# Patient Record
Sex: Female | Born: 1960 | Race: Black or African American | Hispanic: No | Marital: Married | Smoking: Never smoker
Health system: Southern US, Community
[De-identification: ages and names within clinical notes are randomized; demographics above are authoritative.]

## PROBLEM LIST (undated history)

## (undated) DIAGNOSIS — Z9221 Personal history of antineoplastic chemotherapy: Secondary | ICD-10-CM

## (undated) DIAGNOSIS — Z91010 Allergy to peanuts: Secondary | ICD-10-CM

## (undated) DIAGNOSIS — T7840XA Allergy, unspecified, initial encounter: Secondary | ICD-10-CM

## (undated) DIAGNOSIS — R7303 Prediabetes: Secondary | ICD-10-CM

## (undated) DIAGNOSIS — R600 Localized edema: Secondary | ICD-10-CM

## (undated) DIAGNOSIS — M25471 Effusion, right ankle: Secondary | ICD-10-CM

## (undated) DIAGNOSIS — C801 Malignant (primary) neoplasm, unspecified: Secondary | ICD-10-CM

## (undated) DIAGNOSIS — E739 Lactose intolerance, unspecified: Secondary | ICD-10-CM

## (undated) DIAGNOSIS — E559 Vitamin D deficiency, unspecified: Secondary | ICD-10-CM

## (undated) DIAGNOSIS — K5900 Constipation, unspecified: Secondary | ICD-10-CM

## (undated) HISTORY — DX: Allergy to peanuts: Z91.010

## (undated) HISTORY — DX: Effusion, right ankle: M25.471

## (undated) HISTORY — PX: BREAST SURGERY: SHX581

## (undated) HISTORY — DX: Allergy, unspecified, initial encounter: T78.40XA

## (undated) HISTORY — DX: Localized edema: R60.0

## (undated) HISTORY — DX: Prediabetes: R73.03

## (undated) HISTORY — DX: Lactose intolerance, unspecified: E73.9

## (undated) HISTORY — DX: Malignant (primary) neoplasm, unspecified: C80.1

## (undated) HISTORY — PX: MASTECTOMY: SHX3

## (undated) HISTORY — DX: Vitamin D deficiency, unspecified: E55.9

## (undated) HISTORY — DX: Constipation, unspecified: K59.00

---

## 1995-03-17 HISTORY — PX: MASTECTOMY: SHX3

## 1999-02-04 ENCOUNTER — Encounter: Payer: Self-pay | Admitting: Oncology

## 1999-02-04 ENCOUNTER — Encounter: Admission: RE | Admit: 1999-02-04 | Discharge: 1999-02-04 | Payer: Self-pay | Admitting: Oncology

## 1999-02-18 ENCOUNTER — Other Ambulatory Visit: Admission: RE | Admit: 1999-02-18 | Discharge: 1999-02-18 | Payer: Self-pay | Admitting: Obstetrics and Gynecology

## 2000-07-15 ENCOUNTER — Encounter: Payer: Self-pay | Admitting: Oncology

## 2000-07-15 ENCOUNTER — Encounter: Admission: RE | Admit: 2000-07-15 | Discharge: 2000-07-15 | Payer: Self-pay | Admitting: Oncology

## 2000-08-04 ENCOUNTER — Other Ambulatory Visit: Admission: RE | Admit: 2000-08-04 | Discharge: 2000-08-04 | Payer: Self-pay | Admitting: Obstetrics and Gynecology

## 2000-09-08 ENCOUNTER — Other Ambulatory Visit: Admission: RE | Admit: 2000-09-08 | Discharge: 2000-09-08 | Payer: Self-pay | Admitting: Obstetrics and Gynecology

## 2000-09-08 ENCOUNTER — Encounter (INDEPENDENT_AMBULATORY_CARE_PROVIDER_SITE_OTHER): Payer: Self-pay

## 2001-09-27 ENCOUNTER — Encounter: Admission: RE | Admit: 2001-09-27 | Discharge: 2001-09-27 | Payer: Self-pay | Admitting: Oncology

## 2001-09-27 ENCOUNTER — Encounter: Payer: Self-pay | Admitting: Oncology

## 2002-01-24 ENCOUNTER — Emergency Department (HOSPITAL_COMMUNITY): Admission: EM | Admit: 2002-01-24 | Discharge: 2002-01-24 | Payer: Self-pay | Admitting: Emergency Medicine

## 2002-10-02 ENCOUNTER — Encounter: Payer: Self-pay | Admitting: Oncology

## 2002-10-02 ENCOUNTER — Encounter: Admission: RE | Admit: 2002-10-02 | Discharge: 2002-10-02 | Payer: Self-pay | Admitting: Oncology

## 2003-10-29 ENCOUNTER — Encounter: Admission: RE | Admit: 2003-10-29 | Discharge: 2003-10-29 | Payer: Self-pay | Admitting: Internal Medicine

## 2003-11-12 ENCOUNTER — Encounter: Admission: RE | Admit: 2003-11-12 | Discharge: 2003-11-12 | Payer: Self-pay | Admitting: Internal Medicine

## 2004-03-21 ENCOUNTER — Encounter: Admission: RE | Admit: 2004-03-21 | Discharge: 2004-03-21 | Payer: Self-pay | Admitting: Internal Medicine

## 2005-03-25 ENCOUNTER — Encounter: Admission: RE | Admit: 2005-03-25 | Discharge: 2005-03-25 | Payer: Self-pay | Admitting: Internal Medicine

## 2005-04-01 ENCOUNTER — Encounter: Admission: RE | Admit: 2005-04-01 | Discharge: 2005-04-01 | Payer: Self-pay | Admitting: Internal Medicine

## 2006-12-22 ENCOUNTER — Encounter: Admission: RE | Admit: 2006-12-22 | Discharge: 2006-12-22 | Payer: Self-pay | Admitting: Internal Medicine

## 2007-01-17 ENCOUNTER — Encounter: Admission: RE | Admit: 2007-01-17 | Discharge: 2007-01-17 | Payer: Self-pay | Admitting: Internal Medicine

## 2007-04-27 ENCOUNTER — Encounter: Admission: RE | Admit: 2007-04-27 | Discharge: 2007-04-27 | Payer: Self-pay | Admitting: Internal Medicine

## 2008-10-02 ENCOUNTER — Encounter: Admission: RE | Admit: 2008-10-02 | Discharge: 2008-10-02 | Payer: Self-pay | Admitting: Internal Medicine

## 2009-10-14 ENCOUNTER — Encounter: Admission: RE | Admit: 2009-10-14 | Discharge: 2009-10-14 | Payer: Self-pay | Admitting: Internal Medicine

## 2010-04-06 ENCOUNTER — Encounter: Payer: Self-pay | Admitting: Internal Medicine

## 2010-04-06 ENCOUNTER — Encounter: Payer: Self-pay | Admitting: Obstetrics & Gynecology

## 2010-09-15 ENCOUNTER — Other Ambulatory Visit: Payer: Self-pay | Admitting: Obstetrics & Gynecology

## 2010-09-15 DIAGNOSIS — Z780 Asymptomatic menopausal state: Secondary | ICD-10-CM

## 2010-09-17 ENCOUNTER — Emergency Department (HOSPITAL_COMMUNITY): Payer: BC Managed Care – PPO

## 2010-09-17 ENCOUNTER — Emergency Department (HOSPITAL_COMMUNITY)
Admission: EM | Admit: 2010-09-17 | Discharge: 2010-09-17 | Disposition: A | Discharge status: Home / Self Care | Payer: BC Managed Care – PPO | Attending: Emergency Medicine | Admitting: Emergency Medicine

## 2010-09-17 DIAGNOSIS — R110 Nausea: Secondary | ICD-10-CM | POA: Insufficient documentation

## 2010-09-17 DIAGNOSIS — Z853 Personal history of malignant neoplasm of breast: Secondary | ICD-10-CM | POA: Insufficient documentation

## 2010-09-17 DIAGNOSIS — R079 Chest pain, unspecified: Secondary | ICD-10-CM | POA: Insufficient documentation

## 2010-09-17 LAB — POCT I-STAT, CHEM 8
Calcium, Ion: 1.19 mmol/L (ref 1.12–1.32)
Chloride: 108 mEq/L (ref 96–112)
Glucose, Bld: 86 mg/dL (ref 70–99)
Potassium: 5.2 mEq/L — ABNORMAL HIGH (ref 3.5–5.1)

## 2010-09-17 LAB — DIFFERENTIAL
Basophils Absolute: 0 10*3/uL (ref 0.0–0.1)
Eosinophils Absolute: 0.1 10*3/uL (ref 0.0–0.7)
Eosinophils Relative: 2 % (ref 0–5)
Lymphocytes Relative: 36 % (ref 12–46)
Lymphs Abs: 2.3 10*3/uL (ref 0.7–4.0)
Monocytes Absolute: 0.7 10*3/uL (ref 0.1–1.0)
Monocytes Relative: 10 % (ref 3–12)
Neutro Abs: 3.4 10*3/uL (ref 1.7–7.7)

## 2010-09-17 LAB — CBC
HCT: 39.8 % (ref 36.0–46.0)
Platelets: 237 10*3/uL (ref 150–400)
RBC: 4.39 MIL/uL (ref 3.87–5.11)
RDW: 13.2 % (ref 11.5–15.5)

## 2010-09-17 LAB — CK TOTAL AND CKMB (NOT AT ARMC)
CK, MB: 1.3 ng/mL (ref 0.3–4.0)
Total CK: 90 U/L (ref 7–177)

## 2010-09-19 ENCOUNTER — Ambulatory Visit (HOSPITAL_COMMUNITY)
Admission: RE | Admit: 2010-09-19 | Discharge: 2010-09-19 | Disposition: A | Discharge status: Home / Self Care | Payer: BC Managed Care – PPO | Source: Ambulatory Visit | Attending: Obstetrics & Gynecology | Admitting: Obstetrics & Gynecology

## 2010-09-19 DIAGNOSIS — Z13820 Encounter for screening for osteoporosis: Secondary | ICD-10-CM | POA: Insufficient documentation

## 2010-09-19 DIAGNOSIS — Z780 Asymptomatic menopausal state: Secondary | ICD-10-CM

## 2010-09-22 ENCOUNTER — Other Ambulatory Visit: Payer: Self-pay | Admitting: Internal Medicine

## 2010-09-22 DIAGNOSIS — Z1231 Encounter for screening mammogram for malignant neoplasm of breast: Secondary | ICD-10-CM

## 2010-09-22 DIAGNOSIS — Z9012 Acquired absence of left breast and nipple: Secondary | ICD-10-CM

## 2010-10-17 ENCOUNTER — Ambulatory Visit: Payer: BC Managed Care – PPO

## 2010-10-24 ENCOUNTER — Ambulatory Visit
Admission: RE | Admit: 2010-10-24 | Discharge: 2010-10-24 | Disposition: A | Discharge status: Home / Self Care | Payer: BC Managed Care – PPO | Source: Ambulatory Visit | Attending: Internal Medicine | Admitting: Internal Medicine

## 2010-10-24 DIAGNOSIS — Z1231 Encounter for screening mammogram for malignant neoplasm of breast: Secondary | ICD-10-CM

## 2010-10-24 DIAGNOSIS — Z9012 Acquired absence of left breast and nipple: Secondary | ICD-10-CM

## 2011-10-15 ENCOUNTER — Other Ambulatory Visit: Payer: Self-pay | Admitting: Internal Medicine

## 2011-10-15 DIAGNOSIS — Z9012 Acquired absence of left breast and nipple: Secondary | ICD-10-CM

## 2011-10-15 DIAGNOSIS — Z1231 Encounter for screening mammogram for malignant neoplasm of breast: Secondary | ICD-10-CM

## 2011-11-03 ENCOUNTER — Ambulatory Visit: Payer: BC Managed Care – PPO

## 2011-11-13 ENCOUNTER — Ambulatory Visit
Admission: RE | Admit: 2011-11-13 | Discharge: 2011-11-13 | Disposition: A | Discharge status: Home / Self Care | Payer: BC Managed Care – PPO | Source: Ambulatory Visit | Attending: Internal Medicine | Admitting: Internal Medicine

## 2011-11-13 DIAGNOSIS — Z1231 Encounter for screening mammogram for malignant neoplasm of breast: Secondary | ICD-10-CM

## 2011-11-13 DIAGNOSIS — Z9012 Acquired absence of left breast and nipple: Secondary | ICD-10-CM

## 2012-12-28 ENCOUNTER — Ambulatory Visit: Payer: Self-pay | Admitting: Obstetrics & Gynecology

## 2013-05-10 IMAGING — CR DG CHEST 2V
2 series · 2 of 2 positions shown · non-contrast
Comparison: None.

CLINICAL DATA: Chest pain

CHEST - 2 VIEW

[w chest pa]
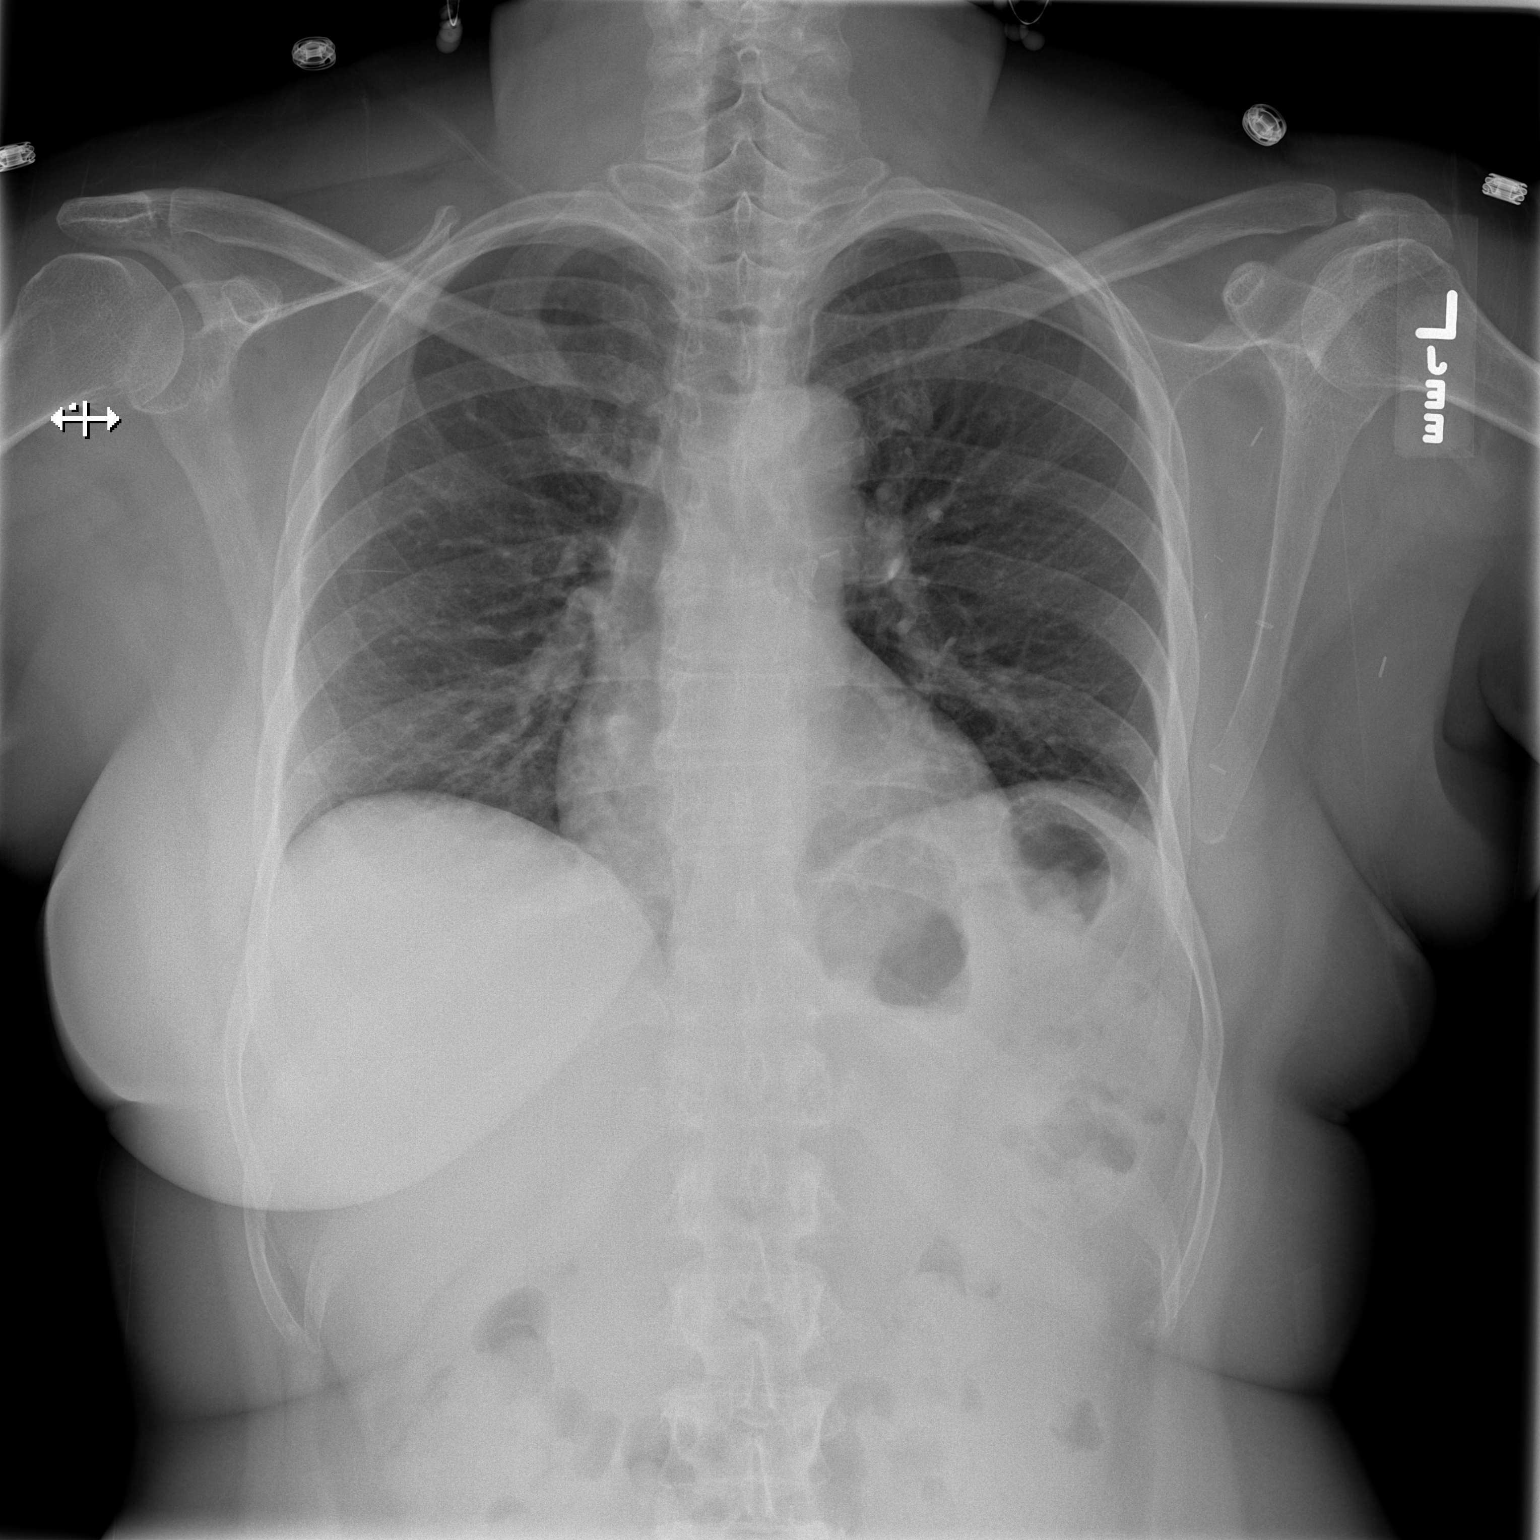

[w chest lat]
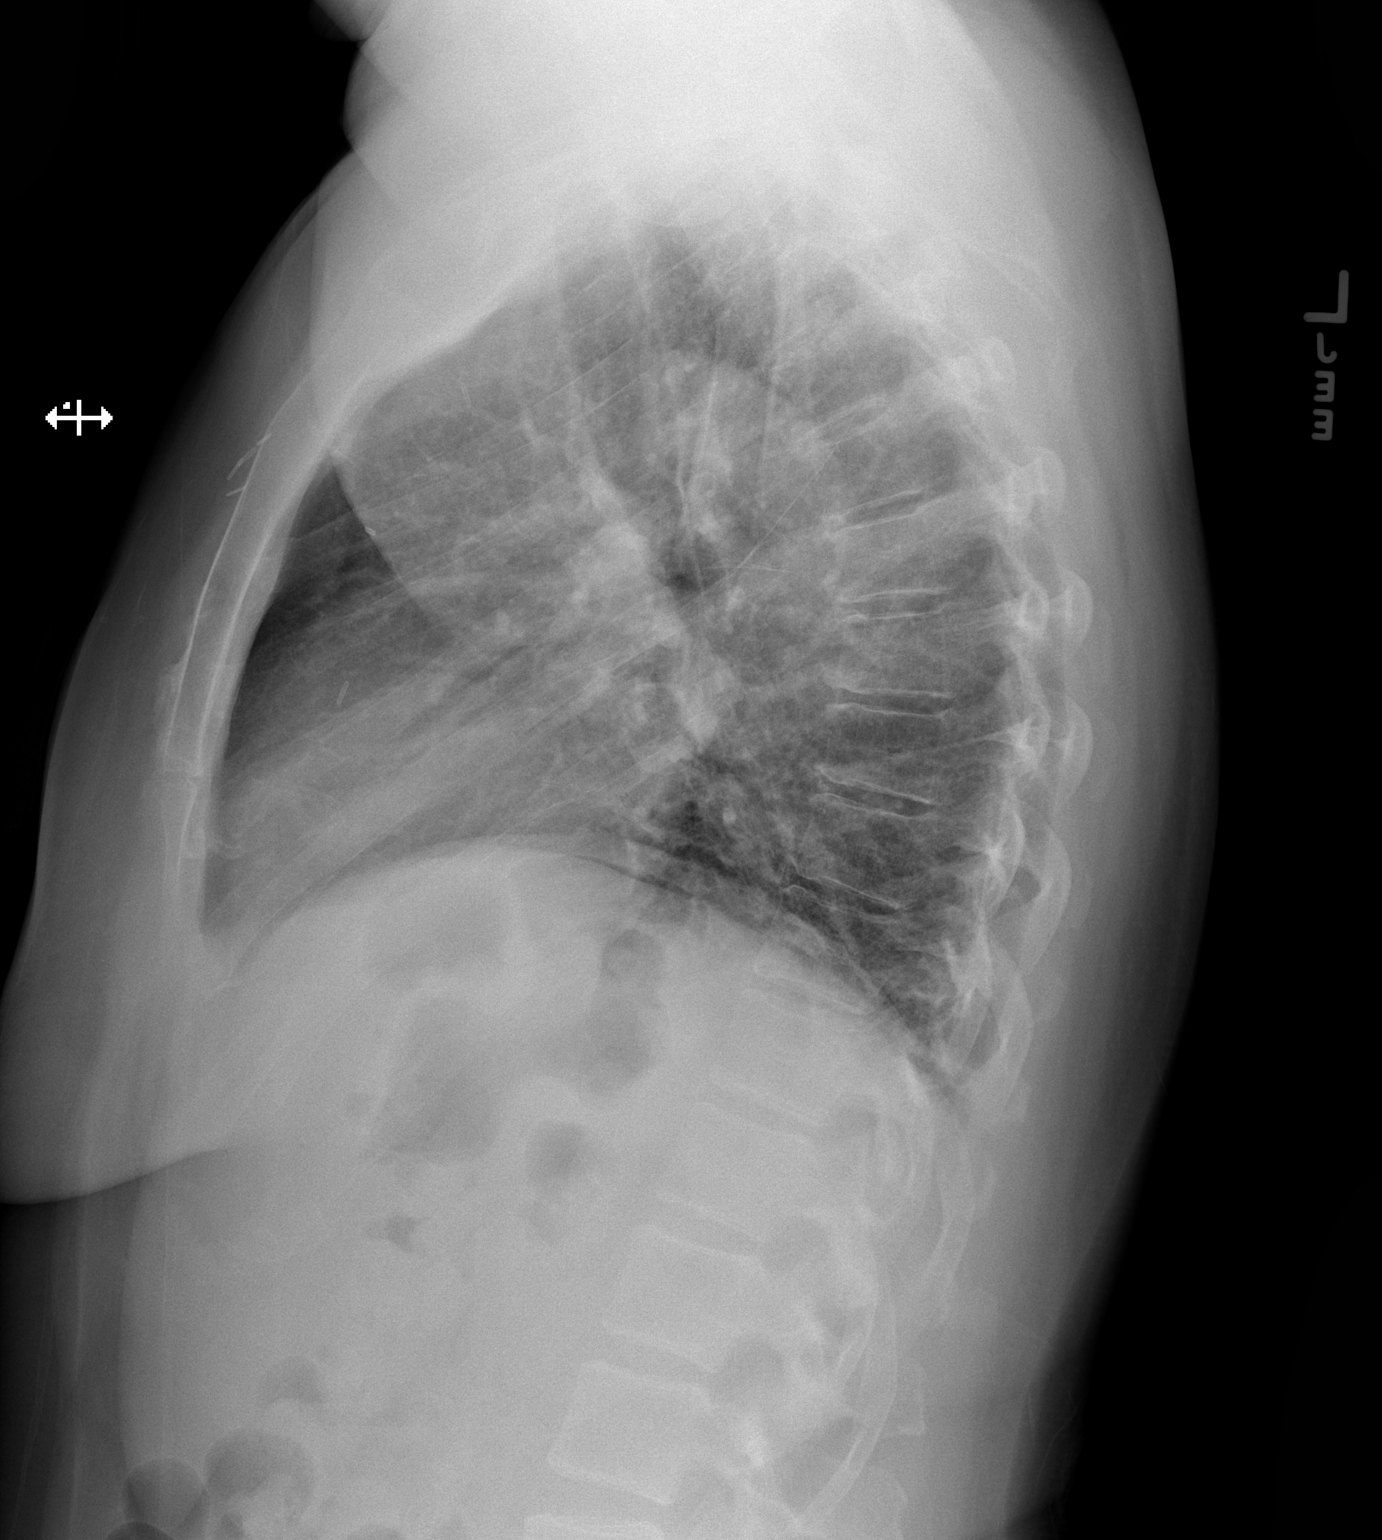

[2 of 2 positions shown; findings below may reference images not displayed]

FINDINGS: The patient is status post left mastectomy. The lungs are
clear without focal infiltrate, edema, pneumothorax or pleural
effusion. Cardiopericardial silhouette is at upper limits of normal
for size. Imaged bony structures of the thorax are intact.
IMPRESSION: There are no acute cardiopulmonary process.

## 2013-07-13 ENCOUNTER — Other Ambulatory Visit: Payer: Self-pay

## 2013-07-13 DIAGNOSIS — Z9012 Acquired absence of left breast and nipple: Secondary | ICD-10-CM

## 2013-07-13 DIAGNOSIS — Z1231 Encounter for screening mammogram for malignant neoplasm of breast: Secondary | ICD-10-CM

## 2013-07-18 ENCOUNTER — Ambulatory Visit
Admission: RE | Admit: 2013-07-18 | Discharge: 2013-07-18 | Disposition: A | Discharge status: Home / Self Care | Payer: BC Managed Care – PPO | Source: Ambulatory Visit

## 2013-07-18 ENCOUNTER — Encounter (INDEPENDENT_AMBULATORY_CARE_PROVIDER_SITE_OTHER): Payer: Self-pay

## 2013-07-18 DIAGNOSIS — Z1231 Encounter for screening mammogram for malignant neoplasm of breast: Secondary | ICD-10-CM

## 2013-07-18 DIAGNOSIS — Z9012 Acquired absence of left breast and nipple: Secondary | ICD-10-CM

## 2013-08-11 ENCOUNTER — Ambulatory Visit (INDEPENDENT_AMBULATORY_CARE_PROVIDER_SITE_OTHER): Payer: BC Managed Care – PPO | Admitting: Physician Assistant

## 2013-08-11 VITALS — BP 126/84 | HR 78 | Temp 98.3°F | Resp 16 | Ht 64.0 in | Wt 183.6 lb

## 2013-08-11 DIAGNOSIS — Z23 Encounter for immunization: Secondary | ICD-10-CM

## 2013-08-11 DIAGNOSIS — Z7189 Other specified counseling: Secondary | ICD-10-CM

## 2013-08-11 DIAGNOSIS — IMO0002 Reserved for concepts with insufficient information to code with codable children: Secondary | ICD-10-CM

## 2013-08-11 MED ORDER — TYPHOID VACCINE PO CPDR: 1.0000 | DELAYED_RELEASE_CAPSULE | ORAL | Status: DC

## 2013-08-11 NOTE — Progress Notes (Signed)
   Subjective:    Patient ID: Gloria Chen, female    DOB: 09-27-1960, 53 y.o.   MRN: [id number]  HPI   Gloria Chen is a very pleasant 53 yr old female here for travel immunizations.  She leaves for [place] on 6/13.  Traveling for work as a Pharmacist, hospital.  She does not have any immunization records with her.  She spoke with her PCP who recommended hep A and Tdap.  She is unsure about Heb B imm status but would like to begin this series.  She also agrees to the typhoid vaccine  She feels well today.  In her usual state of health.  No prior vaccine reactions.  Review of Systems  Constitutional: Negative.   Respiratory: Negative.   Cardiovascular: Negative.   Gastrointestinal: Negative.        Objective:   Physical Exam  Vitals reviewed. Constitutional: She is oriented to person, place, and time. She appears well-developed and well-nourished. No distress.  HENT:  Head: Normocephalic and atraumatic.  Eyes: Conjunctivae are normal. No scleral icterus.  Cardiovascular: Normal rate, regular rhythm and normal heart sounds.   Pulmonary/Chest: Effort normal and breath sounds normal. She has no wheezes. She has no rales.  Abdominal: Soft. There is no tenderness.  Neurological: She is alert and oriented to person, place, and time.  Skin: Skin is warm and dry.  Psychiatric: She has a normal mood and affect. Her behavior is normal.       Assessment & Plan:  Advice or immunization for travel - Plan: Hepatitis A vaccine adult IM, Hepatitis B vaccine adult IM, typhoid (VIVOTIF) DR capsule, Tdap vaccine greater than or equal to 7yo IM  Need for prophylactic vaccination and inoculation against viral hepatitis - Plan: Hepatitis A vaccine adult IM, Hepatitis B vaccine adult IM  Need for immunization against typhoid - Plan: typhoid (VIVOTIF) DR capsule  Need for Tdap vaccination - Plan: Tdap vaccine greater than or equal to 7yo IM   Gloria Chen is a very pleasant 53 yr old female here to discuss  immunizations prior to travel.  We have administered Tdap, Hep A, and first dose of Hep B.  Pt to return to complete Hep B series in 1 month and 6 months.  Encouraged her to get Hep A booster in 6-12 months.  Typhoid vaccine sent to pharmacy - encourage pt to start today so that she can be done 1 wk prior to travel which ensures the best protection.    Per CDC travel recommendations pt does not need malaria ppx or yellow fever vaccination   E. Natividad Brood MHS, PA-C [facility] 5/29/20155:16 PM

## 2013-08-11 NOTE — Patient Instructions (Signed)
Hepatitis A vaccine - first dose given today.  It is recommended that you get a booster in 6-12 months  Hepatitis B vaccine - first dose given today. This a 3 dose series.  Your second dose will be due in 1 month, and then the last dose will be in 6 months.  (You will not need an office visit for these, you can come in for immunization only)  Tdap - this offers protection for tetanus, diphtheria, and also boosts pertussis (whooping cough) immunity.  This lasts for 10 yrs (unless you have an injury like a cut, in which case we may want to boost it sooner)  Typhoid - take the Vivotif (typhoid vaccine) every other day for 4 doses.  I would start this today as you will have the best protection if it is completed 1 full week prior to travel.  This medicine has to stay refrigerated.   Traveling Outside the [place]  See your doctor at least 4 - 6 weeks before your trip. This allows time for immunizations to take effect. If it is less than 4 weeks before you leave, you should still see your caregiver. You might still benefit from shots or medicines and information about how to protect yourself while traveling.  Your caregiver will ask you where you intend to travel, how long you intend to stay, and whether you may visit rural areas. This determines what vaccinations should be considered. Know your travel schedule when you visit your caregiver.  Adolescents and children should seek guidance on their vaccination status from their caregiver. So should women who are breastfeeding or pregnant, and people with altered immunity (HIV/AIDS, diabetes). CDC RECOMMENDS THE FOLLOWING VACCINES (AS NEEDED BY AGE AND BY WORLD REGION):  Routine Vaccines: Be up to date on your routine vaccinations. Get boosters, if needed. These include diphtheria, tetanus, and pertussis (DPT), measles, mumps, and rubella (MMR), influenza (flu), and varicella (chickenpox). Also, meningococcal, if you are 37 to 51 years of age, and zoster  (shingles) if over age 52. Possibly pneumococcal, if you are a smoker or have long-term (chronic) lung or heart disease.  Typhoid: If you are visiting low income or developing countries.  Yellow Fever: If traveling to an area where the disease is prevalent (endemic).  HPV: (Human Papilloma Virus), if you are 58 years old or younger and intend to be sexually active.  Rabies: If you might be exposed to wild or domestic animals. Pre-exposure rabies vaccine is urged for people doing more than short-term travel in countries where rabies is common (including [place]).  Polio: A single one-time booster is advised for travel to [place] and [place].  Hepatitis A: This is routinely given to children beginning at age 82 years. It is often advised for most foreign travel, including [place].  Hepatitis B: This is given routinely to infants, children, and adolescents.  Meningococcal: This is advised for travel to developing countries, where risk is high. For example, parts of sub-Saharan [place] ("meningitis belt"). [place] requires vaccine for all pilgrims attending the Hajj (religious travel to [place]).  Malaria: A vaccine does not yet exist. Oral medicines can prevent the usual types of malaria and drug-resistant strains. The most common medicine prescribed is LARIAM (mefloquine). It is taken once weekly before, during, and after travel. Other drugs are also used for malaria prevention, including chloroquine and doxycycline.  Japanese B Encephalitis (JE): This is a moderately toxic vaccine. Use is generally limited to travelers to [place], who will have long rural  exposure to mosquitoes, in areas with high likelihood of disease spreading (such as, rice paddies). TO STAY HEALTHY, DO:  Wash hands often, with soap and water.  Drink only bottled or boiled water, or carbonated (bubbly) drinks in cans or bottles. Avoid tap water, fountain drinks, and ice cubes. If not possible, make water safer by BOTH  filtering through an "absolute 1-micron or less" filter AND adding iodine tablets. Such filters are found in camping and outdoor supply stores.  When buying carbonated drinks or bottled water, always inspect the bottle seal. Make sure it has not been previously opened. This could mean it was refilled with unclean beverages or water. If you suspect a bottle seal has been tampered with, return or discard it.  Eat only thoroughly cooked food from a reputable restaurant or food service provider, who routinely caters to foreign travelers. Only eat meat, fish, or shellfish that have been thoroughly cooked. Otherwise, they can infect you and cause gastroenteritis.  Avoid foods that have been prepared and left standing at room temperature. These often support bacterial growth that can make you ill.  If you will be visiting an area where there is risk for malaria, take your malaria prevention medicine before, during, and after travel, as directed. (See your doctor for a prescription.)  Protect yourself from mosquito bites:  Pay special attention to mosquito protection between dusk and dawn. This is when malaria-carrying mosquitos are active.  Wear long-sleeved shirts, long pants, and hats.  Use insect repellants that contain DEET (diethylmethyltoluamide).  Read and follow the directions and precautions on the product label.  Apply insect repellent to all exposed skin.  Do not put repellent on wounds or broken skin.  Do not breathe in, swallow, or get DEET in your eyes. DEET is toxic if swallowed. If using a spray product, apply DEET to your face by spraying your hands and rubbing the product carefully over the face. Avoid your eyes and mouth.  Purchase a bed net impregnated (treated) with the insecticide permethrin or deltamethrin. Or, spray the bed net with one of these insecticides. This is not needed if you are staying in air-conditioned or well-screened housing.  DEET may be used on adults,  children, and infants older than 25 months of age. Protect infants by using a carrier draped with mosquito netting, with an elastic edge for a tight fit.  Children under 41 years old should not apply insect repellent themselves. Do not apply to young children's hands or around their eyes and mouth.  If you are visiting areas where malaria occurs, read the malaria prevention recommendations on the CDC malaria website ([employer]). Your caregiver will guide you on the selection and use of an anti-malaria preventive medicine that you may need to take before, during, and after your visit.  To prevent fungal and parasitic infections, keep feet clean and dry. Do not go barefoot.  Always use condoms to reduce the risk of HIV and other sexually transmitted diseases.  Try to travel in vehicles that have seat belts, whenever possible. If renting a vehicle, try to rent a larger one for added protection. Wear helmets whenever bicycling or motorcycling. Avoid alcohol when operating any vehicle, even a bicycle. Avoid overcrowded, over-weighted, or top heavy buses or mini-vans. Be aware that pedestrian patterns vary greatly by country. TO AVOID GETTING SICK:  Do not eat food purchased from street vendors.  Eat at restaurants that often cater to foreign travelers (leading hotels, hotel chains).  Do not drink beverages with  ice.  Do not eat dairy products, unless you know they have been pasteurized.  Do not share needles with anyone.  Do not handle animals (especially monkeys, dogs, and cats). Avoid bites and serious diseases (including rabies and plague).  Do not swim in fresh water. Salt water is often safer. Avoid swimming pools that are not chlorinated.  Do not have unprotected sex. WHAT YOU NEED TO BRING WITH YOU:  Long-sleeved shirt, long pants, and a hat to wear outside. This is to prevent illnesses carried by insects (malaria, dengue, filariasis, leishmaniasis,  onchocerciasis).  Contact information card, for use in urgent situations. This should list the names, addresses, and telephone numbers of family member(s) or contact(s) in your country, your primary caregivers, important specialty home caregivers, area hospitals and clinics where you will travel, and your national consulate or embassy.  Purchase a pre-packaged travel health kit from a reputable source, or create one yourself. This should include a first aid kit and commonly needed medicines. ITEMS TO INCLUDE IN A TRAVEL HEALTH KIT:  Insect repellent containing DEET.  Bed nets impregnated with permethrin. (Can be purchased in camping or [employer] supply stores. Overseas, permethrin or another insecticide, deltamethrin, may be purchased to treat bed nets and clothes.)  Flying-insect spray or mosquito coils, to help clear rooms of mosquitoes. The product should contain a pyrethroid insecticide. These insecticides quickly kill flying insects, including mosquitoes.  Over-the-counter anti-diarrhea medicine, to take if you have diarrhea.  Iodine tablets and water filters to purify water, if bottled water is not available.  Sunblock and sunglasses.  Antibacterial hand wipes or an alcohol-based hand sanitizer. Must contain at least 60% alcohol.  Extra pair of contacts or prescription glasses, or both, for people who wear corrective lenses.  Prescription medicines. Make sure you have enough to last during your trip, as well as a copy of the prescription(s).  Destination-related medicines, if applicable:  Anti-malaria medicines.  Medicine to prevent or treat high-altitude illness.  Pain or fever medicines (acetaminophen, aspirin, ibuprofen).  Stomach upset or diarrhea medicines:  Over-the-counter anti-diarrhea medicine (loperamide, bismuth subsalicylate).  Antibiotic for self-treatment of moderate to severe diarrhea.  Oral rehydration solution packets.  Mild  laxative.  Antacid.  Items to treat throat and respiratory symptoms:  Antihistamine.  Decongestant, alone or combined with antihistamine.  Cough suppressant or expectorant (promotes the expulsion of mucus).  Throat lozenges.  Anti-motion sickness medicine.  Epinephrine auto-injector (such as an EpiPen), if you have a history of severe allergic reaction. Smaller dose packages are available for children.  Any medicines, prescription or over-the-counter, taken on a regular basis at home. For Basic First Aid  Disposable gloves (at least two pairs).  Adhesive bandages, multiple sizes.  Gauze.  Adhesive tape.  Elastic bandage wrap for sprains and strains.  Antiseptic.  Cotton swabs.  Tweezers.  Scissors.  Antifungal and antibacterial ointments or creams.  1% hydrocortisone cream.  Anti-itch gel or cream, for insect bites and stings.  Aloe gel for sunburns.  Moleskin or molefoam for blisters.  Digital thermometer.  Saline eye drops.  First-aid quick reference card.  Commercial suture and syringe kits, to be used by a local caregiver. (These items will also require a letter from the prescribing physician, on official letterhead stationery.) Note that some of the above items are considered sharp. They will need to be packed in your checked luggage, not in a carry on.  AFTER YOU RETURN HOME: If you have visited a malaria risk area, continue taking your anti-malaria  drug for 4 weeks (chloroquine, doxycycline, or mefloquine) or 7 days (atovaquone/proguanil) after leaving the risk area. Malaria is always a serious disease and may be a deadly illness. If you become ill with a fever or flu-like illness, while traveling or after you return home (for up to 1 year), you should seek immediate medical attention. Tell the caregiver your travel history. This information is courtesy of the Center for Disease Control (CDC).  Document Released: 02/12/2004 Document Revised:  05/25/2011 Document Reviewed: 12/31/2008 [facility] Patient Information 2014 [place].

## 2013-08-21 ENCOUNTER — Telehealth: Payer: Self-pay | Admitting: Physician Assistant

## 2013-08-21 DIAGNOSIS — IMO0002 Reserved for concepts with insufficient information to code with codable children: Secondary | ICD-10-CM

## 2013-08-21 DIAGNOSIS — Z23 Encounter for immunization: Secondary | ICD-10-CM

## 2013-08-21 NOTE — Telephone Encounter (Signed)
Patient has a question about her vaccinations. Patients says it is urgent to get a call back since she is leaving to go abroad on Saturday.  [phone] or [phone] ext. [place]

## 2013-08-22 MED ORDER — TYPHOID VACCINE PO CPDR: 1.0000 | DELAYED_RELEASE_CAPSULE | ORAL | Status: DC

## 2013-08-22 NOTE — Telephone Encounter (Signed)
Pt states on the last day of taking the Typhoid medication pt became ill and vomited the medication out. She is wondering if she should have another pill prescribed or if the 3 days will be enough.   Ok to leave message on voicemail.

## 2013-08-22 NOTE — Telephone Encounter (Signed)
The typhoid vaccine is most effective when completed one full week prior to travel, so she will not have full protection by the time she travels.  However, some protection is better than no protection.  As far as the missed dose, I think she will be ok to just repeat the last dose.  I have sent an additional dose of the vaccine to her pharmacy.  However, I think that the vaccine comes pre-packaged as 4 doses, so it's possible that the pharmacy may not be able to give her just one pill and she may have to buy all four doses - but I can't say for sure that this will be the case

## 2013-08-22 NOTE — Telephone Encounter (Signed)
Spoke to pt- she is not going to take the final dose- pharmacy does require the 4 pills to be purchased together.

## 2013-10-18 ENCOUNTER — Ambulatory Visit (INDEPENDENT_AMBULATORY_CARE_PROVIDER_SITE_OTHER): Payer: BC Managed Care – PPO | Admitting: Obstetrics & Gynecology

## 2013-10-18 ENCOUNTER — Encounter: Payer: Self-pay | Admitting: Obstetrics & Gynecology

## 2013-10-18 VITALS — BP 138/94 | HR 80 | Temp 98.1°F | Ht 63.0 in | Wt 185.0 lb

## 2013-10-18 DIAGNOSIS — Z0000 Encounter for general adult medical examination without abnormal findings: Secondary | ICD-10-CM

## 2013-10-18 DIAGNOSIS — Z01419 Encounter for gynecological examination (general) (routine) without abnormal findings: Secondary | ICD-10-CM

## 2013-10-18 DIAGNOSIS — B373 Candidiasis of vulva and vagina: Secondary | ICD-10-CM

## 2013-10-18 DIAGNOSIS — B3731 Acute candidiasis of vulva and vagina: Secondary | ICD-10-CM

## 2013-10-18 LAB — POCT WET PREP (WET MOUNT)
Clue Cells Wet Prep Whiff POC: NEGATIVE
KOH Wet Prep POC: NEGATIVE

## 2013-10-18 MED ORDER — FLUCONAZOLE 150 MG PO TABS: 150.0000 mg | ORAL_TABLET | ORAL | Status: DC

## 2013-10-18 NOTE — Patient Instructions (Signed)

## 2013-10-18 NOTE — Progress Notes (Signed)
Subjective:     Gloria Chen is a 53 y.o. female here for a routine exam.  Current complaints: none.    Personal health questionnaire:  Is patient Ashkenazi Jewish, have a family history of breast and/or ovarian cancer: yes--personal h/o breast cancer Is there a family history of uterine cancer diagnosed at age < 12, gastrointestinal cancer, urinary tract cancer, family member who is a Field seismologist syndrome-associated carrier: no Is the patient overweight and hypertensive, family history of diabetes, personal history of gestational diabetes or PCOS: no Is patient over 53, have PCOS,  family history of premature CHD under age 109, diabetes, smoke, have hypertension or peripheral artery disease:  no At any time, has a partner hit, kicked or otherwise hurt or frightened you?: no Over the past 2 weeks, have you felt down, depressed or hopeless?: no Over the past 2 weeks, have you felt little interest or pleasure in doing things?:no   Gynecologic History No LMP recorded. Patient is postmenopausal.  Last Pap results were: normal   Obstetric History OB History  Gravida Para Term Preterm AB SAB TAB Ectopic Multiple Living  2 1 1  1     1     # Outcome Date GA Lbr Len/2nd Weight Sex Delivery Anes PTL Lv  2 TRM         Y  1 ABT               Past Medical History  Diagnosis Date  . Allergy   . Cancer     Past Surgical History  Procedure Laterality Date  . Breast surgery      Current outpatient prescriptions:fluconazole (DIFLUCAN) 150 MG tablet, Take 1 tablet (150 mg total) by mouth every other day., Disp: 2 tablet, Rfl: 0 No Known Allergies  History  Substance Use Topics  . Smoking status: Never Smoker   . Smokeless tobacco: Not on file  . Alcohol Use: No    Family History  Problem Relation Age of Onset  . Hypertension Mother   . Heart disease Father   . Hyperlipidemia Father   . Hypertension Father   . Stroke Father   . Hypertension Sister   . Diabetes Sister   . Hypertension  Brother       Review of Systems  Constitutional: negative for fatigue and weight loss Respiratory: negative for cough and wheezing Cardiovascular: negative for chest pain, fatigue and palpitations Gastrointestinal: negative for abdominal pain and change in bowel habits Musculoskeletal:negative for myalgias Neurological: negative for gait problems and tremors Behavioral/Psych: negative for abusive relationship, depression Endocrine: negative for temperature intolerance   Genitourinary:negative for genital lesions, hot flashes, sexual problems and vaginal discharge Integument/breast: negative for breast lump, breast tenderness, nipple discharge and skin lesion(s)    Objective:       BP 138/94  Pulse 80  Temp(Src) 98.1 F (36.7 C)  Ht 5\' 3"  (1.6 m)  Wt 83.915 kg (185 lb)  BMI 32.78 kg/m2 General:   alert  Skin:   no rash or abnormalities  Lungs:   clear to auscultation bilaterally  Heart:   regular rate and rhythm, S1, S2 normal, no murmur, click, rub or gallop  Abdomen:  normal findings: no organomegaly, soft, non-tender and no hernia  Pelvis:  External genitalia: normal general appearance Urinary system: urethral meatus normal and bladder without fullness, nontender Vaginal: normal without tenderness, induration or masses; white discharge present Cervix: normal appearance Adnexa: normal bimanual exam Uterus: anteverted and non-tender, normal size   Lab  Review  Labs reviewed no Radiologic studies reviewed no    Assessment:    Healthy female exam.  Likely candida vulvovaginitis   Plan:    Education reviewed: calcium supplements, low fat, low cholesterol diet and weight bearing exercise.   Meds ordered this encounter  Medications  . fluconazole (DIFLUCAN) 150 MG tablet    Sig: Take 1 tablet (150 mg total) by mouth every other day.    Dispense:  2 tablet    Refill:  0   Orders Placed This Encounter  Procedures  . WET PREP BY MOLECULAR PROBE    Follow up  as needed.

## 2013-10-19 LAB — PAP IG AND HPV HIGH-RISK: HPV DNA High Risk: NOT DETECTED

## 2013-10-19 LAB — WET PREP BY MOLECULAR PROBE
Candida species: NEGATIVE
Gardnerella vaginalis: NEGATIVE
Trichomonas vaginosis: NEGATIVE

## 2014-01-15 ENCOUNTER — Encounter: Payer: Self-pay | Admitting: Obstetrics & Gynecology

## 2014-03-12 ENCOUNTER — Encounter: Payer: Self-pay | Admitting: *Deleted

## 2014-03-13 ENCOUNTER — Encounter: Payer: Self-pay | Admitting: Obstetrics & Gynecology

## 2014-10-17 ENCOUNTER — Ambulatory Visit (INDEPENDENT_AMBULATORY_CARE_PROVIDER_SITE_OTHER): Payer: BC Managed Care – PPO | Admitting: Physician Assistant

## 2014-10-17 DIAGNOSIS — L089 Local infection of the skin and subcutaneous tissue, unspecified: Secondary | ICD-10-CM

## 2014-10-17 DIAGNOSIS — L723 Sebaceous cyst: Secondary | ICD-10-CM | POA: Diagnosis not present

## 2014-10-17 DIAGNOSIS — Z853 Personal history of malignant neoplasm of breast: Secondary | ICD-10-CM | POA: Insufficient documentation

## 2014-10-17 MED ORDER — DOXYCYCLINE HYCLATE 100 MG PO TABS: 100.0000 mg | ORAL_TABLET | Freq: Two times a day (BID) | ORAL | Status: DC

## 2014-10-17 NOTE — Patient Instructions (Signed)
Warm compresses to the area.  Change the dressing everyday.  Recheck with me on Friday.

## 2014-10-17 NOTE — Progress Notes (Signed)
   Gloria Chen  MRN: [id number] DOB: 1961/01/11  Subjective:  Pt presents to clinic with an enlarging tender bump on her left inner thigh. She has had a small bump in that area for quite a while and then about a week ago she noticed that it was getting bigger and becoming painful.  She has had a sebaceous cyst removed in the past but it never hurt.  She overall feels fine.  Patient Active Problem List   Diagnosis Date Noted  . History of breast cancer in female 10/17/2014    No current outpatient prescriptions on file prior to visit.   No current facility-administered medications on file prior to visit.   No Known Allergies  Review of Systems  Constitutional: Negative for fever and chills.  Skin: Positive for wound.   Objective:  BP 122/76 mmHg  Pulse 89  Temp(Src) 98.7 F (37.1 C) (Oral)  Resp 16  Ht 5' 4.5" (1.638 m)  Wt 181 lb 3.2 oz (82.192 kg)  BMI 30.63 kg/m2  SpO2 98%  Physical Exam  Constitutional: She is oriented to person, place, and time and well-developed, well-nourished, and in no distress.  HENT:  Head: Normocephalic and atraumatic.  Right Ear: Hearing and external ear normal.  Left Ear: Hearing and external ear normal.  Eyes: Conjunctivae are normal.  Neck: Normal range of motion.  Pulmonary/Chest: Effort normal.  Neurological: She is alert and oriented to person, place, and time. Gait normal.  Skin: Skin is warm and dry.     Psychiatric: Mood, memory, affect and judgment normal.  Vitals reviewed.  Procedure:  Consent obtained.  Local anesthesia with 2% lido with epi.  Betadine prep. #11 blade used to make 2cm incision - purulent malodorous sebaceous material expressed - cyst capsule expressed from the wound cavity.  Wound cavity was packed with with 1/4 in plain packing - drsg placed.  Assessment and Plan :  Infected sebaceous cyst - Plan: doxycycline (VIBRA-TABS) 100 MG tablet   Wound care was d/w pt.  Her questions were answered.  After the  procedure she felt much better.  Windell Hummingbird PA-C  [facility] 10/17/2014 11:28 AM

## 2014-10-19 ENCOUNTER — Ambulatory Visit (INDEPENDENT_AMBULATORY_CARE_PROVIDER_SITE_OTHER): Payer: BC Managed Care – PPO | Admitting: Physician Assistant

## 2014-10-19 VITALS — BP 112/72 | HR 74 | Temp 98.1°F | Resp 18 | Wt 183.0 lb

## 2014-10-19 DIAGNOSIS — L723 Sebaceous cyst: Secondary | ICD-10-CM

## 2014-10-19 DIAGNOSIS — L089 Local infection of the skin and subcutaneous tissue, unspecified: Secondary | ICD-10-CM

## 2014-10-19 NOTE — Progress Notes (Signed)
   Gloria Chen  MRN: [id number] DOB: 04/07/1960  Subjective:  Pt presents to clinic for a wound recheck. She has been taking her abx as Rx and not having any problems with it.  She has been changing the drsg daily.  Quite a bit of drainage from the area.    Patient Active Problem List   Diagnosis Date Noted  . History of breast cancer in female 10/17/2014    Current Outpatient Prescriptions on File Prior to Visit  Medication Sig Dispense Refill  . doxycycline (VIBRA-TABS) 100 MG tablet Take 1 tablet (100 mg total) by mouth 2 (two) times daily. 20 tablet 0   No current facility-administered medications on file prior to visit.    No Known Allergies  Review of Systems Objective:  BP 112/72 mmHg  Pulse 74  Temp(Src) 98.1 F (36.7 C) (Oral)  Resp 18  Wt 183 lb (83.008 kg)  SpO2 96%  Physical Exam  Constitutional: She is oriented to person, place, and time and well-developed, well-nourished, and in no distress.  HENT:  Head: Normocephalic and atraumatic.  Right Ear: Hearing and external ear normal.  Left Ear: Hearing and external ear normal.  Eyes: Conjunctivae are normal.  Neck: Normal range of motion.  Pulmonary/Chest: Effort normal.  Neurological: She is alert and oriented to person, place, and time. Gait normal.  Skin: Skin is warm and dry.  Drsg and packing removed.  Wound cavity is health appearing with good granulation tissue.  The induration is reduced as is the erythema - the induration is now about 3 cm in diameter.  No purulence was expressed. Irrigated with lido with epi and then repacked with plain packing.  Drsg placed.  Psychiatric: Mood, memory, affect and judgment normal.  Vitals reviewed.  Assessment and Plan :  Infected sebaceous cyst   Pt to continue the abx.  She will pull about 1 in of packing out in 2 days and then f/u with me in 5 days.    Windell Hummingbird PA-C  [facility] 10/19/2014 5:06 PM

## 2014-10-19 NOTE — Patient Instructions (Signed)
Daily dressing change Pull a small amount about an inch of the packing out in 2 days and then I will see you on Wed or Thursday

## 2014-10-22 ENCOUNTER — Ambulatory Visit: Payer: BC Managed Care – PPO | Admitting: Obstetrics & Gynecology

## 2014-10-25 ENCOUNTER — Ambulatory Visit (INDEPENDENT_AMBULATORY_CARE_PROVIDER_SITE_OTHER): Payer: BC Managed Care – PPO | Admitting: Physician Assistant

## 2014-10-25 ENCOUNTER — Ambulatory Visit: Payer: BC Managed Care – PPO | Admitting: Certified Nurse Midwife

## 2014-10-25 VITALS — BP 116/74 | HR 84 | Temp 97.7°F | Resp 14 | Ht 64.5 in | Wt 184.0 lb

## 2014-10-25 DIAGNOSIS — L089 Local infection of the skin and subcutaneous tissue, unspecified: Secondary | ICD-10-CM

## 2014-10-25 DIAGNOSIS — L723 Sebaceous cyst: Secondary | ICD-10-CM

## 2014-10-25 NOTE — Progress Notes (Signed)
   Gloria Chen  MRN: [id number] DOB: Sep 25, 1960  Subjective:  Pt presents to clinic for a wound recheck.  She has been changing the drsg daily.  She is tolerating the abx ok and she is having no problems.  Patient Active Problem List   Diagnosis Date Noted  . History of breast cancer in female 10/17/2014    Current Outpatient Prescriptions on File Prior to Visit  Medication Sig Dispense Refill  . doxycycline (VIBRA-TABS) 100 MG tablet Take 1 tablet (100 mg total) by mouth 2 (two) times daily. 20 tablet 0   No current facility-administered medications on file prior to visit.    No Known Allergies  Review of Systems  Constitutional: Negative for fever and chills.  Skin: Positive for wound.   Objective:  BP 116/74 mmHg  Pulse 84  Temp(Src) 97.7 F (36.5 C) (Oral)  Resp 14  Ht 5' 4.5" (1.638 m)  Wt 184 lb (83.462 kg)  BMI 31.11 kg/m2  SpO2 98%  Physical Exam  Constitutional: She is oriented to person, place, and time and well-developed, well-nourished, and in no distress.  HENT:  Head: Normocephalic and atraumatic.  Right Ear: Hearing and external ear normal.  Left Ear: Hearing and external ear normal.  Eyes: Conjunctivae are normal.  Neck: Normal range of motion.  Pulmonary/Chest: Effort normal.  Neurological: She is alert and oriented to person, place, and time. Gait normal.  Skin: Skin is warm and dry.  Packing removed - no purulence expressed - minimal induration along the incision border - there is no erythema and no TTP - good granulation tissue present in the wound cavity  Psychiatric: Mood, memory, affect and judgment normal.  Vitals reviewed.   Assessment and Plan :  Infected sebaceous cyst   Finish abx - keep covered until skin is healed over.  F/u only if needed.  Windell Hummingbird PA-C  [facility] 10/25/2014 7:13 PM

## 2014-10-29 NOTE — Progress Notes (Signed)
  Medical screening examination/treatment/procedure(s) were performed by non-physician practitioner and as supervising physician I was immediately available for consultation/collaboration.     

## 2014-11-01 ENCOUNTER — Ambulatory Visit: Payer: BC Managed Care – PPO | Admitting: Obstetrics & Gynecology

## 2015-09-20 ENCOUNTER — Ambulatory Visit: Payer: BC Managed Care – PPO | Admitting: Certified Nurse Midwife

## 2015-09-26 ENCOUNTER — Other Ambulatory Visit: Payer: Self-pay | Admitting: Internal Medicine

## 2015-09-26 DIAGNOSIS — Z1231 Encounter for screening mammogram for malignant neoplasm of breast: Secondary | ICD-10-CM

## 2015-10-03 ENCOUNTER — Other Ambulatory Visit: Payer: Self-pay | Admitting: Nurse Practitioner

## 2015-10-03 DIAGNOSIS — N6313 Unspecified lump in the right breast, lower outer quadrant: Secondary | ICD-10-CM

## 2015-10-08 ENCOUNTER — Ambulatory Visit
Admission: RE | Admit: 2015-10-08 | Discharge: 2015-10-08 | Disposition: A | Discharge status: Home / Self Care | Payer: BC Managed Care – PPO | Source: Ambulatory Visit | Attending: Internal Medicine | Admitting: Internal Medicine

## 2015-10-08 DIAGNOSIS — N6313 Unspecified lump in the right breast, lower outer quadrant: Secondary | ICD-10-CM

## 2016-02-17 LAB — HM COLONOSCOPY

## 2017-01-20 ENCOUNTER — Ambulatory Visit
Admission: RE | Admit: 2017-01-20 | Discharge: 2017-01-20 | Disposition: A | Discharge status: Home / Self Care | Payer: BC Managed Care – PPO | Source: Ambulatory Visit | Attending: Internal Medicine | Admitting: Internal Medicine

## 2017-01-20 ENCOUNTER — Other Ambulatory Visit: Payer: Self-pay | Admitting: Internal Medicine

## 2017-01-20 DIAGNOSIS — Z1231 Encounter for screening mammogram for malignant neoplasm of breast: Secondary | ICD-10-CM

## 2017-01-20 HISTORY — DX: Personal history of antineoplastic chemotherapy: Z92.21

## 2017-04-05 ENCOUNTER — Other Ambulatory Visit: Payer: Self-pay | Admitting: Obstetrics and Gynecology

## 2017-04-05 DIAGNOSIS — R922 Inconclusive mammogram: Secondary | ICD-10-CM

## 2017-04-05 DIAGNOSIS — R9230 Dense breasts, unspecified: Secondary | ICD-10-CM

## 2017-04-08 ENCOUNTER — Other Ambulatory Visit: Payer: Self-pay

## 2017-04-08 ENCOUNTER — Other Ambulatory Visit: Payer: Self-pay | Admitting: Obstetrics and Gynecology

## 2017-04-08 DIAGNOSIS — R922 Inconclusive mammogram: Secondary | ICD-10-CM

## 2017-04-08 DIAGNOSIS — R9230 Dense breasts, unspecified: Secondary | ICD-10-CM

## 2017-04-09 ENCOUNTER — Other Ambulatory Visit: Payer: BC Managed Care – PPO

## 2017-04-12 ENCOUNTER — Other Ambulatory Visit: Payer: BC Managed Care – PPO

## 2017-04-29 ENCOUNTER — Ambulatory Visit
Admission: RE | Admit: 2017-04-29 | Discharge: 2017-04-29 | Disposition: A | Discharge status: Home / Self Care | Payer: BC Managed Care – PPO | Source: Ambulatory Visit | Attending: Obstetrics and Gynecology | Admitting: Obstetrics and Gynecology

## 2017-04-29 DIAGNOSIS — R922 Inconclusive mammogram: Secondary | ICD-10-CM

## 2018-02-23 ENCOUNTER — Encounter: Payer: Self-pay | Admitting: Nurse Practitioner

## 2018-02-23 ENCOUNTER — Ambulatory Visit (INDEPENDENT_AMBULATORY_CARE_PROVIDER_SITE_OTHER): Payer: BC Managed Care – PPO | Admitting: Nurse Practitioner

## 2018-02-23 VITALS — BP 138/78 | HR 84 | Temp 98.6°F | Ht 64.5 in | Wt 185.0 lb

## 2018-02-23 DIAGNOSIS — J209 Acute bronchitis, unspecified: Secondary | ICD-10-CM | POA: Insufficient documentation

## 2018-02-23 DIAGNOSIS — J40 Bronchitis, not specified as acute or chronic: Secondary | ICD-10-CM

## 2018-02-23 MED ORDER — PREDNISONE 10 MG (21) PO TBPK: ORAL_TABLET | ORAL | 0 refills | Status: DC

## 2018-02-23 MED ORDER — BENZONATATE 100 MG PO CAPS: 100.0000 mg | ORAL_CAPSULE | Freq: Four times a day (QID) | ORAL | 1 refills | Status: DC | PRN

## 2018-02-23 NOTE — Progress Notes (Signed)
  Subjective:     Patient ID: Gloria Chen , female    DOB: [birth year] , 57 y.o.   MRN: [id number]   Chief Complaint  Patient presents with  . Cough    2 weeks     HPI  Cough  This is a new problem. The current episode started 1 to 4 weeks ago. The problem has been gradually worsening. The problem occurs constantly. The cough is non-productive. Pertinent negatives include no chest pain, chills, fever, nasal congestion, sore throat or shortness of breath. The symptoms are aggravated by other. She has tried OTC cough suppressant for the symptoms. There is no history of asthma or bronchitis.     Past Medical History:  Diagnosis Date  . Allergy   . Cancer ([place])   . Personal history of chemotherapy      Family History  Problem Relation Age of Onset  . Hypertension Mother   . Heart disease Father   . Hyperlipidemia Father   . Hypertension Father   . Stroke Father   . Hypertension Sister   . Diabetes Sister   . Hypertension Brother   . Breast cancer Maternal Aunt     No current outpatient medications on file.   No Known Allergies   Review of Systems  Constitutional: Negative for chills and fever.  HENT: Negative for sore throat.   Respiratory: Positive for cough. Negative for shortness of breath.   Cardiovascular: Negative for chest pain.  Neurological: Negative.      Today's Vitals   02/23/18 1509  BP: 138/78  Pulse: 84  Temp: 98.6 F (37 C)  TempSrc: Oral  SpO2: 90%  Weight: 185 lb (83.9 kg)  Height: 5' 4.5" (1.638 m)   Body mass index is 31.26 kg/m.   Objective:  Physical Exam Constitutional:      Appearance: She is well-developed and well-nourished.  Cardiovascular:     Rate and Rhythm: Normal rate and regular rhythm.     Heart sounds: Normal heart sounds, S1 normal and S2 normal.  Pulmonary:     Breath sounds: Wheezing present.     Comments: Slightly labored breathing Chest:     Chest wall: No tenderness.  Neurological:     Mental Status: She is  alert and oriented to person, place, and time.       Assessment And Plan:   1. Bronchitis  2 week history of cough getting worse  Hacking cough noted and slightly labored breathing - predniSONE (STERAPRED UNI-PAK 21 TAB) 10 MG (21) TBPK tablet; Take as directed. (Patient not taking: Reported on 03/02/2018)  Dispense: 21 tablet; Refill: 0 - benzonatate (TESSALON PERLES) 100 MG capsule; Take 1 capsule (100 mg total) by mouth every 6 (six) hours as needed for cough. (Patient not taking: Reported on 03/02/2018)  Dispense: 30 capsule; Refill: [place], FNP

## 2018-03-02 ENCOUNTER — Encounter: Payer: BC Managed Care – PPO | Admitting: Internal Medicine

## 2018-03-02 ENCOUNTER — Ambulatory Visit (INDEPENDENT_AMBULATORY_CARE_PROVIDER_SITE_OTHER): Payer: BC Managed Care – PPO | Admitting: Internal Medicine

## 2018-03-02 ENCOUNTER — Encounter: Payer: Self-pay | Admitting: Internal Medicine

## 2018-03-02 VITALS — BP 122/76 | HR 75 | Temp 98.1°F | Ht 62.5 in | Wt 179.0 lb

## 2018-03-02 DIAGNOSIS — Z0000 Encounter for general adult medical examination without abnormal findings: Secondary | ICD-10-CM

## 2018-03-02 DIAGNOSIS — J40 Bronchitis, not specified as acute or chronic: Secondary | ICD-10-CM

## 2018-03-02 DIAGNOSIS — E6609 Other obesity due to excess calories: Secondary | ICD-10-CM | POA: Diagnosis not present

## 2018-03-02 DIAGNOSIS — Z6832 Body mass index (BMI) 32.0-32.9, adult: Secondary | ICD-10-CM | POA: Diagnosis not present

## 2018-03-02 LAB — CBC
Hematocrit: 42.9 % (ref 34.0–46.6)
Hemoglobin: 14 g/dL (ref 11.1–15.9)
MCH: 29.2 pg (ref 26.6–33.0)
MCHC: 32.6 g/dL (ref 31.5–35.7)
MCV: 90 fL (ref 79–97)
Platelets: 291 10*3/uL (ref 150–450)
RBC: 4.79 x10E6/uL (ref 3.77–5.28)
RDW: 12.3 % (ref 12.3–15.4)
WBC: 10.3 10*3/uL (ref 3.4–10.8)

## 2018-03-02 LAB — POCT URINALYSIS DIPSTICK
Bilirubin, UA: NEGATIVE
Blood, UA: NEGATIVE
Glucose, UA: NEGATIVE
KETONES UA: NEGATIVE
Leukocytes, UA: NEGATIVE
Nitrite, UA: NEGATIVE
Protein, UA: NEGATIVE
Spec Grav, UA: 1.025 (ref 1.010–1.025)
Urobilinogen, UA: 0.2 E.U./dL
pH, UA: 5 (ref 5.0–8.0)

## 2018-03-02 LAB — CMP14+EGFR
A/G RATIO: 1.5 (ref 1.2–2.2)
ALBUMIN: 4.4 g/dL (ref 3.5–5.5)
ALK PHOS: 75 IU/L (ref 39–117)
ALT: 35 IU/L — AB (ref 0–32)
AST: 14 IU/L (ref 0–40)
BILIRUBIN TOTAL: 0.4 mg/dL (ref 0.0–1.2)
BUN / CREAT RATIO: 20 (ref 9–23)
BUN: 14 mg/dL (ref 6–24)
CHLORIDE: 102 mmol/L (ref 96–106)
CO2: 23 mmol/L (ref 20–29)
Calcium: 10.4 mg/dL — ABNORMAL HIGH (ref 8.7–10.2)
Creatinine, Ser: 0.7 mg/dL (ref 0.57–1.00)
GFR calc Af Amer: 113 mL/min/{1.73_m2} (ref >59)
GFR calc non Af Amer: 98 mL/min/{1.73_m2} (ref >59)
Globulin, Total: 3 g/dL (ref 1.5–4.5)
Glucose: 86 mg/dL (ref 65–99)
POTASSIUM: 4.1 mmol/L (ref 3.5–5.2)
Sodium: 142 mmol/L (ref 134–144)
TOTAL PROTEIN: 7.4 g/dL (ref 6.0–8.5)

## 2018-03-02 LAB — LIPID PANEL
Chol/HDL Ratio: 2.5 ratio (ref 0.0–4.4)
Cholesterol, Total: 170 mg/dL (ref 100–199)
HDL: 68 mg/dL (ref >39)
LDL CALC: 77 mg/dL (ref 0–99)
Triglycerides: 123 mg/dL (ref 0–149)
VLDL Cholesterol Cal: 25 mg/dL (ref 5–40)

## 2018-03-02 LAB — HEMOGLOBIN A1C
Est. average glucose Bld gHb Est-mCnc: 117 mg/dL
Hgb A1c MFr Bld: 5.7 % — ABNORMAL HIGH (ref 4.8–5.6)

## 2018-03-02 MED ORDER — ALBUTEROL SULFATE (2.5 MG/3ML) 0.083% IN NEBU: 2.5000 mg | INHALATION_SOLUTION | Freq: Once | RESPIRATORY_TRACT | Status: DC

## 2018-03-02 NOTE — Patient Instructions (Signed)

## 2018-03-13 ENCOUNTER — Encounter: Payer: Self-pay | Admitting: Internal Medicine

## 2018-03-13 NOTE — Progress Notes (Signed)
Subjective:     Patient ID: Gloria Chen , female    DOB: [birth year] , 57 y.o.   MRN: [id number]   Chief Complaint  Patient presents with  . Annual Exam    HPI  She is here today for a full physical exam. She is followed by GYN for her pelvic/pap exams.     Past Medical History:  Diagnosis Date  . Allergy   . Cancer ([place])   . Personal history of chemotherapy      Family History  Problem Relation Age of Onset  . Hypertension Mother   . Heart disease Father   . Hyperlipidemia Father   . Hypertension Father   . Stroke Father   . Hypertension Sister   . Diabetes Sister   . Hypertension Brother   . Breast cancer Maternal Aunt      Current Outpatient Medications:  .  benzonatate (TESSALON PERLES) 100 MG capsule, Take 1 capsule (100 mg total) by mouth every 6 (six) hours as needed for cough. (Patient not taking: Reported on 03/02/2018), Disp: 30 capsule, Rfl: 1 .  predniSONE (STERAPRED UNI-PAK 21 TAB) 10 MG (21) TBPK tablet, Take as directed. (Patient not taking: Reported on 03/02/2018), Disp: 21 tablet, Rfl: 0  Current Facility-Administered Medications:  .  albuterol (PROVENTIL) (2.5 MG/3ML) 0.083% nebulizer solution 2.5 mg, 2.5 mg, Nebulization, Once, Glendale Chard, MD   No Known Allergies     No LMP recorded. Patient is postmenopausal..  Negative for: breast discharge, breast lump(s), breast pain and breast self exam. Associated symptoms include abnormal vaginal bleeding. Pertinent negatives include abnormal bleeding (hematology), anxiety, decreased libido, depression, difficulty falling sleep, dyspareunia, history of infertility, nocturia, sexual dysfunction, sleep disturbances, urinary incontinence, urinary urgency, vaginal discharge and vaginal itching. Diet regular.The patient states her exercise level is   minimal.   . The patient's tobacco use is:  Social History   Tobacco Use  Smoking Status Never Smoker  Smokeless Tobacco Never Used  . She has been exposed to  passive smoke. The patient's alcohol use is:  Social History   Substance and Sexual Activity  Alcohol Use No    Review of Systems  Constitutional: Negative.   HENT: Negative.   Eyes: Negative.   Respiratory: Negative.   Cardiovascular: Negative.   Gastrointestinal: Negative.   Endocrine: Negative.   Genitourinary: Negative.   Musculoskeletal: Negative.   Skin: Negative.   Allergic/Immunologic: Negative.   Neurological: Negative.   Psychiatric/Behavioral: Negative.      Today's Vitals   03/02/18 0951  BP: 122/76  Pulse: 75  Temp: 98.1 F (36.7 C)  TempSrc: Oral  SpO2: 94%  Weight: 179 lb (81.2 kg)  Height: 5' 2.5" (1.588 m)   Body mass index is 32.22 kg/m.   Objective:  Physical Exam Vitals signs and nursing note reviewed.  Constitutional:      Appearance: Normal appearance. She is obese.  HENT:     Head: Normocephalic and atraumatic.     Right Ear: Tympanic membrane, ear canal and external ear normal.     Left Ear: Tympanic membrane, ear canal and external ear normal.     Nose: Nose normal.     Mouth/Throat:     Mouth: Mucous membranes are moist.  Neck:     Musculoskeletal: Normal range of motion and neck supple.  Cardiovascular:     Rate and Rhythm: Normal rate and regular rhythm.     Pulses: Normal pulses.     Heart sounds: Normal heart sounds.  Pulmonary:     Effort: Pulmonary effort is normal.     Breath sounds: Examination of the right-middle field reveals rhonchi. Examination of the left-middle field reveals rhonchi. Rhonchi present.  Chest:     Breasts:        Right: No swelling, bleeding, inverted nipple, mass or nipple discharge.        Left: Absent.  Abdominal:     General: Abdomen is flat. Bowel sounds are normal.  Genitourinary:    Comments: deferred Musculoskeletal: Normal range of motion.  Skin:    General: Skin is warm and dry.     Capillary Refill: Capillary refill takes less than 2 seconds.  Neurological:     General: No focal  deficit present.     Mental Status: She is alert.  Psychiatric:        Mood and Affect: Mood normal.         Assessment And Plan:     1. Routine general medical examination at health care facility  A full exam was performed. Importance of monthly self breast exams was discussed with the patient. PATIENT HAS BEEN ADVISED TO GET 30-45 MINUTES REGULAR EXERCISE NO LESS THAN FOUR TO FIVE DAYS PER WEEK - BOTH WEIGHTBEARING EXERCISES AND AEROBIC ARE RECOMMENDED.  SHE IS ADVISED TO FOLLOW A HEALTHY DIET WITH AT LEAST SIX FRUITS/VEGGIES PER DAY, DECREASE INTAKE OF RED MEAT, AND TO INCREASE FISH INTAKE TO TWO DAYS PER WEEK.  MEATS/FISH SHOULD NOT BE FRIED, BAKED OR BROILED IS PREFERABLE.  I SUGGEST WEARING SPF 50 SUNSCREEN ON EXPOSED PARTS AND ESPECIALLY WHEN IN THE DIRECT SUNLIGHT FOR AN EXTENDED PERIOD OF TIME.  PLEASE AVOID FAST FOOD RESTAURANTS AND INCREASE YOUR WATER INTAKE.  - CMP14+EGFR - CBC - Lipid panel - Hemoglobin A1c - POCT Urinalysis Dipstick (81002)  2. Bronchitis  She was given albuterol nebulizer treatment with improvement of her breath sounds. She will let me know if her sx persist.   - albuterol (PROVENTIL) (2.5 MG/3ML) 0.083% nebulizer solution 2.5 mg  3. Class 1 obesity due to excess calories with serious comorbidity and body mass index (BMI) of 32.0 to 32.9 in adult  She is encouraged to strive for BMI less than 28 to decrease cardiac risk. She is advised to incorporate more exercise into her daily routine. She is encouraged to strive for 30 minutes no less than five days weekly.   Maximino Greenland, MD

## 2018-03-13 NOTE — Progress Notes (Signed)
Your calcium level is sl. Elevated, pls increase your water intake. Your liver enzymes are elevated, do you take otc meds like advil/tylenol regularly? If yes, I would stop? This can also be due to increased sugar intake. Your blood count is normal. Your chol is great. Your hba1c is 5.7, this is in predm range. Normal is 5.6, you are almost there!  I would like to recheck your liver function in 3 months. Kw-pls schedule lab visit. Check lfts dx: abnormal lfts.

## 2018-03-18 ENCOUNTER — Telehealth: Payer: Self-pay

## 2018-03-18 NOTE — Telephone Encounter (Signed)
Left the pt a message to call back for recent lab results.

## 2018-03-18 NOTE — Telephone Encounter (Signed)
-----   Message from Glendale Chard, MD sent at 03/13/2018  3:57 PM EST ----- Your calcium level is sl. Elevated, pls increase your water intake. Your liver enzymes are elevated, do you take otc meds like advil/tylenol regularly? If yes, I would stop? This can also be due to increased sugar intake. Your blood count is normal. Your chol is great. Your hba1c is 5.7, this is in predm range. Normal is 5.6, you are almost there!  I would like to recheck your liver function in 3 months. Kw-pls schedule lab visit. Check lfts dx: abnormal lfts.

## 2018-05-30 ENCOUNTER — Other Ambulatory Visit: Payer: Self-pay

## 2018-05-30 ENCOUNTER — Encounter: Payer: Self-pay | Admitting: Nurse Practitioner

## 2018-05-30 ENCOUNTER — Ambulatory Visit (INDEPENDENT_AMBULATORY_CARE_PROVIDER_SITE_OTHER): Payer: BC Managed Care – PPO | Admitting: Nurse Practitioner

## 2018-05-30 VITALS — BP 124/82 | HR 108 | Temp 98.8°F | Ht 64.2 in | Wt 180.0 lb

## 2018-05-30 DIAGNOSIS — J209 Acute bronchitis, unspecified: Secondary | ICD-10-CM | POA: Diagnosis not present

## 2018-05-30 MED ORDER — HYDROCODONE-HOMATROPINE 5-1.5 MG/5ML PO SYRP: 5.0000 mL | ORAL_SOLUTION | Freq: Four times a day (QID) | ORAL | 0 refills | Status: DC | PRN

## 2018-05-30 MED ORDER — FLUTICASONE PROPIONATE 50 MCG/ACT NA SUSP: 1.0000 | Freq: Every day | NASAL | 2 refills | Status: DC

## 2018-05-30 MED ORDER — PREDNISONE 10 MG (21) PO TBPK: ORAL_TABLET | ORAL | 0 refills | Status: DC

## 2018-05-30 NOTE — Patient Instructions (Signed)
Acute Bronchitis, Adult Acute bronchitis is when air tubes (bronchi) in the lungs suddenly get swollen. The condition can make it hard to breathe. It can also cause these symptoms:  A cough.  Coughing up clear, yellow, or green mucus.  Wheezing.  Chest congestion.  Shortness of breath.  A fever.  Body aches.  Chills.  A sore throat. Follow these instructions at home:  Medicines  Take over-the-counter and prescription medicines only as told by your doctor.  If you were prescribed an antibiotic medicine, take it as told by your doctor. Do not stop taking the antibiotic even if you start to feel better. General instructions  Rest.  Drink enough fluids to keep your pee (urine) pale yellow.  Avoid smoking and secondhand smoke. If you smoke and you need help quitting, ask your doctor. Quitting will help your lungs heal faster.  Use an inhaler, cool mist vaporizer, or humidifier as told by your doctor.  Keep all follow-up visits as told by your doctor. This is important. How is this prevented? To lower your risk of getting this condition again:  Wash your hands often with soap and water. If you cannot use soap and water, use hand sanitizer.  Avoid contact with people who have cold symptoms.  Try not to touch your hands to your mouth, nose, or eyes.  Make sure to get the flu shot every year. Contact a doctor if:  Your symptoms do not get better in 2 weeks. Get help right away if:  You cough up blood.  You have chest pain.  You have very bad shortness of breath.  You become dehydrated.  You faint (pass out) or keep feeling like you are going to pass out.  You keep throwing up (vomiting).  You have a very bad headache.  Your fever or chills gets worse. This information is not intended to replace advice given to you by your health care provider. Make sure you discuss any questions you have with your health care provider. Document Released: 08/19/2007 Document  Revised: 10/14/2016 Document Reviewed: 08/21/2015 Elsevier Interactive Patient Education  2019 [employer]vier Inc.  

## 2018-05-30 NOTE — Progress Notes (Signed)
  Subjective:     Patient ID: Gloria Chen , female    DOB: July 09, 1960 , 58 y.o.   MRN: [id number]   Chief Complaint  Patient presents with  . Sore Throat  . flu like symptoms    HPI  She had visitors from the [place] - coughing initially.    Sore Throat   This is a new problem. The current episode started in the past 7 days. The problem has been gradually worsening. Neither side of throat is experiencing more pain than the other. There has been no fever. Associated symptoms include coughing and headaches. Pertinent negatives include no abdominal pain, congestion or neck pain. Associated symptoms comments: Rhinorrhea - yellow in color.    sorethroat is present  fatigue. She has had no exposure to strep or mono. Treatments tried: HBP coricidan.     Past Medical History:  Diagnosis Date  . Allergy   . Cancer ([place])   . Personal history of chemotherapy      Family History  Problem Relation Age of Onset  . Hypertension Mother   . Heart disease Father   . Hyperlipidemia Father   . Hypertension Father   . Stroke Father   . Hypertension Sister   . Diabetes Sister   . Hypertension Brother   . Breast cancer Maternal Aunt      Current Outpatient Medications:  .  DM-APAP-CPM (CORICIDIN HBP) 10-325-2 MG TABS, Take by mouth., Disp: , Rfl:  .  Menthol (HALLS COUGH DROPS) 5.8 MG LOZG, Use as directed in the mouth or throat., Disp: , Rfl:   Current Facility-Administered Medications:  .  albuterol (PROVENTIL) (2.5 MG/3ML) 0.083% nebulizer solution 2.5 mg, 2.5 mg, Nebulization, Once, Glendale Chard, MD   No Known Allergies   Review of Systems  HENT: Negative for congestion.   Eyes: Negative for photophobia, pain, redness, itching and visual disturbance.  Respiratory: Positive for cough.   Gastrointestinal: Negative for abdominal pain.  Musculoskeletal: Negative for neck pain.  Neurological: Positive for headaches.     Today's Vitals   05/30/18 1440  BP: 124/82  Pulse:  (!) 108  Temp: 98.8 F (37.1 C)  TempSrc: Oral  SpO2: 96%  Weight: 180 lb (81.6 kg)  Height: 5' 4.2" (1.631 m)  PainSc: 1   PainLoc: Chest   Body mass index is 30.7 kg/m.   Objective:  Physical Exam Neurological:     General: No focal deficit present.     Mental Status: She is alert and oriented to person, place, and time.  Psychiatric:        Mood and Affect: Mood normal.        Behavior: Behavior normal.         Assessment And Plan:   1. Acute bronchitis, unspecified organism  If not better return call to office  Advised to avoid taking cough syrup when driving or operating heavy machinery - predniSONE (STERAPRED UNI-PAK 21 TAB) 10 MG (21) TBPK tablet; Take as directed  Dispense: 21 tablet; Refill: 0 - fluticasone (FLONASE) 50 MCG/ACT nasal spray; Place 1 spray into both nostrils daily.  Dispense: 16 g; Refill: 2 - HYDROcodone-homatropine (HYDROMET) 5-1.5 MG/5ML syrup; Take 5 mLs by mouth every 6 (six) hours as needed.  Dispense: 120 mL; Refill: 0       Minette Brine, FNP

## 2018-06-02 ENCOUNTER — Telehealth: Payer: Self-pay

## 2018-06-02 NOTE — Telephone Encounter (Signed)
The pt wants to know if she needs an antibiotic because her mucous is now green and that she got a prescription for prednisone at her visit.  I told the patient that I would let her know and that Laurance Flatten, NP isn't in today.

## 2018-06-03 ENCOUNTER — Other Ambulatory Visit: Payer: Self-pay

## 2018-06-03 MED ORDER — AZITHROMYCIN 250 MG PO TABS: ORAL_TABLET | ORAL | 0 refills | Status: DC

## 2018-06-18 ENCOUNTER — Encounter: Payer: Self-pay | Admitting: Internal Medicine

## 2018-06-20 ENCOUNTER — Other Ambulatory Visit: Payer: Self-pay

## 2018-06-27 ENCOUNTER — Other Ambulatory Visit: Payer: BC Managed Care – PPO

## 2018-06-27 ENCOUNTER — Other Ambulatory Visit: Payer: Self-pay | Admitting: Internal Medicine

## 2018-06-27 ENCOUNTER — Other Ambulatory Visit: Payer: Self-pay

## 2018-06-27 DIAGNOSIS — R7989 Other specified abnormal findings of blood chemistry: Secondary | ICD-10-CM

## 2018-06-27 DIAGNOSIS — R945 Abnormal results of liver function studies: Secondary | ICD-10-CM

## 2018-06-28 LAB — HEPATIC FUNCTION PANEL
ALT: 29 IU/L (ref 0–32)
AST: 18 IU/L (ref 0–40)
Albumin: 4.3 g/dL (ref 3.8–4.9)
Alkaline Phosphatase: 81 IU/L (ref 39–117)
Bilirubin Total: 0.2 mg/dL (ref 0.0–1.2)
Bilirubin, Direct: 0.06 mg/dL (ref 0.00–0.40)
Total Protein: 6.9 g/dL (ref 6.0–8.5)

## 2018-09-05 ENCOUNTER — Ambulatory Visit: Payer: BC Managed Care – PPO | Admitting: Internal Medicine

## 2018-10-17 ENCOUNTER — Other Ambulatory Visit: Payer: Self-pay

## 2018-10-17 ENCOUNTER — Encounter: Payer: Self-pay | Admitting: Internal Medicine

## 2018-10-17 ENCOUNTER — Ambulatory Visit (INDEPENDENT_AMBULATORY_CARE_PROVIDER_SITE_OTHER): Payer: BC Managed Care – PPO | Admitting: Internal Medicine

## 2018-10-17 VITALS — BP 116/74 | HR 72 | Temp 98.2°F | Ht 64.2 in | Wt 182.8 lb

## 2018-10-17 DIAGNOSIS — M25571 Pain in right ankle and joints of right foot: Secondary | ICD-10-CM

## 2018-10-17 DIAGNOSIS — E559 Vitamin D deficiency, unspecified: Secondary | ICD-10-CM

## 2018-10-17 DIAGNOSIS — R635 Abnormal weight gain: Secondary | ICD-10-CM

## 2018-10-17 DIAGNOSIS — D229 Melanocytic nevi, unspecified: Secondary | ICD-10-CM

## 2018-10-17 DIAGNOSIS — R922 Inconclusive mammogram: Secondary | ICD-10-CM

## 2018-10-17 DIAGNOSIS — Z1239 Encounter for other screening for malignant neoplasm of breast: Secondary | ICD-10-CM

## 2018-10-17 DIAGNOSIS — Z6831 Body mass index (BMI) 31.0-31.9, adult: Secondary | ICD-10-CM

## 2018-10-17 NOTE — Patient Instructions (Signed)
Drink lemon water  Add turmeric to your diet Consider Aspercreme applied to right ankle for pain (Arnica gel)   Ankle Pain The ankle joint holds your body weight and allows you to move around. Ankle pain can occur on either side or the back of one ankle or both ankles. Ankle pain may be sharp and burning or dull and aching. There may be tenderness, stiffness, redness, or warmth around the ankle. Many things can cause ankle pain, including an injury to the area and overuse of the ankle. Follow these instructions at home: Activity  Rest your ankle as told by your health care provider. Avoid any activities that cause ankle pain.  Do not use the injured limb to support your body weight until your health care provider says that you can. Use crutches as told by your health care provider.  Do exercises as told by your health care provider.  Ask your health care provider when it is safe to drive if you have a brace on your ankle. If you have a brace:  Wear the brace as told by your health care provider. Remove it only as told by your health care provider.  Loosen the brace if your toes tingle, become numb, or turn cold and blue.  Keep the brace clean.  If the brace is not waterproof: ? Do not let it get wet. ? Cover it with a watertight covering when you take a bath or shower. If you were given an elastic bandage:   Remove it when you take a bath or a shower.  Try not to move your ankle very much, but wiggle your toes from time to time. This helps to prevent swelling.  Adjust the bandage to make it more comfortable if it feels too tight.  Loosen the bandage if you have numbness or tingling in your foot or if your foot turns cold and blue. Managing pain, stiffness, and swelling   If directed, put ice on the painful area. ? If you have a removable brace or elastic bandage, remove it as told by your health care provider. ? Put ice in a plastic bag. ? Place a towel between your skin  and the bag. ? Leave the ice on for 20 minutes, 2-3 times a day.  Move your toes often to avoid stiffness and to lessen swelling.  Raise (elevate) your ankle above the level of your heart while you are sitting or lying down. General instructions  Record information about your pain. Writing down the following may be helpful for you and your health care provider: ? How often you have ankle pain. ? Where the pain is located. ? What the pain feels like.  If treatment involves wearing a prescribed shoe or insole, make sure you wear it correctly and for as long as told by your health care provider.  Take over-the-counter and prescription medicines only as told by your health care provider.  Keep all follow-up visits as told by your health care provider. This is important. Contact a health care provider if:  Your pain gets worse.  Your pain is not relieved with medicines.  You have a fever or chills.  You are having more trouble with walking.  You have new symptoms. Get help right away if:  Your foot, leg, toes, or ankle: ? Tingles or becomes numb. ? Becomes swollen. ? Turns pale or blue. Summary  Ankle pain can occur on either side or the back of one ankle or both ankles.  Ankle  pain may be sharp and burning or dull and aching.  Rest your ankle as told by your health care provider. If told, apply ice to the area.  Take over-the-counter and prescription medicines only as told by your health care provider. This information is not intended to replace advice given to you by your health care provider. Make sure you discuss any questions you have with your health care provider. Document Released: 08/20/2009 Document Revised: 06/21/2018 Document Reviewed: 09/08/2017 Elsevier Patient Education  2020 [employer].

## 2018-10-18 LAB — BMP8+EGFR
BUN/Creatinine Ratio: 14 (ref 9–23)
BUN: 9 mg/dL (ref 6–24)
CO2: 19 mmol/L — ABNORMAL LOW (ref 20–29)
Calcium: 10.1 mg/dL (ref 8.7–10.2)
Chloride: 105 mmol/L (ref 96–106)
Creatinine, Ser: 0.64 mg/dL (ref 0.57–1.00)
GFR calc Af Amer: 115 mL/min/{1.73_m2} (ref >59)
GFR calc non Af Amer: 100 mL/min/{1.73_m2} (ref >59)
Glucose: 107 mg/dL — ABNORMAL HIGH (ref 65–99)
Potassium: 4.2 mmol/L (ref 3.5–5.2)
Sodium: 141 mmol/L (ref 134–144)

## 2018-10-18 LAB — HEMOGLOBIN A1C
Est. average glucose Bld gHb Est-mCnc: 117 mg/dL
Hgb A1c MFr Bld: 5.7 % — ABNORMAL HIGH (ref 4.8–5.6)

## 2018-10-18 LAB — INSULIN, RANDOM: INSULIN: 18 u[IU]/mL (ref 2.6–24.9)

## 2018-10-18 LAB — VITAMIN D 25 HYDROXY (VIT D DEFICIENCY, FRACTURES): Vit D, 25-Hydroxy: 20.6 ng/mL — ABNORMAL LOW (ref 30.0–100.0)

## 2018-10-19 ENCOUNTER — Other Ambulatory Visit: Payer: Self-pay | Admitting: Internal Medicine

## 2018-10-19 MED ORDER — VITAMIN D (ERGOCALCIFEROL) 1.25 MG (50000 UNIT) PO CAPS: ORAL_CAPSULE | ORAL | 2 refills | Status: DC

## 2018-10-31 NOTE — Progress Notes (Signed)
Subjective:     Patient ID: Gloria Chen , female    DOB: 12/09/1960 , 58 y.o.   MRN: [id number]   Chief Complaint  Patient presents with  . Obesity  . Mole on left side of head  . Ankle Pain    right side  . Breast thicking under the skin    right side    HPI  She is here today with multiple concerns. First, she is concerned about her weight gain.  She admits that she has not been exercising regularly. However, she admits that she does need help with her diet. She is also concerned about a "mole" on her temple. She feels that it has enlarged in size. It itches on occasion. She has not noticed any drainage from the lesion.   Ankle Pain  The incident occurred more than 1 week ago. The pain is present in the right ankle. The quality of the pain is described as aching. The pain is at a severity of 5/10. The pain is moderate. The pain has been intermittent since onset. Pertinent negatives include no inability to bear weight. She reports no foreign bodies present. The symptoms are aggravated by weight bearing. She has tried elevation for the symptoms. The treatment provided mild relief.     Past Medical History:  Diagnosis Date  . Allergy   . Cancer ([place])   . Personal history of chemotherapy      Family History  Problem Relation Age of Onset  . Hypertension Mother   . Heart disease Father   . Hyperlipidemia Father   . Hypertension Father   . Stroke Father   . Hypertension Sister   . Diabetes Sister   . Hypertension Brother   . Breast cancer Maternal Aunt      Current Outpatient Medications:  Marland Kitchen  Vitamin D, Ergocalciferol, (DRISDOL) 1.25 MG (50000 UT) CAPS capsule, Take one capsule po twice weekly on Tuesdays/Fridays, Disp: 8 capsule, Rfl: 2   No Known Allergies   Review of Systems  Constitutional: Negative.   Respiratory: Negative.   Cardiovascular: Negative.   Gastrointestinal: Negative.   Genitourinary:       She also wants breast exam. She feels there is something  different on her right breast. Her GYN is Dr. Mancel Bale, she has her mammo performed at her office. States she was advised by Dr. Mancel Bale there is no mammographic cause for concern. She denies breast pain, nipple d/c and she has not felt a discrete lump.  Skin:       She has spot on her face. Wants to have this removed.   Neurological: Negative.   Psychiatric/Behavioral: Negative.      Today's Vitals   10/17/18 0955  BP: 116/74  Pulse: 72  Temp: 98.2 F (36.8 C)  TempSrc: Oral  Weight: 182 lb 12.8 oz (82.9 kg)  Height: 5' 4.2" (1.631 m)   Body mass index is 31.18 kg/m.   Objective:  Physical Exam Vitals signs and nursing note reviewed.  Constitutional:      Appearance: Normal appearance.  HENT:     Head: Normocephalic and atraumatic.  Cardiovascular:     Rate and Rhythm: Normal rate and regular rhythm.     Heart sounds: Normal heart sounds.  Pulmonary:     Effort: Pulmonary effort is normal.     Breath sounds: Normal breath sounds.  Chest:     Breasts: Tanner Score is 5.        Right: Normal.  Left: Normal.  Musculoskeletal:     Right ankle: Achilles tendon exhibits no pain.     Comments: Slight r ankle tenderness to palpation.Full ROM. NO overlying erythema over lateral/medial malleolus  Skin:    General: Skin is warm.     Findings: Lesion present.     Comments: Hyperpigmented, raised lesion on left temple.   Neurological:     General: No focal deficit present.     Mental Status: She is alert.  Psychiatric:        Mood and Affect: Mood normal.        Behavior: Behavior normal.         Assessment And Plan:   1. Weight gain  She agrees to referral to [facility]. She is encouraged to incorporate more exercise into her daily routine. I will check labs as listed below to evaluate for insulin resistance.   - Amb Ref to Medical Weight Management - Hemoglobin A1c - Insulin, random(561) - BMP8+EGFR  2. Nevus  She agrees to [facility] referral.   - Ambulatory  referral to Dermatology  3. Acute right ankle pain  She is advised to apply topical pain cream to affected area prn.  She will let me know if her sx persist.   4. Vitamin D deficiency  I WILL CHECK A VIT D LEVEL AND SUPPLEMENT AS NEEDED.  ALSO ENCOURAGED TO SPEND 15 MINUTES IN THE SUN DAILY.  - Vitamin D (25 hydroxy)  5. BMI 31.0-31.9,adult  Again, she agrees to referral to MWM.   - Amb Ref to Medical Weight Management  6. Dense breast tissue  Breast exam performed. The area of concern on right breast appears to be dense breast tissue. She is encouraged to perform monthly SBE and to let me or her GYN know if she has any further concerns. She reports she is up to date with annual mammogram. I will request her most recent Mammo from Dr. Mancel Bale.          Maximino Greenland, MD    THE PATIENT IS ENCOURAGED TO PRACTICE SOCIAL DISTANCING DUE TO THE COVID-19 PANDEMIC.

## 2019-01-06 ENCOUNTER — Other Ambulatory Visit: Payer: Self-pay | Admitting: Internal Medicine

## 2019-01-06 ENCOUNTER — Other Ambulatory Visit: Payer: Self-pay

## 2019-01-06 MED ORDER — VITAMIN D (ERGOCALCIFEROL) 1.25 MG (50000 UNIT) PO CAPS: ORAL_CAPSULE | ORAL | 0 refills | Status: DC

## 2019-02-01 ENCOUNTER — Ambulatory Visit (INDEPENDENT_AMBULATORY_CARE_PROVIDER_SITE_OTHER): Payer: BC Managed Care – PPO | Admitting: Bariatrics

## 2019-02-01 ENCOUNTER — Encounter: Payer: Self-pay | Admitting: Bariatrics

## 2019-02-01 ENCOUNTER — Encounter (INDEPENDENT_AMBULATORY_CARE_PROVIDER_SITE_OTHER): Payer: Self-pay | Admitting: Bariatrics

## 2019-02-01 ENCOUNTER — Other Ambulatory Visit: Payer: Self-pay

## 2019-02-01 VITALS — BP 128/86 | HR 80 | Temp 98.2°F | Ht 64.0 in | Wt 176.0 lb

## 2019-02-01 DIAGNOSIS — R5383 Other fatigue: Secondary | ICD-10-CM

## 2019-02-01 DIAGNOSIS — Z1331 Encounter for screening for depression: Secondary | ICD-10-CM | POA: Diagnosis not present

## 2019-02-01 DIAGNOSIS — Z0289 Encounter for other administrative examinations: Secondary | ICD-10-CM

## 2019-02-01 DIAGNOSIS — R7303 Prediabetes: Secondary | ICD-10-CM | POA: Diagnosis not present

## 2019-02-01 DIAGNOSIS — Z9189 Other specified personal risk factors, not elsewhere classified: Secondary | ICD-10-CM

## 2019-02-01 DIAGNOSIS — Z853 Personal history of malignant neoplasm of breast: Secondary | ICD-10-CM

## 2019-02-01 DIAGNOSIS — R0602 Shortness of breath: Secondary | ICD-10-CM | POA: Diagnosis not present

## 2019-02-01 DIAGNOSIS — E559 Vitamin D deficiency, unspecified: Secondary | ICD-10-CM | POA: Diagnosis not present

## 2019-02-01 DIAGNOSIS — Z6830 Body mass index (BMI) 30.0-30.9, adult: Secondary | ICD-10-CM

## 2019-02-01 DIAGNOSIS — E669 Obesity, unspecified: Secondary | ICD-10-CM

## 2019-02-01 NOTE — Progress Notes (Signed)
Office: [phone]  /  Fax: [phone]   Dear Dr. Glendale Chard,   Thank you for referring Gloria Chen to our clinic. The following note includes my evaluation and treatment recommendations.  HPI:   Chief Complaint: OBESITY    Gloria Chen has been referred by Dr. Glendale Chard for consultation regarding her obesity and obesity related comorbidities.    Gloria Chen (MR# [id number]) is a 58 y.o. female who presents on 02/01/2019 for obesity evaluation and treatment. Current BMI is Body mass index is 30.21 kg/m.Marland Kitchen Gloria Chen has been struggling with her weight for many years and has been unsuccessful in either losing weight, maintaining weight loss, or reaching her healthy weight goal.     Gloria Chen states she is currently in the action stage of change and ready to dedicate time achieving and maintaining a healthier weight. Gloria Chen is interested in becoming our patient and working on intensive lifestyle modifications including (but not limited to) diet, exercise and weight loss.     Gloria Chen likes to cook sometimes, but notes workload and fatigue as barriers. She craves microwave popcorn and chicken mostly in the evening.    Gloria Chen states her family eats meals together some she thinks her family will eat healthier with her her desired weight loss is 26 lbs she started gaining weight after menopause her heaviest weight ever was 184 lbs. she is a picky eater and doesn't like to eat healthier foods  she craves microwave popcorn and fried chicken  she snacks frequently in the evenings she frequently eats larger portions than normal  she has binge eating behaviors she struggles with emotional eating    Fatigue Gloria Chen feels her energy is lower than it should be. This has worsened with weight gain and has worsened recently. Gloria Chen denies daytime somnolence and  admits to waking up still tired. Patient is at risk for obstructive sleep apnea. Patent has a history of symptoms of morning headache. Patient  generally gets 5-6 hours of sleep per night, and states they generally do not sleep well most nights. Snoring is present. Apneic episodes are not present. Epworth Sleepiness Score is 11.  Dyspnea on exertion Gloria Chen notes increasing shortness of breath with exercising and seems to be worsening over time with weight gain. She notes getting out of breath sooner with activity than she used to. This has gotten worse recently. Gloria Chen denies orthopnea.  Vitamin D deficiency Gloria Chen has a diagnosis of Vitamin D deficiency. Last Vitamin D 20.6 on 10/17/2018. She is currently taking high dose Vit D and denies nausea, vomiting or muscle weakness.  Pre-Diabetes Gloria Chen has a diagnosis of prediabetes based on her elevated Hgb A1c and was informed this puts her at greater risk of developing diabetes. Last A1c 5.7 on 10/17/2018 with an insulin of 18.0. She has a family history of diabetes mellitus type II. She denies nausea or hypoglycemia.  At risk for diabetes Gloria Chen is at higher than average risk for developing diabetes due to her obesity. She currently denies polyuria or polydipsia.  History of Breast Cancer Gloria Chen has a history of breast cancer and underwent a mastectomy in February 1997.  Depression Screen Gloria Chen's Food and Mood (modified PHQ-9) score was 6. Depression screen PHQ 2/9 02/01/2019  Decreased Interest 1  Down, Depressed, Hopeless 0  PHQ - 2 Score 1  Altered sleeping 1  Tired, decreased energy 2  Change in appetite 1  Feeling bad or failure about yourself  0  Trouble concentrating 1  Moving slowly  or fidgety/restless 0  Suicidal thoughts 0  PHQ-9 Score 6  Difficult doing work/chores Not difficult at all    ASSESSMENT AND PLAN:  Other fatigue - Plan: EKG 12-Lead, T3, T4, free, TSH  Shortness of breath on exertion - Plan: Lipid Panel With LDL/HDL Ratio  Vitamin D deficiency - Plan: Vitamin D (25 hydroxy)  Prediabetes - Plan: Comprehensive Metabolic Panel (CMET), HgB A1c, Insulin, random   History of breast cancer in female  Depression screening  At risk for diabetes mellitus  Class 1 obesity with serious comorbidity and body mass index (BMI) of 30.0 to 30.9 in adult, unspecified obesity type  PLAN:  Fatigue Gloria Chen was informed that her fatigue may be related to obesity, depression or many other causes. Labs will be ordered, and in the meanwhile Gloria Chen has agreed to work on diet, exercise and weight loss to help with fatigue. Proper sleep hygiene was discussed including the need for 7-8 hours of quality sleep each night. A sleep study was not ordered based on symptoms and Epworth score. Gloria Chen will gradually increase activity.  Dyspnea on exertion Gloria Chen's shortness of breath appears to be obesity related and exercise induced. She has agreed to work on weight loss and gradually increase exercise to treat her exercise induced shortness of breath. If Gloria Chen follows our instructions and loses weight without improvement of her shortness of breath, we will plan to refer to pulmonology. We will monitor this condition regularly. Gloria Chen agrees to this plan.  Vitamin D Deficiency Gloria Chen was informed that low Vitamin D levels contributes to fatigue and are associated with obesity, breast, and colon cancer. She will have routine testing of Vitamin D and follow-up with our clinic in 2-3 weeks.  Pre-Diabetes Gloria Chen will continue to work on weight loss, exercise, and decreasing simple carbohydrates in her diet to help decrease the risk of diabetes. We dicussed metformin including benefits and risks. She was informed that eating too many simple carbohydrates or too many calories at one sitting increases the likelihood of GI side effects. Gloria Chen will have A1c and insulin levels checked. She will follow-up in 2-3 weeks for review of lab results.  Diabetes risk counseling Gloria Chen was given extended (15 minutes) diabetes prevention counseling today. She is 58 y.o. female and has risk factors for diabetes including  obesity. We discussed intensive lifestyle modifications today with an emphasis on weight loss as well as increasing exercise and decreasing simple carbohydrates in her diet.  History of Breast Cancer Gloria Chen will follow-up with her PCP and have mammograms once yearly.  Depression Screen Gloria Chen had a mildly positive depression screening. Depression is commonly associated with obesity and often results in emotional eating behaviors. We will monitor this closely and work on CBT to help improve the non-hunger eating patterns. Referral to Psychology may be required if no improvement is seen as she continues in our clinic.  Obesity Gloria Chen is currently in the action stage of change and her goal is to continue with weight loss efforts. I recommend Gloria Chen begin the structured treatment plan as follows:  She has agreed to follow the Category 2 plan + 100 calories. Gloria Chen will work on [employer], work on not doing excessive snacking at night, and will work on portion control.  Gloria Chen has been instructed to eventually work up to a goal of 150 minutes of combined cardio and strengthening exercise per week for weight loss and overall health benefits. We discussed the following Behavioral Modification Strategies today: increasing lean protein intake, decreasing simple  carbohydrates, increasing vegetables, increase H20 intake, decrease eating out, no skipping meals, work on meal planning and easy cooking plans, and keeping healthy foods in the home.   She was informed of the importance of frequent follow-up visits to maximize her success with intensive lifestyle modifications for her multiple health conditions. She was informed we would discuss her lab results at her next visit unless there is a critical issue that needs to be addressed sooner. Gloria Chen agreed to keep her next visit at the agreed upon time to discuss these results.  ALLERGIES: Allergies  Allergen Reactions  . Almond Oil   . Peanut-Containing Drug Products    . Soy Allergy   . Wheat Bran     MEDICATIONS: Current Outpatient Medications on File Prior to Visit  Medication Sig Dispense Refill  . Vitamin D, Ergocalciferol, (DRISDOL) 1.25 MG (50000 UT) CAPS capsule Take one capsule po twice weekly on Tuesdays/Fridays 8 capsule 0   No current facility-administered medications on file prior to visit.     PAST MEDICAL HISTORY: Past Medical History:  Diagnosis Date  . Allergy   . Cancer ([place])   . Constipation   . Edema, lower extremity   . Food allergy, peanut   . Lactose intolerance .  Marland Kitchen Personal history of chemotherapy   . Pre-diabetes   . Swelling of right ankle joint   . Vitamin D deficiency     PAST SURGICAL HISTORY: Past Surgical History:  Procedure Laterality Date  . BREAST SURGERY    . MASTECTOMY Left     SOCIAL HISTORY: Social History   Tobacco Use  . Smoking status: Never Smoker  . Smokeless tobacco: Never Used  Substance Use Topics  . Alcohol use: No  . Drug use: No    FAMILY HISTORY: Family History  Problem Relation Age of Onset  . Hypertension Mother   . Thyroid disease Mother   . Heart disease Father   . Hyperlipidemia Father   . Hypertension Father   . Stroke Father   . Diabetes Father   . Hypertension Sister   . Diabetes Sister   . Hypertension Brother   . Breast cancer Maternal Aunt    ROS: Review of Systems  Eyes:       Positive for wearing glasses or contacts.  Cardiovascular: Negative for orthopnea.  Gastrointestinal: Negative for nausea and vomiting.  Musculoskeletal: Positive for joint pain.       Negative for muscle weakness.  Neurological: Positive for headaches.  Endo/Heme/Allergies:       Negative for hypoglycemia.  Psychiatric/Behavioral: The patient has insomnia.        Positive for stress.   PHYSICAL EXAM: Blood pressure 128/86, pulse 80, temperature 98.2 F (36.8 C), height 5\' 4"  (1.626 m), weight 176 lb (79.8 kg), last menstrual period 03/16/2000, SpO2 96 %. Body mass  index is 30.21 kg/m. Physical Exam Vitals signs reviewed.  Constitutional:      Appearance: Normal appearance. She is well-developed. She is obese.  HENT:     Head: Normocephalic and atraumatic.     Nose: Nose normal.  Eyes:     General: No scleral icterus. Neck:     Musculoskeletal: Normal range of motion.  Cardiovascular:     Rate and Rhythm: Normal rate and regular rhythm.  Pulmonary:     Effort: Pulmonary effort is normal. No respiratory distress.  Abdominal:     Palpations: Abdomen is soft.     Tenderness: There is no abdominal tenderness.  Musculoskeletal: Normal  range of motion.     Comments: Range of motion normal in all four extremities.  Skin:    General: Skin is warm and dry.  Neurological:     Mental Status: She is alert and oriented to person, place, and time.     Coordination: Coordination normal.  Psychiatric:        Mood and Affect: Mood and affect normal.        Behavior: Behavior normal.   RECENT LABS AND TESTS: BMET    Component Value Date/Time   NA 141 10/17/2018 1052   K 4.2 10/17/2018 1052   CL 105 10/17/2018 1052   CO2 19 (L) 10/17/2018 1052   GLUCOSE 107 (H) 10/17/2018 1052   GLUCOSE 86 09/17/2010 1535   BUN 9 10/17/2018 1052   CREATININE 0.64 10/17/2018 1052   CALCIUM 10.1 10/17/2018 1052   GFRNONAA 100 10/17/2018 1052   GFRAA 115 10/17/2018 1052   Lab Results  Component Value Date   HGBA1C 5.7 (H) 10/17/2018   Lab Results  Component Value Date   INSULIN 18.0 10/17/2018   CBC    Component Value Date/Time   WBC 10.3 03/02/2018 1050   WBC 6.5 09/17/2010 1519   RBC 4.79 03/02/2018 1050   RBC 4.39 09/17/2010 1519   HGB 14.0 03/02/2018 1050   HCT 42.9 03/02/2018 1050   PLT 291 03/02/2018 1050   MCV 90 03/02/2018 1050   MCH 29.2 03/02/2018 1050   MCH 30.5 09/17/2010 1519   MCHC 32.6 03/02/2018 1050   MCHC 33.7 09/17/2010 1519   RDW 12.3 03/02/2018 1050   LYMPHSABS 2.3 09/17/2010 1519   MONOABS 0.7 09/17/2010 1519   EOSABS 0.1  09/17/2010 1519   BASOSABS 0.0 09/17/2010 1519   Iron/TIBC/Ferritin/ %Sat No results found for: IRON, TIBC, FERRITIN, IRONPCTSAT Lipid Panel     Component Value Date/Time   CHOL 170 03/02/2018 1050   TRIG 123 03/02/2018 1050   HDL 68 03/02/2018 1050   CHOLHDL 2.5 03/02/2018 1050   LDLCALC 77 03/02/2018 1050   Hepatic Function Panel     Component Value Date/Time   PROT 6.9 06/27/2018 1023   ALBUMIN 4.3 06/27/2018 1023   AST 18 06/27/2018 1023   ALT 29 06/27/2018 1023   ALKPHOS 81 06/27/2018 1023   BILITOT 0.2 06/27/2018 1023   BILIDIR 0.06 06/27/2018 1023   No results found for: TSH   Results for SHAYNA, FURRER (MRN [id number]) as of 02/01/2019 11:25  Ref. Range 10/17/2018 10:52  Vitamin D, 25-Hydroxy Latest Ref Range: 30.0 - 100.0 ng/mL 20.6 (L)   ECG shows sinus rhythm with a rate of 82 BPM. Nonspecific T-abnormality. Abnormal.  INDIRECT CALORIMETER done today shows a VO2 of 261 and a REE of 1817.  Her calculated basal metabolic rate is [id number] thus her basal metabolic rate is better than expected.  OBESITY BEHAVIORAL INTERVENTION VISIT  Today's visit was #1  Starting weight: 176 lbs Starting date: 02/01/2019 Today's weight: 176 lbs  Today's date: 02/01/2019 Total lbs lost to date: 0    02/01/2019  Height 5\' 4"  (1.626 m)  Weight 176 lb (79.8 kg)  BMI (Calculated) 30.2  BLOOD PRESSURE - SYSTOLIC 0000000  BLOOD PRESSURE - DIASTOLIC 86  Waist Measurement  38 inches   Body Fat % 71.2 %  Total Body Water (lbs) 68.4 lbs  RMR 1817   ASK: We discussed the diagnosis of obesity with Gloria Chen today and Arnelle agreed to give [place] permission to discuss obesity behavioral  modification therapy today.  ASSESS: Timari has the diagnosis of obesity and her BMI today is 30.3. Shamille is in the action stage of change.   ADVISE: Amme was educated on the multiple health risks of obesity as well as the benefit of weight loss to improve her health. She was advised of the need for long term  treatment and the importance of lifestyle modifications to improve her current health and to decrease her risk of future health problems.  AGREE: Multiple dietary modification options and treatment options were discussed and  Delena agreed to follow the recommendations documented in the above note.  ARRANGE: Ronnette was educated on the importance of frequent visits to treat obesity as outlined per CMS and USPSTF guidelines and agreed to schedule her next follow up appointment today.  Migdalia Dk, am acting as Location manager for [employer], DO   I have reviewed the above documentation for accuracy and completeness, and I agree with the above. -Jearld Lesch, DO

## 2019-02-02 LAB — COMPREHENSIVE METABOLIC PANEL
ALT: 32 IU/L (ref 0–32)
AST: 18 IU/L (ref 0–40)
Albumin/Globulin Ratio: 1.6 (ref 1.2–2.2)
Albumin: 4.8 g/dL (ref 3.8–4.9)
Alkaline Phosphatase: 85 IU/L (ref 39–117)
BUN/Creatinine Ratio: 21 (ref 9–23)
BUN: 12 mg/dL (ref 6–24)
Bilirubin Total: 0.4 mg/dL (ref 0.0–1.2)
CO2: 22 mmol/L (ref 20–29)
Calcium: 10.1 mg/dL (ref 8.7–10.2)
Chloride: 104 mmol/L (ref 96–106)
Creatinine, Ser: 0.56 mg/dL — ABNORMAL LOW (ref 0.57–1.00)
GFR calc Af Amer: 121 mL/min/{1.73_m2} (ref >59)
GFR calc non Af Amer: 105 mL/min/{1.73_m2} (ref >59)
Globulin, Total: 3 g/dL (ref 1.5–4.5)
Glucose: 97 mg/dL (ref 65–99)
Potassium: 4.2 mmol/L (ref 3.5–5.2)
Sodium: 141 mmol/L (ref 134–144)
Total Protein: 7.8 g/dL (ref 6.0–8.5)

## 2019-02-02 LAB — T3: T3, Total: 90 ng/dL (ref 71–180)

## 2019-02-02 LAB — LIPID PANEL WITH LDL/HDL RATIO
Cholesterol, Total: 199 mg/dL (ref 100–199)
HDL: 63 mg/dL (ref >39)
LDL Chol Calc (NIH): 116 mg/dL — ABNORMAL HIGH (ref 0–99)
LDL/HDL Ratio: 1.8 ratio (ref 0.0–3.2)
Triglycerides: 113 mg/dL (ref 0–149)
VLDL Cholesterol Cal: 20 mg/dL (ref 5–40)

## 2019-02-02 LAB — VITAMIN D 25 HYDROXY (VIT D DEFICIENCY, FRACTURES): Vit D, 25-Hydroxy: 49.3 ng/mL (ref 30.0–100.0)

## 2019-02-02 LAB — T4, FREE: Free T4: 1.47 ng/dL (ref 0.82–1.77)

## 2019-02-02 LAB — INSULIN, RANDOM: INSULIN: 16.2 u[IU]/mL (ref 2.6–24.9)

## 2019-02-02 LAB — HEMOGLOBIN A1C
Est. average glucose Bld gHb Est-mCnc: 114 mg/dL
Hgb A1c MFr Bld: 5.6 % (ref 4.8–5.6)

## 2019-02-02 LAB — TSH: TSH: 1.26 u[IU]/mL (ref 0.450–4.500)

## 2019-02-15 ENCOUNTER — Ambulatory Visit (INDEPENDENT_AMBULATORY_CARE_PROVIDER_SITE_OTHER): Payer: BC Managed Care – PPO | Admitting: Bariatrics

## 2019-02-15 ENCOUNTER — Other Ambulatory Visit: Payer: Self-pay

## 2019-02-15 ENCOUNTER — Encounter (INDEPENDENT_AMBULATORY_CARE_PROVIDER_SITE_OTHER): Payer: Self-pay | Admitting: Bariatrics

## 2019-02-15 VITALS — BP 146/76 | HR 75 | Temp 98.4°F | Ht 64.0 in | Wt 176.0 lb

## 2019-02-15 DIAGNOSIS — E6609 Other obesity due to excess calories: Secondary | ICD-10-CM

## 2019-02-15 DIAGNOSIS — E8881 Metabolic syndrome: Secondary | ICD-10-CM

## 2019-02-15 DIAGNOSIS — E669 Obesity, unspecified: Secondary | ICD-10-CM

## 2019-02-15 DIAGNOSIS — K219 Gastro-esophageal reflux disease without esophagitis: Secondary | ICD-10-CM

## 2019-02-15 DIAGNOSIS — E559 Vitamin D deficiency, unspecified: Secondary | ICD-10-CM

## 2019-02-15 DIAGNOSIS — Z6830 Body mass index (BMI) 30.0-30.9, adult: Secondary | ICD-10-CM

## 2019-02-15 NOTE — Progress Notes (Signed)
Office: [phone]  /  Fax: [phone]   HPI:   Chief Complaint: OBESITY Gloria Chen is here to discuss her progress with her obesity treatment plan. She is on the Category 2 plan and is following her eating plan approximately 80% of the time. She states she is exercising 0 minutes 0 times per week. Gloria Chen's weight has remained the same. She states that she did "get off track" with Thanksgiving. The plan is very doable. Her weight is 176 lb (79.8 kg) today and has not lost weight since her last visit. She has lost 0 lbs since starting treatment with [place].  Gastroesophageal Reflux Disease [name] has a diagnosis of gastroesophageal reflux disease, which is controlled. She is taking Pepcid and Prilosec.  Insulin Resistance, Improved [name] has a diagnosis of insulin resistance based on her elevated fasting insulin level >5. This is improved. Last A1c 5.6 on 02/01/2019 with an insulin of 16.2, which had previously been in the prediabetic range. Although Gloria Chen's blood glucose readings are still under good control, insulin resistance puts her at greater risk of metabolic syndrome and diabetes. She is not taking metformin currently and continues to work on diet and exercise to decrease risk of diabetes.  Vitamin D deficiency Gloria Chen has a diagnosis of Vitamin D deficiency. Vitamin D level was 20.6 on 10/17/2018 and 49.3 on 02/01/2019. She is currently taking high dose Vit D and denies nausea, vomiting or muscle weakness.  ASSESSMENT AND PLAN:  Gastroesophageal reflux disease, unspecified whether esophagitis present  Insulin resistance  Vitamin D deficiency  Class 1 obesity with serious comorbidity and body mass index (BMI) of 30.0 to 30.9 in adult, unspecified obesity type  PLAN:  Gastroesophageal Reflux Disease Mystie was advised that weight loss and improve GERD.  Insulin Resistance, Improved [name] will continue to work on weight loss, exercise, and decreasing simple carbohydrates in her diet to help  decrease the risk of diabetes. We dicussed metformin including benefits and risks. She was informed that eating too many simple carbohydrates or too many calories at one sitting increases the likelihood of GI side effects. [name] was given handout on Insulin Resistance and Prediabetes. She will follow-up as directed.  Vitamin D Deficiency [name] was informed that low Vitamin D levels contributes to fatigue and are associated with obesity, breast, and colon cancer. She agrees to start OTC Vit D @ 2,000 IU daily and will finish the prescription. She will follow-up for routine testing of Vitamin D, at least 2-3 times per year. She was informed of the risk of over-replacement of Vitamin D and agrees to not increase her dose unless she discusses this with [place] first. [name] agrees to follow-up with our clinic in 2-3 weeks.  I spent > than 50% of the 20 minute visit on counseling as documented in the note.  TIME SPENT: 35 minutes  Obesity Gloria Chen is currently in the action stage of change. As such, her goal is to continue with weight loss efforts. She has agreed to follow the Category 2 plan + 100 calories with breakfast options. We reviewed [name]'s labs (CMP, lipids, Vitamin D, A1c, insulin, and thyroid).  September will increase vegetables and fruit. She was given handouts on Making Smart Fruit choices, Insulin Resistance and Prediabetes, and Recipes. Gloria Chen has been instructed to work up to a goal of 150 minutes of combined cardio and strengthening exercise per week for weight loss and overall health benefits. We discussed the following Behavioral Modification Strategies today: increasing lean protein intake, decreasing simple carbohydrates, increasing  vegetables, increase H20 intake, decrease liquid calories, work on meal planning and easy cooking plans, keeping healthy foods in the home, ways to avoid boredom eating, travel eating strategies, holiday eating strategies, celebration eating strategies, and avoiding  temptations.  Gloria Chen has agreed to follow-up with our clinic in 2-3 weeks. She was informed of the importance of frequent follow-up visits to maximize her success with intensive lifestyle modifications for her multiple health conditions.  ALLERGIES: Allergies  Allergen Reactions  . Almond Oil   . Peanut-Containing Drug Products   . Soy Allergy   . Wheat Bran     MEDICATIONS: Current Outpatient Medications on File Prior to Visit  Medication Sig Dispense Refill  . Vitamin D, Ergocalciferol, (DRISDOL) 1.25 MG (50000 UT) CAPS capsule Take one capsule po twice weekly on Tuesdays/Fridays 8 capsule 0   No current facility-administered medications on file prior to visit.     PAST MEDICAL HISTORY: Past Medical History:  Diagnosis Date  . Allergy   . Cancer ([place])   . Constipation   . Edema, lower extremity   . Food allergy, peanut   . Lactose intolerance .  Marland Kitchen Personal history of chemotherapy   . Pre-diabetes   . Swelling of right ankle joint   . Vitamin D deficiency     PAST SURGICAL HISTORY: Past Surgical History:  Procedure Laterality Date  . BREAST SURGERY    . MASTECTOMY Left     SOCIAL HISTORY: Social History   Tobacco Use  . Smoking status: Never Smoker  . Smokeless tobacco: Never Used  Substance Use Topics  . Alcohol use: No  . Drug use: No    FAMILY HISTORY: Family History  Problem Relation Age of Onset  . Hypertension Mother   . Thyroid disease Mother   . Heart disease Father   . Hyperlipidemia Father   . Hypertension Father   . Stroke Father   . Diabetes Father   . Hypertension Sister   . Diabetes Sister   . Hypertension Brother   . Breast cancer Maternal Aunt    ROS: Review of Systems  Gastrointestinal: Negative for nausea and vomiting.       Positive for GERD.  Musculoskeletal:       Negative for muscle weakness.   PHYSICAL EXAM: Blood pressure (!) 146/76, pulse 75, temperature 98.4 F (36.9 C), height 5\' 4"  (1.626 m), weight 176 lb (79.8  kg), last menstrual period 03/16/2000, SpO2 98 %. Body mass index is 30.21 kg/m. Physical Exam Vitals signs reviewed.  Constitutional:      Appearance: Normal appearance. She is obese.  Cardiovascular:     Rate and Rhythm: Normal rate.     Pulses: Normal pulses.  Pulmonary:     Effort: Pulmonary effort is normal.     Breath sounds: Normal breath sounds.  Musculoskeletal: Normal range of motion.  Skin:    General: Skin is warm and dry.  Neurological:     Mental Status: She is alert and oriented to person, place, and time.  Psychiatric:        Behavior: Behavior normal.   RECENT LABS AND TESTS: BMET    Component Value Date/Time   NA 141 02/01/2019 0953   K 4.2 02/01/2019 0953   CL 104 02/01/2019 0953   CO2 22 02/01/2019 0953   GLUCOSE 97 02/01/2019 0953   GLUCOSE 86 09/17/2010 1535   BUN 12 02/01/2019 0953   CREATININE 0.56 (L) 02/01/2019 0953   CALCIUM 10.1 02/01/2019 [place]  105 02/01/2019 0953   GFRAA 121 02/01/2019 0953   Lab Results  Component Value Date   HGBA1C 5.6 02/01/2019   HGBA1C 5.7 (H) 10/17/2018   HGBA1C 5.7 (H) 03/02/2018   Lab Results  Component Value Date   INSULIN 16.2 02/01/2019   INSULIN 18.0 10/17/2018   CBC    Component Value Date/Time   WBC 10.3 03/02/2018 1050   WBC 6.5 09/17/2010 1519   RBC 4.79 03/02/2018 1050   RBC 4.39 09/17/2010 1519   HGB 14.0 03/02/2018 1050   HCT 42.9 03/02/2018 1050   PLT 291 03/02/2018 1050   MCV 90 03/02/2018 1050   MCH 29.2 03/02/2018 1050   MCH 30.5 09/17/2010 1519   MCHC 32.6 03/02/2018 1050   MCHC 33.7 09/17/2010 1519   RDW 12.3 03/02/2018 1050   LYMPHSABS 2.3 09/17/2010 1519   MONOABS 0.7 09/17/2010 1519   EOSABS 0.1 09/17/2010 1519   BASOSABS 0.0 09/17/2010 1519   Iron/TIBC/Ferritin/ %Sat No results found for: IRON, TIBC, FERRITIN, IRONPCTSAT Lipid Panel     Component Value Date/Time   CHOL 199 02/01/2019 0953   TRIG 113 02/01/2019 0953   HDL 63 02/01/2019 0953   CHOLHDL 2.5  03/02/2018 1050   LDLCALC 116 (H) 02/01/2019 0953   Hepatic Function Panel     Component Value Date/Time   PROT 7.8 02/01/2019 0953   ALBUMIN 4.8 02/01/2019 0953   AST 18 02/01/2019 0953   ALT 32 02/01/2019 0953   ALKPHOS 85 02/01/2019 0953   BILITOT 0.4 02/01/2019 0953   BILIDIR 0.06 06/27/2018 1023      Component Value Date/Time   TSH 1.260 02/01/2019 0953   Results for KAY, BOECK (MRN [id number]) as of 02/15/2019 16:51  Ref. Range 02/01/2019 09:53  Vitamin D, 25-Hydroxy Latest Ref Range: 30.0 - 100.0 ng/mL 49.3   OBESITY BEHAVIORAL INTERVENTION VISIT  Today's visit was #2  Starting weight: 176 lbs Starting date: 02/01/2019 Today's weight: 176 lbs  Today's date: 02/15/2019 Total lbs lost to date: 0     02/15/2019  Height 5\' 4"  (1.626 m)  Weight 176 lb (79.8 kg)  BMI (Calculated) 30.2  BLOOD PRESSURE - SYSTOLIC [id number]  BLOOD PRESSURE - DIASTOLIC 76   Body Fat % 99991111 %  Total Body Water (lbs) 70.4 lbs   ASK: We discussed the diagnosis of obesity with Rudean Haskell today and Larie agreed to give [place] permission to discuss obesity behavioral modification therapy today.  ASSESS: Aliz has the diagnosis of obesity and her BMI today is 30.3. Kyndra is in the action stage of change.   ADVISE: Taleaha was educated on the multiple health risks of obesity as well as the benefit of weight loss to improve her health. She was advised of the need for long term treatment and the importance of lifestyle modifications to improve her current health and to decrease her risk of future health problems.  AGREE: Multiple dietary modification options and treatment options were discussed and  Posey agreed to follow the recommendations documented in the above note.  ARRANGE: Marrian was educated on the importance of frequent visits to treat obesity as outlined per CMS and USPSTF guidelines and agreed to schedule her next follow up appointment today.  Migdalia Dk, am acting as Location manager for  [employer], DO  I have reviewed the above documentation for accuracy and completeness, and I agree with the above. -Jearld Lesch, DO

## 2019-02-16 ENCOUNTER — Encounter (INDEPENDENT_AMBULATORY_CARE_PROVIDER_SITE_OTHER): Payer: Self-pay | Admitting: Bariatrics

## 2019-02-16 DIAGNOSIS — Z6831 Body mass index (BMI) 31.0-31.9, adult: Secondary | ICD-10-CM | POA: Insufficient documentation

## 2019-02-16 DIAGNOSIS — E6609 Other obesity due to excess calories: Secondary | ICD-10-CM | POA: Insufficient documentation

## 2019-02-16 DIAGNOSIS — E66811 Obesity, class 1: Secondary | ICD-10-CM | POA: Insufficient documentation

## 2019-03-01 ENCOUNTER — Other Ambulatory Visit: Payer: Self-pay

## 2019-03-01 ENCOUNTER — Encounter (INDEPENDENT_AMBULATORY_CARE_PROVIDER_SITE_OTHER): Payer: Self-pay | Admitting: Bariatrics

## 2019-03-01 ENCOUNTER — Telehealth (INDEPENDENT_AMBULATORY_CARE_PROVIDER_SITE_OTHER): Payer: BC Managed Care – PPO | Admitting: Bariatrics

## 2019-03-01 DIAGNOSIS — E559 Vitamin D deficiency, unspecified: Secondary | ICD-10-CM | POA: Diagnosis not present

## 2019-03-01 DIAGNOSIS — Z6830 Body mass index (BMI) 30.0-30.9, adult: Secondary | ICD-10-CM

## 2019-03-01 DIAGNOSIS — E6609 Other obesity due to excess calories: Secondary | ICD-10-CM

## 2019-03-02 NOTE — Progress Notes (Signed)
Office: [phone]  /  Fax: [phone] TeleHealth Visit:  Gloria Chen has verbally consented to this TeleHealth visit today. The patient is located at home, the provider is located at the [facility] office. The participants in this visit include the listed provider and patient. The visit was conducted today via FaceTime.  HPI:  Chief Complaint: OBESITY Gloria Chen is here to discuss her progress with her obesity treatment plan. She is on the Category 2 plan and states she is following her eating plan approximately 90% of the time. She states she is exercising 0 minutes 0 times per week.  Gloria Chen has been doing okay. She "went off the wagon" for the holiday and reports her weight stayed the same. She reports doing well with her water intake and states she is aware of her portion control.  Today's visit was #3  Starting weight: 176 lbs Starting date: 02/01/2019  Vitamin D deficiency Gloria Chen has a diagnosis of Vitamin D deficiency and is taking Vitamin D. No nausea, vomiting, or muscle weakness.   ASSESSMENT AND PLAN:  Vitamin D deficiency  Class 1 obesity due to excess calories without serious comorbidity with body mass index (BMI) of 30.0 to 30.9 in adult  PLAN:  Vitamin D Deficiency Gloria Chen was informed that low Vitamin D levels contributes to fatigue and are associated with obesity, breast, and colon cancer. She agrees to continue Vit D and will follow-up for routine testing of Vitamin D, at least 2-3 times per year. She was informed of the risk of over-replacement of Vitamin D and agrees to not increase her dose unless she discusses this with [place] first. Aoki agrees to follow-up with our clinic in 3 weeks.  Obesity Gloria Chen is currently in the action stage of change. As such, her goal is to continue with weight loss efforts. She has agreed to follow the Category 2 plan. Gloria Chen will work on meal planning, intentional eating, will weigh herself at home (consider [employer]), and will  weigh her meats. Gloria Chen has been instructed to begin exercising (exercise bike) for weight loss and overall health benefits. We discussed the following Behavioral Modification Strategies today: increasing lean protein intake, decreasing simple carbohydrates, increasing vegetables, increase H20 intake, decrease eating out, work on meal planning and easy cooking plans, keeping healthy foods in the home, and ways to avoid boredom eating.  Gloria Chen has agreed to follow-up with our clinic in 3 weeks. She was informed of the importance of frequent follow-up visits to maximize her success with intensive lifestyle modifications for her multiple health conditions.  ALLERGIES: Allergies  Allergen Reactions  . Almond Oil   . Peanut-Containing Drug Products   . Soy Allergy   . Wheat Bran     MEDICATIONS: Current Outpatient Medications on File Prior to Visit  Medication Sig Dispense Refill  . Vitamin D, Ergocalciferol, (DRISDOL) 1.25 MG (50000 UT) CAPS capsule Take one capsule po twice weekly on Tuesdays/Fridays 8 capsule 0   No current facility-administered medications on file prior to visit.    PAST MEDICAL HISTORY: Past Medical History:  Diagnosis Date  . Allergy   . Cancer ([place])   . Constipation   . Edema, lower extremity   . Food allergy, peanut   . Lactose intolerance .  Marland Kitchen Personal history of chemotherapy   . Pre-diabetes   . Swelling of right ankle joint   . Vitamin D deficiency     PAST SURGICAL HISTORY: Past Surgical History:  Procedure Laterality Date  . BREAST  SURGERY    . MASTECTOMY Left     SOCIAL HISTORY: Social History   Tobacco Use  . Smoking status: Never Smoker  . Smokeless tobacco: Never Used  Substance Use Topics  . Alcohol use: No  . Drug use: No    FAMILY HISTORY: Family History  Problem Relation Age of Onset  . Hypertension Mother   . Thyroid disease Mother   . Heart disease Father   . Hyperlipidemia Father   . Hypertension Father   . Stroke Father    . Diabetes Father   . Hypertension Sister   . Diabetes Sister   . Hypertension Brother   . Breast cancer Maternal Aunt    ROS: Review of Systems  Gastrointestinal: Negative for nausea and vomiting.  Musculoskeletal:       Negative for muscle weakness.   PHYSICAL EXAM: Last menstrual period 03/16/2000. There is no height or weight on file to calculate BMI. Physical Exam: Pt in no acute distress.  RECENT LABS AND TESTS: BMET    Component Value Date/Time   NA 141 02/01/2019 0953   K 4.2 02/01/2019 0953   CL 104 02/01/2019 0953   CO2 22 02/01/2019 0953   GLUCOSE 97 02/01/2019 0953   GLUCOSE 86 09/17/2010 1535   BUN 12 02/01/2019 0953   CREATININE 0.56 (L) 02/01/2019 0953   CALCIUM 10.1 02/01/2019 0953   GFRNONAA 105 02/01/2019 0953   GFRAA 121 02/01/2019 0953   Lab Results  Component Value Date   HGBA1C 5.6 02/01/2019   HGBA1C 5.7 (H) 10/17/2018   HGBA1C 5.7 (H) 03/02/2018   Lab Results  Component Value Date   INSULIN 16.2 02/01/2019   INSULIN 18.0 10/17/2018   CBC    Component Value Date/Time   WBC 10.3 03/02/2018 1050   WBC 6.5 09/17/2010 1519   RBC 4.79 03/02/2018 1050   RBC 4.39 09/17/2010 1519   HGB 14.0 03/02/2018 1050   HCT 42.9 03/02/2018 1050   PLT 291 03/02/2018 1050   MCV 90 03/02/2018 1050   MCH 29.2 03/02/2018 1050   MCH 30.5 09/17/2010 1519   MCHC 32.6 03/02/2018 1050   MCHC 33.7 09/17/2010 1519   RDW 12.3 03/02/2018 1050   LYMPHSABS 2.3 09/17/2010 1519   MONOABS 0.7 09/17/2010 1519   EOSABS 0.1 09/17/2010 1519   BASOSABS 0.0 09/17/2010 1519   Iron/TIBC/Ferritin/ %Sat No results found for: IRON, TIBC, FERRITIN, IRONPCTSAT Lipid Panel     Component Value Date/Time   CHOL 199 02/01/2019 0953   TRIG 113 02/01/2019 0953   HDL 63 02/01/2019 0953   CHOLHDL 2.5 03/02/2018 1050   LDLCALC 116 (H) 02/01/2019 0953   Hepatic Function Panel     Component Value Date/Time   PROT 7.8 02/01/2019 0953   ALBUMIN 4.8 02/01/2019 0953   AST 18  02/01/2019 0953   ALT 32 02/01/2019 0953   ALKPHOS 85 02/01/2019 0953   BILITOT 0.4 02/01/2019 0953   BILIDIR 0.06 06/27/2018 1023      Component Value Date/Time   TSH 1.260 02/01/2019 0953    OBESITY BEHAVIORAL INTERVENTION VISIT DOCUMENTATION FOR INSURANCE (~15 minutes)  I, Michaelene Song, am acting as Location manager for [employer], DO  I have reviewed the above documentation for accuracy and completeness, and I agree with the above. Jearld Lesch, DO

## 2019-03-06 ENCOUNTER — Encounter: Payer: BC Managed Care – PPO | Admitting: Internal Medicine

## 2019-03-06 ENCOUNTER — Encounter: Payer: Self-pay | Admitting: Internal Medicine

## 2019-03-08 ENCOUNTER — Other Ambulatory Visit: Payer: Self-pay | Admitting: Internal Medicine

## 2019-03-28 ENCOUNTER — Ambulatory Visit (INDEPENDENT_AMBULATORY_CARE_PROVIDER_SITE_OTHER): Payer: BC Managed Care – PPO | Admitting: Internal Medicine

## 2019-03-28 ENCOUNTER — Encounter: Payer: Self-pay | Admitting: Internal Medicine

## 2019-03-28 ENCOUNTER — Other Ambulatory Visit: Payer: Self-pay

## 2019-03-28 VITALS — BP 118/66 | HR 85 | Temp 98.7°F | Ht 63.8 in | Wt 181.4 lb

## 2019-03-28 DIAGNOSIS — E6609 Other obesity due to excess calories: Secondary | ICD-10-CM

## 2019-03-28 DIAGNOSIS — Z6831 Body mass index (BMI) 31.0-31.9, adult: Secondary | ICD-10-CM

## 2019-03-28 DIAGNOSIS — Z860100 Personal history of colon polyps, unspecified: Secondary | ICD-10-CM

## 2019-03-28 DIAGNOSIS — Z0000 Encounter for general adult medical examination without abnormal findings: Secondary | ICD-10-CM | POA: Diagnosis not present

## 2019-03-28 LAB — CBC
Hematocrit: 41.1 % (ref 34.0–46.6)
Hemoglobin: 13.6 g/dL (ref 11.1–15.9)
MCH: 29.9 pg (ref 26.6–33.0)
MCHC: 33.1 g/dL (ref 31.5–35.7)
MCV: 90 fL (ref 79–97)
Platelets: 257 10*3/uL (ref 150–450)
RBC: 4.55 x10E6/uL (ref 3.77–5.28)
RDW: 12.3 % (ref 11.7–15.4)
WBC: 5.8 10*3/uL (ref 3.4–10.8)

## 2019-03-29 ENCOUNTER — Encounter (INDEPENDENT_AMBULATORY_CARE_PROVIDER_SITE_OTHER): Payer: Self-pay | Admitting: Bariatrics

## 2019-03-29 ENCOUNTER — Ambulatory Visit (INDEPENDENT_AMBULATORY_CARE_PROVIDER_SITE_OTHER): Payer: BC Managed Care – PPO | Admitting: Bariatrics

## 2019-03-29 VITALS — BP 128/89 | HR 79 | Temp 98.6°F | Ht 64.0 in | Wt 178.0 lb

## 2019-03-29 DIAGNOSIS — Z6830 Body mass index (BMI) 30.0-30.9, adult: Secondary | ICD-10-CM

## 2019-03-29 DIAGNOSIS — E8881 Metabolic syndrome: Secondary | ICD-10-CM

## 2019-03-29 DIAGNOSIS — E6609 Other obesity due to excess calories: Secondary | ICD-10-CM | POA: Diagnosis not present

## 2019-03-29 DIAGNOSIS — E559 Vitamin D deficiency, unspecified: Secondary | ICD-10-CM

## 2019-03-30 ENCOUNTER — Encounter: Payer: Self-pay | Admitting: Internal Medicine

## 2019-03-30 NOTE — Progress Notes (Signed)
This visit occurred during the SARS-CoV-2 public health emergency.  Safety protocols were in place, including screening questions prior to the visit, additional usage of staff PPE, and extensive cleaning of exam room while observing appropriate contact time as indicated for disinfecting solutions.  Subjective:     Patient ID: Gloria Chen , female    DOB: [birth year] , 59 y.o.   MRN: [id number]   Chief Complaint  Patient presents with  . Annual Exam    HPI  She is here today for a full physical exam. She has her pelvic exams performed by her GYN. She has no specific concerns or complaints at this time.     Past Medical History:  Diagnosis Date  . Allergy   . Cancer ([place])   . Constipation   . Edema, lower extremity   . Food allergy, peanut   . Lactose intolerance .  Marland Kitchen Personal history of chemotherapy   . Pre-diabetes   . Swelling of right ankle joint   . Vitamin D deficiency      Family History  Problem Relation Age of Onset  . Hypertension Mother   . Thyroid disease Mother   . Heart disease Father   . Hyperlipidemia Father   . Hypertension Father   . Stroke Father   . Diabetes Father   . Hypertension Sister   . Diabetes Sister   . Hypertension Brother   . Breast cancer Maternal Aunt      Current Outpatient Medications:  Marland Kitchen  Vitamin D, Ergocalciferol, (DRISDOL) 1.25 MG (50000 UT) CAPS capsule, TAKE ONE CAPSULE BY MOUTH TWICE WEEKLY ON TUESDAYS/FRIDAYS, Disp: 8 capsule, Rfl: 0   Allergies  Allergen Reactions  . Almond Oil   . Peanut-Containing Drug Products   . Soy Allergy   . Wheat Bran      The patient states she uses none for birth control. Last LMP was Patient's last menstrual period was 03/16/2000.. Negative for Dysmenorrhea  Negative for: breast discharge, breast lump(s), breast pain and breast self exam. Associated symptoms include abnormal vaginal bleeding. Pertinent negatives include abnormal bleeding (hematology), anxiety, decreased libido, depression,  difficulty falling sleep, dyspareunia, history of infertility, nocturia, sexual dysfunction, sleep disturbances, urinary incontinence, urinary urgency, vaginal discharge and vaginal itching. Diet regular.    . The patient's tobacco use is:  Social History   Tobacco Use  Smoking Status Never Smoker  Smokeless Tobacco Never Used  . She has been exposed to passive smoke. The patient's alcohol use is:  Social History   Substance and Sexual Activity  Alcohol Use No    Review of Systems  Constitutional: Negative.   HENT: Negative.   Eyes: Negative.   Respiratory: Negative.   Cardiovascular: Negative.   Endocrine: Negative.   Genitourinary: Negative.   Musculoskeletal: Negative.   Skin: Negative.   Allergic/Immunologic: Negative.   Neurological: Negative.   Hematological: Negative.   Psychiatric/Behavioral: Negative.      Today's Vitals   03/28/19 1535  BP: 118/66  Pulse: 85  Temp: 98.7 F (37.1 C)  Weight: 181 lb 6.4 oz (82.3 kg)  Height: 5' 3.8" (1.621 m)   Body mass index is 31.33 kg/m.   Objective:  Physical Exam Vitals and nursing note reviewed.  Constitutional:      Appearance: Normal appearance.  HENT:     Head: Normocephalic and atraumatic.     Right Ear: Tympanic membrane, ear canal and external ear normal.     Left Ear: Tympanic membrane, ear canal and external  ear normal.     Nose: Nose normal.     Mouth/Throat:     Mouth: Mucous membranes are moist.     Pharynx: Oropharynx is clear.  Eyes:     Extraocular Movements: Extraocular movements intact.     Conjunctiva/sclera: Conjunctivae normal.     Pupils: Pupils are equal, round, and reactive to light.  Cardiovascular:     Rate and Rhythm: Normal rate and regular rhythm.     Pulses: Normal pulses.     Heart sounds: Normal heart sounds.  Pulmonary:     Effort: Pulmonary effort is normal.     Breath sounds: Normal breath sounds.  Chest:     Comments: She elected to wear bra during her exam; therefore,  breast exam not performed.  Abdominal:     General: Bowel sounds are normal.     Palpations: Abdomen is soft.     Comments: Rounded, soft.   Genitourinary:    Comments: deferred Musculoskeletal:        General: Normal range of motion.     Cervical back: Normal range of motion and neck supple.  Skin:    General: Skin is warm and dry.  Neurological:     General: No focal deficit present.     Mental Status: She is alert and oriented to person, place, and time.  Psychiatric:        Mood and Affect: Mood normal.        Behavior: Behavior normal.         Assessment And Plan:     1. Routine general medical examination at health care facility  A full exam was performed.  Importance of monthly self breast exams was discussed with the patient.  PATIENT IS ADVISED TO GET 30-45 MINUTES REGULAR EXERCISE NO LESS THAN FOUR TO FIVE DAYS PER WEEK - BOTH WEIGHTBEARING EXERCISES AND AEROBIC ARE RECOMMENDED.  SHE IS ADVISED TO FOLLOW A HEALTHY DIET WITH AT LEAST SIX FRUITS/VEGGIES PER DAY, DECREASE INTAKE OF RED MEAT, AND TO INCREASE FISH INTAKE TO TWO DAYS PER WEEK.  MEATS/FISH SHOULD NOT BE FRIED, BAKED OR BROILED IS PREFERABLE.  I SUGGEST WEARING SPF 50 SUNSCREEN ON EXPOSED PARTS AND ESPECIALLY WHEN IN THE DIRECT SUNLIGHT FOR AN EXTENDED PERIOD OF TIME.  PLEASE AVOID FAST FOOD RESTAURANTS AND INCREASE YOUR WATER INTAKE.  - CBC no Diff  2. Class 1 obesity due to excess calories without serious comorbidity with body mass index (BMI) of 31.0 to 31.9 in adult  Wt Readings from Last 3 Encounters:  03/29/19 178 lb (80.7 kg)  03/28/19 181 lb 6.4 oz (82.3 kg)  02/15/19 176 lb (79.8 kg)   She has lost four pounds since her last visit August 2020.  She was congratulated on her weight loss thus far and encouraged to keep up the great work. She is encouraged to strive for at least 150 minutes of exercise per week.   3. Personal history of colonic polyps  I will refer her to GI for CRC screening. Her last  colonoscopy was performed at [facility] in 2017, results reviewed during her visit.   - Ambulatory referral to Gastroenterology     Maximino Greenland, MD    THE PATIENT IS ENCOURAGED TO PRACTICE SOCIAL DISTANCING DUE TO THE COVID-19 PANDEMIC.

## 2019-03-30 NOTE — Patient Instructions (Signed)
Health Maintenance, Female Adopting a healthy lifestyle and getting preventive care are important in promoting health and wellness. Ask your health care provider about:  The right schedule for you to have regular tests and exams.  Things you can do on your own to prevent diseases and keep yourself healthy. What should I know about diet, weight, and exercise? Eat a healthy diet   Eat a diet that includes plenty of vegetables, fruits, low-fat dairy products, and lean protein.  Do not eat a lot of foods that are high in solid fats, added sugars, or sodium. Maintain a healthy weight Body mass index (BMI) is used to identify weight problems. It estimates body fat based on height and weight. Your health care provider can help determine your BMI and help you achieve or maintain a healthy weight. Get regular exercise Get regular exercise. This is one of the most important things you can do for your health. Most adults should:  Exercise for at least 150 minutes each week. The exercise should increase your heart rate and make you sweat (moderate-intensity exercise).  Do strengthening exercises at least twice a week. This is in addition to the moderate-intensity exercise.  Spend less time sitting. Even light physical activity can be beneficial. Watch cholesterol and blood lipids Have your blood tested for lipids and cholesterol at 57 years of age, then have this test every 5 years. Have your cholesterol levels checked more often if:  Your lipid or cholesterol levels are high.  You are older than 57 years of age.  You are at high risk for heart disease. What should I know about cancer screening? Depending on your health history and family history, you may need to have cancer screening at various ages. This may include screening for:  Breast cancer.  Cervical cancer.  Colorectal cancer.  Skin cancer.  Lung cancer. What should I know about heart disease, diabetes, and high blood  pressure? Blood pressure and heart disease  High blood pressure causes heart disease and increases the risk of stroke. This is more likely to develop in people who have high blood pressure readings, are of African descent, or are overweight.  Have your blood pressure checked: ? Every 3-5 years if you are 18-39 years of age. ? Every year if you are 40 years old or older. Diabetes Have regular diabetes screenings. This checks your fasting blood sugar level. Have the screening done:  Once every three years after age 40 if you are at a normal weight and have a low risk for diabetes.  More often and at a younger age if you are overweight or have a high risk for diabetes. What should I know about preventing infection? Hepatitis B If you have a higher risk for hepatitis B, you should be screened for this virus. Talk with your health care provider to find out if you are at risk for hepatitis B infection. Hepatitis C Testing is recommended for:  Everyone born from 1945 through 1965.  Anyone with known risk factors for hepatitis C. Sexually transmitted infections (STIs)  Get screened for STIs, including gonorrhea and chlamydia, if: ? You are sexually active and are younger than 57 years of age. ? You are older than 57 years of age and your health care provider tells you that you are at risk for this type of infection. ? Your sexual activity has changed since you were last screened, and you are at increased risk for chlamydia or gonorrhea. Ask your health care provider if   you are at risk.  Ask your health care provider about whether you are at high risk for HIV. Your health care provider may recommend a prescription medicine to help prevent HIV infection. If you choose to take medicine to prevent HIV, you should first get tested for HIV. You should then be tested every 3 months for as long as you are taking the medicine. Pregnancy  If you are about to stop having your period (premenopausal) and  you may become pregnant, seek counseling before you get pregnant.  Take 400 to 800 micrograms (mcg) of folic acid every day if you become pregnant.  Ask for birth control (contraception) if you want to prevent pregnancy. Osteoporosis and menopause Osteoporosis is a disease in which the bones lose minerals and strength with aging. This can result in bone fractures. If you are 65 years old or older, or if you are at risk for osteoporosis and fractures, ask your health care provider if you should:  Be screened for bone loss.  Take a calcium or vitamin D supplement to lower your risk of fractures.  Be given hormone replacement therapy (HRT) to treat symptoms of menopause. Follow these instructions at home: Lifestyle  Do not use any products that contain nicotine or tobacco, such as cigarettes, e-cigarettes, and chewing tobacco. If you need help quitting, ask your health care provider.  Do not use street drugs.  Do not share needles.  Ask your health care provider for help if you need support or information about quitting drugs. Alcohol use  Do not drink alcohol if: ? Your health care provider tells you not to drink. ? You are pregnant, may be pregnant, or are planning to become pregnant.  If you drink alcohol: ? Limit how much you use to 0-1 drink a day. ? Limit intake if you are breastfeeding.  Be aware of how much alcohol is in your drink. In the [place] one drink equals one 12 oz bottle of beer (355 mL), one 5 oz glass of wine (148 mL), or one 1 oz glass of hard liquor (44 mL). General instructions  Schedule regular health, dental, and eye exams.  Stay current with your vaccines.  Tell your health care provider if: ? You often feel depressed. ? You have ever been abused or do not feel safe at home. Summary  Adopting a healthy lifestyle and getting preventive care are important in promoting health and wellness.  Follow your health care provider's instructions about healthy  diet, exercising, and getting tested or screened for diseases.  Follow your health care provider's instructions on monitoring your cholesterol and blood pressure. This information is not intended to replace advice given to you by your health care provider. Make sure you discuss any questions you have with your health care provider. Document Revised: 02/23/2018 Document Reviewed: 02/23/2018 Elsevier Patient Education  2020 Elsevier Inc.  

## 2019-04-03 ENCOUNTER — Encounter (INDEPENDENT_AMBULATORY_CARE_PROVIDER_SITE_OTHER): Payer: Self-pay | Admitting: Bariatrics

## 2019-04-03 NOTE — Progress Notes (Signed)
Chief Complaint:   OBESITY Gloria Chen is here to discuss her progress with her obesity treatment plan along with follow-up of her obesity related diagnoses. [name] is on the Category 2 Plan and states she is following her eating plan approximately 25% of the time. Akela states she is exercising 0 minutes 0 times per week.  Today's visit was #: 4 Starting weight: 176 lbs Starting date: 02/01/2019 Today's weight: 178 lbs Today's date: 03/29/2019 Total lbs lost to date: 0 Total lbs lost since last in-office visit: 0  Interim History: Gloria Chen is up 2 lbs. She is more aware of her protein and decreased carbohydrates. She has increased her vegetable intake and is doing well with her water.  Subjective:   Insulin resistance.  Roselin has a diagnosis of insulin resistance based on her elevated fasting insulin level >5. She continues to work on diet and exercise to decrease her risk of diabetes. She is on no medications. Appetite normal.  Lab Results  Component Value Date   INSULIN 16.2 02/01/2019   INSULIN 18.0 10/17/2018   Lab Results  Component Value Date   HGBA1C 5.6 02/01/2019   Vitamin D deficiency. Gloria Chen is not taking her Vitamin D twice weekly. No nausea, vomiting, or muscle weakness. Last Vitamin D level 49.3 on 02/01/2019.  Assessment/Plan:   Insulin resistance. Gloria Chen will continue to work on weight loss, exercise, increasing protein and healthy fats, and decreasing simple carbohydrates to help decrease the risk of diabetes. Gloria Chen agreed to follow-up with [place] as directed to closely monitor her progress.  Vitamin D deficiency. Low Vitamin D level contributes to fatigue and are associated with obesity, breast, and colon cancer. She agrees to continue to take Vitamin D once weekly per her PCP and will follow-up for routine testing of Vitamin D, at least 2-3 times per year to avoid over-replacement.  Class 1 obesity due to excess calories without serious comorbidity with body mass index  (BMI) of 30.0 to 30.9 in adult.  Gloria Chen is currently in the action stage of change. As such, her goal is to continue with weight loss efforts. She has agreed to on the Category 2 Plan.   She will work on meal planning, intentional eating (will be more mindful), and will be adherent to the plan.  Exercise goals: Gloria Chen will start exercising at home and will jump rope.  Behavioral modification strategies: increasing lean protein intake, decreasing simple carbohydrates, increasing vegetables, increasing water intake, decreasing eating out, no skipping meals, meal planning and cooking strategies and keeping healthy foods in the home.  Gloria Chen has agreed to follow-up with our clinic in 2 weeks. She was informed of the importance of frequent follow-up visits to maximize her success with intensive lifestyle modifications for her multiple health conditions.   Objective:   Blood pressure 128/89, pulse 79, temperature 98.6 F (37 C), height 5\' 4"  (1.626 m), weight 178 lb (80.7 kg), last menstrual period 03/16/2000, SpO2 96 %. Body mass index is 30.55 kg/m.  General: Cooperative, alert, well developed, in no acute distress. HEENT: Conjunctivae and lids unremarkable. Cardiovascular: Regular rhythm.  Lungs: Normal work of breathing. Neurologic: No focal deficits.   Lab Results  Component Value Date   CREATININE 0.56 (L) 02/01/2019   BUN 12 02/01/2019   NA 141 02/01/2019   K 4.2 02/01/2019   CL 104 02/01/2019   CO2 22 02/01/2019   Lab Results  Component Value Date   ALT 32 02/01/2019   AST 18 02/01/2019  ALKPHOS 85 02/01/2019   BILITOT 0.4 02/01/2019   Lab Results  Component Value Date   HGBA1C 5.6 02/01/2019   HGBA1C 5.7 (H) 10/17/2018   HGBA1C 5.7 (H) 03/02/2018   Lab Results  Component Value Date   INSULIN 16.2 02/01/2019   INSULIN 18.0 10/17/2018   Lab Results  Component Value Date   TSH 1.260 02/01/2019   Lab Results  Component Value Date   CHOL 199 02/01/2019   HDL 63  02/01/2019   LDLCALC 116 (H) 02/01/2019   TRIG 113 02/01/2019   CHOLHDL 2.5 03/02/2018   Lab Results  Component Value Date   WBC 5.8 03/28/2019   HGB 13.6 03/28/2019   HCT 41.1 03/28/2019   MCV 90 03/28/2019   PLT 257 03/28/2019   No results found for: IRON, TIBC, FERRITIN  Attestation Statements:   Reviewed by clinician on day of visit: allergies, medications, problem list, medical history, surgical history, family history, social history, and previous encounter notes.  Time spent on visit including pre-visit chart review and post-visit care was 20 minutes.   Migdalia Dk, am acting as Location manager for [employer], DO  I have reviewed the above documentation for accuracy and completeness, and I agree with the above. Jearld Lesch, DO

## 2019-04-12 ENCOUNTER — Encounter: Payer: BC Managed Care – PPO | Admitting: Internal Medicine

## 2019-04-13 ENCOUNTER — Ambulatory Visit (INDEPENDENT_AMBULATORY_CARE_PROVIDER_SITE_OTHER): Payer: BC Managed Care – PPO | Admitting: Bariatrics

## 2019-04-19 ENCOUNTER — Ambulatory Visit (INDEPENDENT_AMBULATORY_CARE_PROVIDER_SITE_OTHER): Payer: BC Managed Care – PPO | Admitting: Bariatrics

## 2019-05-20 ENCOUNTER — Ambulatory Visit: Payer: BC Managed Care – PPO

## 2019-05-20 ENCOUNTER — Encounter: Payer: Self-pay | Admitting: Internal Medicine

## 2019-05-22 ENCOUNTER — Other Ambulatory Visit: Payer: Self-pay

## 2019-05-22 MED ORDER — VITAMIN D (ERGOCALCIFEROL) 1.25 MG (50000 UNIT) PO CAPS: ORAL_CAPSULE | ORAL | 3 refills | Status: DC

## 2019-05-29 ENCOUNTER — Ambulatory Visit: Payer: BC Managed Care – PPO | Attending: Internal Medicine

## 2019-05-29 DIAGNOSIS — Z23 Encounter for immunization: Secondary | ICD-10-CM

## 2019-05-29 NOTE — Progress Notes (Signed)
   Covid-19 Vaccination Clinic  Name:  ARLEENE MENTING    MRN: [id number] DOB: [birth year]  05/29/2019  Ms. Kluger was observed post Covid-19 immunization for 15 minutes without incident. She was provided with Vaccine Information Sheet and instruction to access the V-Safe system.   Ms. Landeck was instructed to call 911 with any severe reactions post vaccine: Marland Kitchen Difficulty breathing  . Swelling of face and throat  . A fast heartbeat  . A bad rash all over body  . Dizziness and weakness   Immunizations Administered    Name Date Dose VIS Date Route   Pfizer COVID-19 Vaccine 05/29/2019  4:10 PM 0.3 mL 02/24/2019 Intramuscular   Manufacturer: [place]   Lot: [id number]   [place]: [id number]

## 2019-06-02 ENCOUNTER — Encounter: Payer: Self-pay | Admitting: Internal Medicine

## 2019-06-20 ENCOUNTER — Ambulatory Visit: Payer: BC Managed Care – PPO | Attending: Internal Medicine

## 2019-06-20 ENCOUNTER — Ambulatory Visit: Payer: BC Managed Care – PPO

## 2019-06-20 DIAGNOSIS — Z23 Encounter for immunization: Secondary | ICD-10-CM

## 2019-06-20 NOTE — Progress Notes (Signed)
   Covid-19 Vaccination Clinic  Name:  Gloria Chen    MRN: [id number] DOB: [birth year]  06/20/2019  [name] was observed post Covid-19 immunization for 15 minutes without incident. She was provided with Vaccine Information Sheet and instruction to access the V-Safe system.   [name] was instructed to call 911 with any severe reactions post vaccine: Marland Kitchen Difficulty breathing  . Swelling of face and throat  . A fast heartbeat  . A bad rash all over body  . Dizziness and weakness   Immunizations Administered    Name Date Dose VIS Date Route   Pfizer COVID-19 Vaccine 06/20/2019  4:57 PM 0.3 mL 02/24/2019 Intramuscular   Manufacturer: [place]   Lot: [id number]   [place]: [id number]

## 2019-07-10 LAB — HM COLONOSCOPY

## 2019-07-24 ENCOUNTER — Encounter: Payer: Self-pay | Admitting: Internal Medicine

## 2019-08-01 ENCOUNTER — Telehealth: Payer: Self-pay

## 2019-08-01 NOTE — Telephone Encounter (Signed)
PT LVM REQ APPT ATT TO CONTACT PT TO [facility]NO ANS LVM TO CALL OFC

## 2019-08-13 ENCOUNTER — Other Ambulatory Visit: Payer: Self-pay | Admitting: Internal Medicine

## 2019-08-16 LAB — HM MAMMOGRAPHY

## 2019-08-16 LAB — HM PAP SMEAR

## 2019-12-05 ENCOUNTER — Ambulatory Visit: Payer: BC Managed Care – PPO | Admitting: Internal Medicine

## 2019-12-07 ENCOUNTER — Ambulatory Visit: Payer: BC Managed Care – PPO | Admitting: Internal Medicine

## 2020-02-02 ENCOUNTER — Ambulatory Visit: Payer: BC Managed Care – PPO

## 2020-02-20 ENCOUNTER — Ambulatory Visit: Payer: BC Managed Care – PPO

## 2020-03-19 ENCOUNTER — Other Ambulatory Visit: Payer: Self-pay

## 2020-03-19 ENCOUNTER — Ambulatory Visit: Payer: 59 | Attending: Internal Medicine

## 2020-03-19 DIAGNOSIS — Z23 Encounter for immunization: Secondary | ICD-10-CM

## 2020-03-19 NOTE — Progress Notes (Signed)
   Covid-19 Vaccination Clinic  Name:  Gloria Chen    MRN: [id number] DOB: 03-31-1960  03/19/2020  [name] was observed post Covid-19 immunization for 15 minutes without incident. She was provided with Vaccine Information Sheet and instruction to access the V-Safe system.   [name] was instructed to call 911 with any severe reactions post vaccine: Marland Kitchen Difficulty breathing  . Swelling of face and throat  . A fast heartbeat  . A bad rash all over body  . Dizziness and weakness   Immunizations Administered    Name Date Dose VIS Date Route   Pfizer COVID-19 Vaccine 03/19/2020  1:32 PM 0.3 mL 01/03/2020 Intramuscular   Manufacturer: [employer]   Lot: [id number]   NDC: [address]-3

## 2020-04-03 ENCOUNTER — Encounter: Payer: Self-pay | Admitting: Internal Medicine

## 2020-04-03 ENCOUNTER — Ambulatory Visit (INDEPENDENT_AMBULATORY_CARE_PROVIDER_SITE_OTHER): Payer: BC Managed Care – PPO | Admitting: Internal Medicine

## 2020-04-03 ENCOUNTER — Other Ambulatory Visit: Payer: Self-pay

## 2020-04-03 VITALS — BP 112/76 | HR 74 | Temp 98.4°F | Ht 62.8 in | Wt 182.4 lb

## 2020-04-03 DIAGNOSIS — E6609 Other obesity due to excess calories: Secondary | ICD-10-CM | POA: Diagnosis not present

## 2020-04-03 DIAGNOSIS — F4320 Adjustment disorder, unspecified: Secondary | ICD-10-CM | POA: Diagnosis not present

## 2020-04-03 DIAGNOSIS — Z6832 Body mass index (BMI) 32.0-32.9, adult: Secondary | ICD-10-CM

## 2020-04-03 DIAGNOSIS — E559 Vitamin D deficiency, unspecified: Secondary | ICD-10-CM

## 2020-04-03 DIAGNOSIS — Z0000 Encounter for general adult medical examination without abnormal findings: Secondary | ICD-10-CM | POA: Diagnosis not present

## 2020-04-03 DIAGNOSIS — R42 Dizziness and giddiness: Secondary | ICD-10-CM

## 2020-04-03 NOTE — Progress Notes (Signed)
I,[name],acting as a Education administrator for Maximino Greenland, MD.,have documented all relevant documentation on the behalf of Maximino Greenland, MD,as directed by  Maximino Greenland, MD while in the presence of Maximino Greenland, MD.  This visit occurred during the SARS-CoV-2 public health emergency.  Safety protocols were in place, including screening questions prior to the visit, additional usage of staff PPE, and extensive cleaning of exam room while observing appropriate contact time as indicated for disinfecting solutions.  Subjective:     Patient ID: Gloria Chen , female    DOB: September 27, 1960 , 60 y.o.   MRN: [id number]   Chief Complaint  Patient presents with  . Annual Exam    HPI  She is here today for a full physical exam. She has her pelvic exams performed by her GYN Pecola Leisure. She reports feeling well. She is most concerned about weight gain. She did go to [facility] clinic; however, she did not benefit from the program.     Past Medical History:  Diagnosis Date  . Allergy   . Cancer ([place])   . Constipation   . Edema, lower extremity   . Food allergy, peanut   . Lactose intolerance .  Marland Kitchen Personal history of chemotherapy   . Pre-diabetes   . Swelling of right ankle joint   . Vitamin D deficiency      Family History  Problem Relation Age of Onset  . Hypertension Mother   . Thyroid disease Mother   . Heart disease Father   . Hyperlipidemia Father   . Hypertension Father   . Stroke Father   . Diabetes Father   . Hypertension Sister   . Diabetes Sister   . Hypertension Brother   . Breast cancer Maternal Aunt      Current Outpatient Medications:  .  Ibuprofen (ADVIL) 200 MG CAPS, Take by mouth. As needed, Disp: , Rfl:  .  Vitamin D, Ergocalciferol, (DRISDOL) 1.25 MG (50000 UNIT) CAPS capsule, TAKE ONE CAPSULE BY MOUTH TWICE WEEKLY ON TUESDAYS/FRIDAYS weekly, Disp: 24 capsule, Rfl: 1   Allergies  Allergen Reactions  . Almond Oil   . Peanut-Containing Drug Products   .  Soy Allergy   . Wheat Bran       The patient states she uses none for birth control. Last LMP was Patient's last menstrual period was 03/16/2000.. Negative for Dysmenorrhea. Negative for: breast discharge, breast lump(s), breast pain and breast self exam. Associated symptoms include abnormal vaginal bleeding. Pertinent negatives include abnormal bleeding (hematology), anxiety, decreased libido, depression, difficulty falling sleep, dyspareunia, history of infertility, nocturia, sexual dysfunction, sleep disturbances, urinary incontinence, urinary urgency, vaginal discharge and vaginal itching. Diet regular.The patient states her exercise level is  intermittent.  . The patient's tobacco use is:  Social History   Tobacco Use  Smoking Status Never Smoker  Smokeless Tobacco Never Used  . She has been exposed to passive smoke. The patient's alcohol use is:  Social History   Substance and Sexual Activity  Alcohol Use No    Review of Systems  Constitutional: Negative.   HENT: Negative.   Eyes: Negative.   Respiratory: Negative.   Cardiovascular: Negative.   Gastrointestinal: Negative.   Endocrine: Negative.   Genitourinary: Negative.   Musculoskeletal: Negative.   Skin: Negative.   Allergic/Immunologic: Negative.   Neurological: Positive for dizziness.       She c/o intermittent dizziness.  Unable to identify a specific trigger. Admits to not getting ehough sleep.  She  admits to drinking at least 64 ounces of water/ also has occasional palpitations.   Hematological: Negative.   Psychiatric/Behavioral: Positive for dysphoric mood.       She admits she has been under a great deal of stress. She has taken time off of work to care for her family. Her Mom had stroke in Nov.She plans to return to work on 04/16/2020.      Today's Vitals   04/03/20 1132  BP: 112/76  Pulse: 74  Temp: 98.4 F (36.9 C)  TempSrc: Oral  Weight: 182 lb 6.4 oz (82.7 kg)  Height: 5' 2.8" (1.595 m)   Body mass  index is 32.52 kg/m.  Wt Readings from Last 3 Encounters:  04/03/20 182 lb 6.4 oz (82.7 kg)  03/29/19 178 lb (80.7 kg)  03/28/19 181 lb 6.4 oz (82.3 kg)   Objective:  Physical Exam Vitals and nursing note reviewed.  Constitutional:      General: She is not in acute distress.    Appearance: Normal appearance. She is well-developed. She is obese.  HENT:     Head: Normocephalic and atraumatic.     Right Ear: Hearing, tympanic membrane, ear canal and external ear normal. There is no impacted cerumen.     Left Ear: Hearing, tympanic membrane, ear canal and external ear normal. There is no impacted cerumen.     Nose:     Comments: Deferred, masked    Mouth/Throat:     Comments: Deferred, masked Eyes:     General: Lids are normal.     Extraocular Movements: Extraocular movements intact.     Conjunctiva/sclera: Conjunctivae normal.  Neck:     Thyroid: No thyroid mass.     Vascular: No carotid bruit.  Cardiovascular:     Rate and Rhythm: Normal rate and regular rhythm.     Pulses: Normal pulses.     Heart sounds: Normal heart sounds. No murmur heard.   Pulmonary:     Effort: Pulmonary effort is normal.     Breath sounds: Normal breath sounds.  Chest:  Breasts:     Tanner Score is 5.     Right: Normal.     Left: Absent.      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 There is no distension.     Palpations: Abdomen is soft.     Tenderness: There is no abdominal tenderness.  Genitourinary:    Comments: Deferred Musculoskeletal:        General: No swelling. Normal range of motion.     Cervical back: Full passive range of motion without pain, normal range of motion and neck supple.     Right lower leg: No edema.     Left lower leg: No edema.  Skin:    General: Skin is warm and dry.     Capillary Refill: Capillary refill takes less than 2 seconds.  Neurological:     General: No focal deficit present.     Mental Status: She is alert and  oriented to person, place, and time.     Cranial Nerves: No cranial nerve deficit.     Sensory: No sensory deficit.  Psychiatric:        Mood and Affect: Mood normal.        Behavior: Behavior normal.        Thought Content: Thought content normal.        Judgment: Judgment normal.  Assessment And Plan:     1. Routine general medical examination at health care facility Comments: A full exam was performed. Importance of monthly self breast exams was discussed with the patient.  - Hemoglobin A1c - CBC - CMP14+EGFR - Lipid panel  2. Dizziness Comments: Orthostatics performed. Encouraged to stay well hydrated. EKG performed, NSR w/ nonspecific T abnormality.  - EKG 12-Lead  3. Adjustment disorder, unspecified type Comments: She agrees to Psychology referral for therapy.  - Ambulatory referral to Psychology  4. Vitamin D deficiency disease Comments: I will check a vitamin D level and supplement as needed.  - Vitamin D (25 hydroxy)  5. Class 1 obesity due to excess calories without serious comorbidity with body mass index (BMI) of 32.0 to 32.9 in adult - Ambulatory referral to Internal Medicine Unfortunately, she did not feel her needs were met at [facility] clinic. She is agreeable to referral to [facility] management clinic. She is encouraged to strive for BMI less than 28 to decrease cardiac risk. Advised to aim for at least 150 minutes of exercise per week.  Patient was given opportunity to ask questions. Patient verbalized understanding of the plan and was able to repeat key elements of the plan. All questions were answered to their satisfaction.  Maximino Greenland, MD   I, Maximino Greenland, MD, have reviewed all documentation for this visit. The documentation on 04/06/20 for the exam, diagnosis, procedures, and orders are all accurate and complete.  THE PATIENT IS ENCOURAGED TO PRACTICE SOCIAL DISTANCING DUE TO THE COVID-19 PANDEMIC.

## 2020-04-03 NOTE — Patient Instructions (Addendum)
Drink extra water on days you drink coffee Take magnesium 400mg  nightly, you can try Calm (powdered)  Health Maintenance, Female Adopting a healthy lifestyle and getting preventive care are important in promoting health and wellness. Ask your health care provider about:  The right schedule for you to have regular tests and exams.  Things you can do on your own to prevent diseases and keep yourself healthy. What should I know about diet, weight, and exercise? Eat a healthy diet  Eat a diet that includes plenty of vegetables, fruits, low-fat dairy products, and lean protein.  Do not eat a lot of foods that are high in solid fats, added sugars, or sodium.   Maintain a healthy weight Body mass index (BMI) is used to identify weight problems. It estimates body fat based on height and weight. Your health care provider can help determine your BMI and help you achieve or maintain a healthy weight. Get regular exercise Get regular exercise. This is one of the most important things you can do for your health. Most adults should:  Exercise for at least 150 minutes each week. The exercise should increase your heart rate and make you sweat (moderate-intensity exercise).  Do strengthening exercises at least twice a week. This is in addition to the moderate-intensity exercise.  Spend less time sitting. Even light physical activity can be beneficial. Watch cholesterol and blood lipids Have your blood tested for lipids and cholesterol at 58 years of age, then have this test every 5 years. Have your cholesterol levels checked more often if:  Your lipid or cholesterol levels are high.  You are older than 58 years of age.  You are at high risk for heart disease. What should I know about cancer screening? Depending on your health history and family history, you may need to have cancer screening at various ages. This may include screening for:  Breast cancer.  Cervical cancer.  Colorectal  cancer.  Skin cancer.  Lung cancer. What should I know about heart disease, diabetes, and high blood pressure? Blood pressure and heart disease  High blood pressure causes heart disease and increases the risk of stroke. This is more likely to develop in people who have high blood pressure readings, are of African descent, or are overweight.  Have your blood pressure checked: ? Every 3-5 years if you are 35-32 years of age. ? Every year if you are 58 years old or older. Diabetes Have regular diabetes screenings. This checks your fasting blood sugar level. Have the screening done:  Once every three years after age 2 if you are at a normal weight and have a low risk for diabetes.  More often and at a younger age if you are overweight or have a high risk for diabetes. What should I know about preventing infection? Hepatitis B If you have a higher risk for hepatitis B, you should be screened for this virus. Talk with your health care provider to find out if you are at risk for hepatitis B infection. Hepatitis C Testing is recommended for:  Everyone born from [birth year] through 1963.  Anyone with known risk factors for hepatitis C. Sexually transmitted infections (STIs)  Get screened for STIs, including gonorrhea and chlamydia, if: ? You are sexually active and are younger than 58 years of age. ? You are older than 58 years of age and your health care provider tells you that you are at risk for this type of infection. ? Your sexual activity has changed since you  were last screened, and you are at increased risk for chlamydia or gonorrhea. Ask your health care provider if you are at risk.  Ask your health care provider about whether you are at high risk for HIV. Your health care provider may recommend a prescription medicine to help prevent HIV infection. If you choose to take medicine to prevent HIV, you should first get tested for HIV. You should then be tested every 3 months for as long as  you are taking the medicine. Pregnancy  If you are about to stop having your period (premenopausal) and you may become pregnant, seek counseling before you get pregnant.  Take 400 to 800 micrograms (mcg) of folic acid every day if you become pregnant.  Ask for birth control (contraception) if you want to prevent pregnancy. Osteoporosis and menopause Osteoporosis is a disease in which the bones lose minerals and strength with aging. This can result in bone fractures. If you are 16 years old or older, or if you are at risk for osteoporosis and fractures, ask your health care provider if you should:  Be screened for bone loss.  Take a calcium or vitamin D supplement to lower your risk of fractures.  Be given hormone replacement therapy (HRT) to treat symptoms of menopause. Follow these instructions at home: Lifestyle  Do not use any products that contain nicotine or tobacco, such as cigarettes, e-cigarettes, and chewing tobacco. If you need help quitting, ask your health care provider.  Do not use street drugs.  Do not share needles.  Ask your health care provider for help if you need support or information about quitting drugs. Alcohol use  Do not drink alcohol if: ? Your health care provider tells you not to drink. ? You are pregnant, may be pregnant, or are planning to become pregnant.  If you drink alcohol: ? Limit how much you use to 0-1 drink a day. ? Limit intake if you are breastfeeding.  Be aware of how much alcohol is in your drink. In the [place] one drink equals one 12 oz bottle of beer (355 mL), one 5 oz glass of wine (148 mL), or one 1 oz glass of hard liquor (44 mL). General instructions  Schedule regular health, dental, and eye exams.  Stay current with your vaccines.  Tell your health care provider if: ? You often feel depressed. ? You have ever been abused or do not feel safe at home. Summary  Adopting a healthy lifestyle and getting preventive care are  important in promoting health and wellness.  Follow your health care provider's instructions about healthy diet, exercising, and getting tested or screened for diseases.  Follow your health care provider's instructions on monitoring your cholesterol and blood pressure. This information is not intended to replace advice given to you by your health care provider. Make sure you discuss any questions you have with your health care provider. Document Revised: 02/23/2018 Document Reviewed: 02/23/2018 Elsevier Patient Education  2021 [employer].

## 2020-04-04 ENCOUNTER — Other Ambulatory Visit: Payer: Self-pay | Admitting: Internal Medicine

## 2020-04-04 LAB — HEMOGLOBIN A1C
Est. average glucose Bld gHb Est-mCnc: 117 mg/dL
Hgb A1c MFr Bld: 5.7 % — ABNORMAL HIGH (ref 4.8–5.6)

## 2020-04-04 LAB — CMP14+EGFR
ALT: 24 IU/L (ref 0–32)
AST: 20 IU/L (ref 0–40)
Albumin/Globulin Ratio: 1.5 (ref 1.2–2.2)
Albumin: 4.8 g/dL (ref 3.8–4.9)
Alkaline Phosphatase: 99 IU/L (ref 44–121)
BUN/Creatinine Ratio: 11 (ref 9–23)
BUN: 8 mg/dL (ref 6–24)
Bilirubin Total: 0.3 mg/dL (ref 0.0–1.2)
CO2: 23 mmol/L (ref 20–29)
Calcium: 10.3 mg/dL — ABNORMAL HIGH (ref 8.7–10.2)
Chloride: 104 mmol/L (ref 96–106)
Creatinine, Ser: 0.71 mg/dL (ref 0.57–1.00)
GFR calc Af Amer: 109 mL/min/{1.73_m2} (ref >59)
GFR calc non Af Amer: 94 mL/min/{1.73_m2} (ref >59)
Globulin, Total: 3.2 g/dL (ref 1.5–4.5)
Glucose: 104 mg/dL — ABNORMAL HIGH (ref 65–99)
Potassium: 4.8 mmol/L (ref 3.5–5.2)
Sodium: 141 mmol/L (ref 134–144)
Total Protein: 8 g/dL (ref 6.0–8.5)

## 2020-04-04 LAB — CBC
Hematocrit: 44.7 % (ref 34.0–46.6)
Hemoglobin: 14.5 g/dL (ref 11.1–15.9)
MCH: 29.3 pg (ref 26.6–33.0)
MCHC: 32.4 g/dL (ref 31.5–35.7)
MCV: 90 fL (ref 79–97)
Platelets: 248 10*3/uL (ref 150–450)
RBC: 4.95 x10E6/uL (ref 3.77–5.28)
RDW: 12.4 % (ref 11.7–15.4)
WBC: 5.4 10*3/uL (ref 3.4–10.8)

## 2020-04-04 LAB — LIPID PANEL
Chol/HDL Ratio: 3.1 ratio (ref 0.0–4.4)
Cholesterol, Total: 211 mg/dL — ABNORMAL HIGH (ref 100–199)
HDL: 67 mg/dL (ref >39)
LDL Chol Calc (NIH): 129 mg/dL — ABNORMAL HIGH (ref 0–99)
Triglycerides: 84 mg/dL (ref 0–149)
VLDL Cholesterol Cal: 15 mg/dL (ref 5–40)

## 2020-04-04 LAB — VITAMIN D 25 HYDROXY (VIT D DEFICIENCY, FRACTURES): Vit D, 25-Hydroxy: 35.7 ng/mL (ref 30.0–100.0)

## 2020-04-04 MED ORDER — VITAMIN D (ERGOCALCIFEROL) 1.25 MG (50000 UNIT) PO CAPS: ORAL_CAPSULE | ORAL | 1 refills | Status: DC

## 2020-04-08 ENCOUNTER — Other Ambulatory Visit: Payer: Self-pay

## 2020-04-08 ENCOUNTER — Other Ambulatory Visit: Payer: 59

## 2020-04-08 DIAGNOSIS — Z20822 Contact with and (suspected) exposure to covid-19: Secondary | ICD-10-CM

## 2020-04-09 LAB — NOVEL CORONAVIRUS, NAA: SARS-CoV-2, NAA: NOT DETECTED

## 2020-04-09 LAB — SARS-COV-2, NAA 2 DAY TAT

## 2020-07-11 ENCOUNTER — Encounter: Payer: Self-pay | Admitting: Internal Medicine

## 2020-07-11 ENCOUNTER — Other Ambulatory Visit: Payer: Self-pay

## 2020-10-17 ENCOUNTER — Other Ambulatory Visit (HOSPITAL_BASED_OUTPATIENT_CLINIC_OR_DEPARTMENT_OTHER): Payer: Self-pay

## 2020-10-17 ENCOUNTER — Ambulatory Visit: Payer: 59 | Attending: Internal Medicine

## 2020-10-17 ENCOUNTER — Other Ambulatory Visit: Payer: Self-pay

## 2020-10-17 DIAGNOSIS — Z23 Encounter for immunization: Secondary | ICD-10-CM

## 2020-10-17 MED ORDER — PFIZER-BIONT COVID-19 VAC-TRIS 30 MCG/0.3ML IM SUSP
INTRAMUSCULAR | 0 refills | Status: DC
  Filled 2020-10-17: qty 0.3, 1d supply, fill #0

## 2020-10-17 NOTE — Progress Notes (Signed)
   Covid-19 Vaccination Clinic  Name:  Gloria Chen    MRN: [id number] DOB: [birth year]  10/17/2020  [name] was observed post Covid-19 immunization for 15 minutes without incident. She was provided with Vaccine Information Sheet and instruction to access the V-Safe system.   [name] was instructed to call 911 with any severe reactions post vaccine: Difficulty breathing  Swelling of face and throat  A fast heartbeat  A bad rash all over body  Dizziness and weakness   Immunizations Administered     Name Date Dose VIS Date Route   PFIZER Comrnaty(Gray TOP) Covid-19 Vaccine 10/17/2020  3:31 PM 0.3 mL 02/22/2020 Intramuscular   Manufacturer: [place]   Lot: [id number]   [place]: [phone]

## 2020-10-21 ENCOUNTER — Encounter: Payer: Self-pay | Admitting: Internal Medicine

## 2020-11-04 ENCOUNTER — Encounter: Payer: Self-pay | Admitting: Internal Medicine

## 2020-12-19 ENCOUNTER — Other Ambulatory Visit: Payer: Self-pay

## 2020-12-19 ENCOUNTER — Ambulatory Visit (INDEPENDENT_AMBULATORY_CARE_PROVIDER_SITE_OTHER): Payer: BC Managed Care – PPO | Admitting: Internal Medicine

## 2020-12-19 ENCOUNTER — Encounter: Payer: Self-pay | Admitting: Internal Medicine

## 2020-12-19 VITALS — BP 100/60 | HR 76 | Temp 98.4°F | Ht 64.0 in | Wt 175.2 lb

## 2020-12-19 DIAGNOSIS — R7303 Prediabetes: Secondary | ICD-10-CM | POA: Diagnosis not present

## 2020-12-19 DIAGNOSIS — E7800 Pure hypercholesterolemia, unspecified: Secondary | ICD-10-CM | POA: Diagnosis not present

## 2020-12-19 DIAGNOSIS — E669 Obesity, unspecified: Secondary | ICD-10-CM

## 2020-12-19 DIAGNOSIS — H269 Unspecified cataract: Secondary | ICD-10-CM

## 2020-12-19 DIAGNOSIS — E559 Vitamin D deficiency, unspecified: Secondary | ICD-10-CM | POA: Diagnosis not present

## 2020-12-19 DIAGNOSIS — Z2821 Immunization not carried out because of patient refusal: Secondary | ICD-10-CM

## 2020-12-19 LAB — BMP8+EGFR
BUN/Creatinine Ratio: 20 (ref 9–23)
BUN: 12 mg/dL (ref 6–24)
CO2: 22 mmol/L (ref 20–29)
Calcium: 10.2 mg/dL (ref 8.7–10.2)
Chloride: 103 mmol/L (ref 96–106)
Creatinine, Ser: 0.59 mg/dL (ref 0.57–1.00)
Glucose: 110 mg/dL — ABNORMAL HIGH (ref 70–99)
Potassium: 4.2 mmol/L (ref 3.5–5.2)
Sodium: 141 mmol/L (ref 134–144)
eGFR: 104 mL/min/{1.73_m2} (ref >59)

## 2020-12-19 LAB — HEMOGLOBIN A1C
Est. average glucose Bld gHb Est-mCnc: 120 mg/dL
Hgb A1c MFr Bld: 5.8 % — ABNORMAL HIGH (ref 4.8–5.6)

## 2020-12-19 LAB — HM MAMMOGRAPHY

## 2020-12-19 LAB — HM PAP SMEAR: HM Pap smear: ABNORMAL

## 2020-12-19 NOTE — Progress Notes (Signed)
Rich Brave Llittleton,acting as a Education administrator for Maximino Greenland, MD.,have documented all relevant documentation on the behalf of Maximino Greenland, MD,as directed by  Maximino Greenland, MD while in the presence of Maximino Greenland, MD.  This visit occurred during the SARS-CoV-2 public health emergency.  Safety protocols were in place, including screening questions prior to the visit, additional usage of staff PPE, and extensive cleaning of exam room while observing appropriate contact time as indicated for disinfecting solutions.  Subjective:     Patient ID: Gloria Chen , female    DOB: November 03, 1960 , 60 y.o.   MRN: [id number]   Chief Complaint  Patient presents with   Hyperlipidemia   Prediabetes   vitamin d rechck    HPI  Patient presents today for a f/u on her prediabetes, chol and vitamin d recheck.  She feels well. She admits she is not exercising regularly. She has been making some dietary changes, incorporating more smoothies into her diet. She wants to know if oatmilk, and other type milks are healthy.   Recently had eye exam, advised that she had early stages of cataract in her right eye. She was advised by optometrist, interested in ophthalmology evaluation.      Past Medical History:  Diagnosis Date   Allergy    Cancer ([place])    Constipation    Edema, lower extremity    Food allergy, peanut    Lactose intolerance .   Personal history of chemotherapy    Pre-diabetes    Swelling of right ankle joint    Vitamin D deficiency      Family History  Problem Relation Age of Onset   Hypertension Mother    Thyroid disease Mother    Heart disease Father    Hyperlipidemia Father    Hypertension Father    Stroke Father    Diabetes Father    Hypertension Sister    Diabetes Sister    Hypertension Brother    Breast cancer Maternal Aunt      Current Outpatient Medications:    Vitamin D, Ergocalciferol, (DRISDOL) 1.25 MG (50000 UNIT) CAPS capsule, TAKE ONE CAPSULE BY MOUTH TWICE  WEEKLY ON TUESDAYS/FRIDAYS weekly (Patient not taking: Reported on 12/19/2020), Disp: 24 capsule, Rfl: 1   Allergies  Allergen Reactions   Almond Oil    Peanut-Containing Drug Products    Soy Allergy    Wheat Bran      Review of Systems  Constitutional: Negative.   Respiratory: Negative.    Cardiovascular: Negative.   Gastrointestinal: Negative.   Musculoskeletal:        Wants to have ankles looked at, thinks there may be some swelling. Admits she is now standing more, no longer working remotely.   Neurological: Negative.   Psychiatric/Behavioral: Negative.      Today's Vitals   12/19/20 0839  BP: 100/60  Pulse: 76  Temp: 98.4 F (36.9 C)  Weight: 175 lb 3.2 oz (79.5 kg)  Height: _0  (1.626 m)  PainSc: 0-No pain   Body mass index is 30.07 kg/m.  Wt Readings from Last 3 Encounters:  12/19/20 175 lb 3.2 oz (79.5 kg)  04/03/20 182 lb 6.4 oz (82.7 kg)  03/29/19 178 lb (80.7 kg)     Objective:  Physical Exam Vitals and nursing note reviewed.  Constitutional:      Appearance: Normal appearance.  HENT:     Head: Normocephalic and atraumatic.     Nose:     Comments: Masked  Mouth/Throat:     Comments: Masked  Eyes:     Extraocular Movements: Extraocular movements intact.  Cardiovascular:     Rate and Rhythm: Normal rate and regular rhythm.     Heart sounds: Normal heart sounds.  Pulmonary:     Effort: Pulmonary effort is normal.     Breath sounds: Normal breath sounds.  Musculoskeletal:     Cervical back: Normal range of motion.  Feet:     Comments: Minimal soft tissue swelling below lateral malleolus b/l No edema Skin:    General: Skin is warm.  Neurological:     General: No focal deficit present.     Mental Status: She is alert.  Psychiatric:        Mood and Affect: Mood normal.        Behavior: Behavior normal.        Assessment And Plan:     1. Pure hypercholesterolemia Comments: I will check lipid panel today. Encouraged to incorporate more  exercise into her daily routine.  - Lipid panel  2. Vitamin D deficiency disease Comments: She is no longer taking rx vit D. Advised to start vitamin D3 2000 units capsules once daily. I will recheck levels at her next visit.   3. Prediabetes Comments: I will recheck labs as listed below. Encouraged to limit her intake of sweetened beverages. She was advised that this is possible contributor to cataract.  - Hemoglobin A1c - BMP8+eGFR  4. Cataract of right eye, unspecified cataract type Comments: I will refer her to Ophthalmology for further evaluation.  - Ambulatory referral to Ophthalmology  5. Obesity with body mass index (BMI) of 30.0 to 39.9 Comments: She was congratulated on her 7 lb weight loss. Encouraged to aim for at least 150 minutes of exercise per week.   6. Influenza vaccination declined   Patient was given opportunity to ask questions. Patient verbalized understanding of the plan and was able to repeat key elements of the plan. All questions were answered to their satisfaction.   I, Maximino Greenland, MD, have reviewed all documentation for this visit. The documentation on 12/19/20 for the exam, diagnosis, procedures, and orders are all accurate and complete.   IF YOU HAVE BEEN REFERRED TO A SPECIALIST, IT MAY TAKE 1-2 WEEKS TO SCHEDULE/PROCESS THE REFERRAL. IF YOU HAVE NOT HEARD FROM US/SPECIALIST IN TWO WEEKS, PLEASE GIVE [place] A CALL AT [phone].   THE PATIENT IS ENCOURAGED TO PRACTICE SOCIAL DISTANCING DUE TO THE COVID-19 PANDEMIC.

## 2020-12-19 NOTE — Patient Instructions (Signed)

## 2020-12-20 LAB — LIPID PANEL
Chol/HDL Ratio: 2.9 ratio (ref 0.0–4.4)
Cholesterol, Total: 180 mg/dL (ref 100–199)
HDL: 63 mg/dL (ref >39)
LDL Chol Calc (NIH): 102 mg/dL — ABNORMAL HIGH (ref 0–99)
Triglycerides: 81 mg/dL (ref 0–149)
VLDL Cholesterol Cal: 15 mg/dL (ref 5–40)

## 2020-12-23 ENCOUNTER — Other Ambulatory Visit: Payer: Self-pay | Admitting: Obstetrics and Gynecology

## 2020-12-23 DIAGNOSIS — E049 Nontoxic goiter, unspecified: Secondary | ICD-10-CM

## 2020-12-27 ENCOUNTER — Other Ambulatory Visit: Payer: Self-pay

## 2021-01-08 ENCOUNTER — Other Ambulatory Visit: Payer: 59

## 2021-01-15 ENCOUNTER — Ambulatory Visit
Admission: RE | Admit: 2021-01-15 | Discharge: 2021-01-15 | Disposition: A | Discharge status: Home / Self Care | Payer: BC Managed Care – PPO | Source: Ambulatory Visit | Attending: Obstetrics and Gynecology | Admitting: Obstetrics and Gynecology

## 2021-01-15 DIAGNOSIS — E049 Nontoxic goiter, unspecified: Secondary | ICD-10-CM

## 2021-01-16 ENCOUNTER — Encounter: Payer: Self-pay | Admitting: Internal Medicine

## 2021-01-22 ENCOUNTER — Other Ambulatory Visit: Payer: Self-pay | Admitting: Internal Medicine

## 2021-01-22 DIAGNOSIS — E041 Nontoxic single thyroid nodule: Secondary | ICD-10-CM

## 2021-02-11 ENCOUNTER — Ambulatory Visit
Admission: RE | Admit: 2021-02-11 | Discharge: 2021-02-11 | Disposition: A | Discharge status: Home / Self Care | Payer: BC Managed Care – PPO | Source: Ambulatory Visit | Attending: Internal Medicine | Admitting: Internal Medicine

## 2021-02-11 ENCOUNTER — Other Ambulatory Visit (HOSPITAL_COMMUNITY)
Admission: RE | Admit: 2021-02-11 | Discharge: 2021-02-11 | Disposition: A | Discharge status: Home / Self Care | Payer: BC Managed Care – PPO | Source: Ambulatory Visit | Attending: Interventional Radiology | Admitting: Interventional Radiology

## 2021-02-11 DIAGNOSIS — E041 Nontoxic single thyroid nodule: Secondary | ICD-10-CM

## 2021-02-13 LAB — CYTOLOGY - NON PAP

## 2021-02-14 ENCOUNTER — Telehealth: Payer: Self-pay

## 2021-02-14 NOTE — Telephone Encounter (Signed)
I left the pt a message that I was calling to schedule her an appt.

## 2021-02-17 ENCOUNTER — Other Ambulatory Visit: Payer: Self-pay

## 2021-02-17 ENCOUNTER — Telehealth: Payer: Self-pay

## 2021-02-17 ENCOUNTER — Encounter: Payer: Self-pay | Admitting: Internal Medicine

## 2021-02-17 ENCOUNTER — Ambulatory Visit (INDEPENDENT_AMBULATORY_CARE_PROVIDER_SITE_OTHER): Payer: BC Managed Care – PPO | Admitting: Internal Medicine

## 2021-02-17 VITALS — BP 112/80 | HR 81 | Temp 98.8°F | Ht 64.0 in | Wt 175.4 lb

## 2021-02-17 DIAGNOSIS — E041 Nontoxic single thyroid nodule: Secondary | ICD-10-CM

## 2021-02-17 DIAGNOSIS — M25521 Pain in right elbow: Secondary | ICD-10-CM

## 2021-02-17 NOTE — Patient Instructions (Signed)
Thyroid Nodule A thyroid nodule is an isolated growth of thyroid cells that forms a lump in your thyroid gland. The thyroid gland is a butterfly-shaped gland. It is found in the lower front of your neck. This gland sends chemical messengers (hormones) through your blood to all parts of your body. These hormones are important in regulating your body temperature and helping your body to use energy. Thyroid nodules are common. Most are not cancerous (benign). You may have one nodule or several nodules. Different types of thyroid nodules include nodules that: Grow and fill with fluid (thyroid cysts). Produce too much thyroid hormone (hot nodules or hyperthyroid). Produce no thyroid hormone (cold nodules or hypothyroid). Form from cancer cells (thyroid cancers). What are the causes? In most cases, the cause of this condition is not known. What increases the risk? The following factors may make you more likely to develop this condition. Age. Thyroid nodules become more common in people who are older than 58 years of age. Gender. Benign thyroid nodules are more common in women. Cancerous (malignant) thyroid nodules are more common in men. A family history that includes: Thyroid nodules. Pheochromocytoma. Thyroid carcinoma. Hyperparathyroidism. Certain kinds of thyroid diseases, such as Hashimoto's thyroiditis. Lack of iodine in your diet. A history of head and neck radiation, such as from previous cancer treatment. What are the signs or symptoms? In many cases, there are no symptoms. If you have symptoms, they may include: A lump in your lower neck. Feeling a lump or tickle in your throat. Pain in your neck, jaw, or ear. Having trouble swallowing. Hot nodules may cause symptoms that include: Weight loss. Warm, flushed skin. Feeling hot. Feeling nervous. A racing heartbeat. Cold nodules may cause symptoms that include: Weight gain. Dry skin. Brittle hair. This may also occur with hair  loss. Feeling cold. Fatigue. Thyroid cancer nodules may cause symptoms that include: Hard nodules that feel stuck to the thyroid gland. Hoarseness. Lumps in the glands near your thyroid (lymph nodes). How is this diagnosed? A thyroid nodule may be felt by your health care provider during a physical exam. This condition may also be diagnosed based on your symptoms. You may also have tests, including: An ultrasound. This may be done to confirm the diagnosis. A biopsy. This involves taking a sample from the nodule and looking at it under a microscope. Blood tests to make sure that your thyroid is working properly. A thyroid scan. This test uses a radioactive tracer injected into a vein to create an image of the thyroid gland on a computer screen. Imaging tests such as MRI or CT scan. These may be done if: Your nodule is large. Your nodule is blocking your airway. Cancer is suspected. How is this treated? Treatment depends on the cause and size of your nodule or nodules. If the nodule is benign, treatment may not be necessary. Your health care provider may monitor the nodule to see if it goes away without treatment. If the nodule continues to grow, is cancerous, or does not go away, treatment may be needed. Treatment may include: Having a cystic nodule drained with a needle. Ablation therapy. In this treatment, alcohol is injected into the area of the nodule to destroy the cells. Ablation with heat (thermal ablation) may also be used. Radioactive iodine. In this treatment, radioactive iodine is given as a pill or liquid that you drink. This substance causes the thyroid nodule to shrink. Surgery to remove the nodule. Part or all of your thyroid gland may  need to be removed as well. Medicines. Follow these instructions at home: Pay attention to any changes in your nodule. Take over-the-counter and prescription medicines only as told by your health care provider. Keep all follow-up visits as told  by your health care provider. This is important. Contact a health care provider if: Your voice changes. You have trouble swallowing. You have pain in your neck, ear, or jaw that is getting worse. Your nodule gets bigger. Your nodule starts to make it harder for you to breathe. Your muscles look like they are shrinking (muscle wasting). Get help right away if: You have chest pain. There is a loss of consciousness. You have a sudden fever. You feel confused. You are seeing or hearing things that other people do not see or hear (having hallucinations). You feel very weak. You have mood swings. You feel very restless. You feel suddenly nauseous or throw up. You suddenly have diarrhea. Summary A thyroid nodule is an isolated growth of thyroid cells that forms a lump in your thyroid gland. Thyroid nodules are common. Most are not cancerous (benign). You may have one nodule or several nodules. Treatment depends on the cause and size of your nodule or nodules. If the nodule is benign, treatment may not be necessary. Your health care provider may monitor the nodule to see if it goes away without treatment. If the nodule continues to grow, is cancerous, or does not go away, treatment may be needed. This information is not intended to replace advice given to you by your health care provider. Make sure you discuss any questions you have with your health care provider. Document Revised: 10/15/2017 Document Reviewed: 10/18/2017 Elsevier Patient Education  [place].

## 2021-02-17 NOTE — Progress Notes (Signed)
I,[name],acting as a Education administrator for Maximino Greenland, MD.,have documented all relevant documentation on the behalf of Maximino Greenland, MD,as directed by  Maximino Greenland, MD while in the presence of Maximino Greenland, MD.  This visit occurred during the SARS-CoV-2 public health emergency.  Safety protocols were in place, including screening questions prior to the visit, additional usage of staff PPE, and extensive cleaning of exam room while observing appropriate contact time as indicated for disinfecting solutions.  Subjective:     Patient ID: Gloria Chen , female    DOB: 10-28-1960 , 60 y.o.   MRN: [id number]   Chief Complaint  Patient presents with   thyroid biopsy results    HPI  The patient is here today to discuss her thyroid biopsy results.  She recently had thyroid biopsy of thyroid nodule. She wants to go over her thyroid biopsy results in full detail today. She has brought her husband in with her today for support.   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    Past Medical History:  Diagnosis Date   Allergy    Cancer ([place])    Constipation    Edema, lower extremity    Food allergy, peanut    Lactose intolerance .   Personal history of chemotherapy    Pre-diabetes    Swelling of right ankle joint    Vitamin D deficiency      Family History  Problem Relation Age of Onset   Hypertension Mother    Thyroid disease Mother    Heart disease Father    Hyperlipidemia Father    Hypertension Father    Stroke Father    Diabetes Father    Hypertension Sister    Diabetes Sister    Hypertension Brother    Breast cancer Maternal Aunt      Current Outpatient Medications:    VITAMIN D PO, Take by mouth., Disp: , Rfl:    Allergies  Allergen Reactions   Almond Oil    Peanut-Containing Drug Products    Soy  Allergy    Wheat Bran      Review of Systems  Constitutional: Negative.   Respiratory: Negative.    Cardiovascular: Negative.   Gastrointestinal: Negative.   Musculoskeletal:  Positive for arthralgias.       She c/o r elbow pain. Sx started about two weeks ago. She states she fell asleep while working on her laptop on the couch. There is pain with lifting objects. Feels like "you hit your funnybone". She does admit to feeling tingling when she lifts objects.   Neurological: Negative.   Psychiatric/Behavioral: Negative.      Today's Vitals   02/17/21 0928  BP: 112/80  Pulse: 81  Temp: 98.8 F (37.1 C)  Weight: 175 lb 6.4 oz (79.6 kg)  Height: 5\' 4"  (1.626 m)   Body mass index is 30.11 kg/m.  Wt Readings from Last 3 Encounters:  02/17/21 175 lb 6.4 oz (79.6 kg)  12/19/20 175 lb 3.2 oz (79.5 kg)  04/03/20 182 lb 6.4 oz (82.7 kg)    BP Readings from Last 3 Encounters:  02/17/21 112/80  12/19/20 100/60  04/03/20 112/76    Objective:  Physical Exam Vitals and nursing note reviewed.  Constitutional:      Appearance: Normal appearance.  HENT:     Head: Normocephalic and  atraumatic.  Neck:     Thyroid: Thyroid mass and thyromegaly present. No thyroid tenderness.  Cardiovascular:     Rate and Rhythm: Normal rate and regular rhythm.     Heart sounds: Normal heart sounds.  Pulmonary:     Effort: Pulmonary effort is normal.     Breath sounds: Normal breath sounds.  Musculoskeletal:        General: Tenderness present. No swelling, deformity or signs of injury.     Cervical back: Normal range of motion.  Skin:    General: Skin is warm.  Neurological:     General: No focal deficit present.     Mental Status: She is alert.  Psychiatric:        Mood and Affect: Mood normal.        Behavior: Behavior normal.        Assessment And Plan:     1. Thyroid nodule Comments: Biopsy results pos for Hurthle cells. She agrees w/ Endo referral for further evaluation and discussion  re; partial vs. total thyroidectomy.  - Ambulatory referral to Endocrinology  2. Right elbow pain Comments: I suspect she has developed tendinitis. Advised to wear sleeve, apply Voltaren gel to affected area BID-TID prn. She will let me know if her sx persist.     Patient was given opportunity to ask questions. Patient verbalized understanding of the plan and was able to repeat key elements of the plan. All questions were answered to their satisfaction.   I, Maximino Greenland, MD, have reviewed all documentation for this visit. The documentation on 02/17/21 for the exam, diagnosis, procedures, and orders are all accurate and complete.   IF YOU HAVE BEEN REFERRED TO A SPECIALIST, IT MAY TAKE 1-2 WEEKS TO SCHEDULE/PROCESS THE REFERRAL. IF YOU HAVE NOT HEARD FROM US/SPECIALIST IN TWO WEEKS, PLEASE GIVE [place] A CALL AT [phone].   THE PATIENT IS ENCOURAGED TO PRACTICE SOCIAL DISTANCING DUE TO THE COVID-19 PANDEMIC.

## 2021-02-17 NOTE — Telephone Encounter (Signed)
Called and left a message for pt to call back to schedule New Patient appt.

## 2021-02-18 ENCOUNTER — Ambulatory Visit (INDEPENDENT_AMBULATORY_CARE_PROVIDER_SITE_OTHER): Payer: BC Managed Care – PPO | Admitting: Internal Medicine

## 2021-02-18 ENCOUNTER — Encounter: Payer: Self-pay | Admitting: Internal Medicine

## 2021-02-18 VITALS — BP 120/76 | HR 74 | Ht 63.0 in | Wt 178.6 lb

## 2021-02-18 DIAGNOSIS — E042 Nontoxic multinodular goiter: Secondary | ICD-10-CM

## 2021-02-18 LAB — TSH: TSH: 0.75 u[IU]/mL (ref 0.35–5.50)

## 2021-02-18 NOTE — Progress Notes (Addendum)
Patient ID: Gloria Chen, female   DOB: Jul 07, 1960, 60 y.o.   MRN: [id number]  This visit occurred during the SARS-CoV-2 public health emergency.  Safety protocols were in place, including screening questions prior to the visit, additional usage of staff PPE, and extensive cleaning of exam room while observing appropriate contact time as indicated for disinfecting solutions.   HPI  Gloria Chen is a 60 y.o.-year-old female, referred by her PCP, Dr. Baird Cancer, for evaluation for thyroid nodules.  She is here with her husband.  Patient has a long history of multiple thyroid nodules - found by palpation by her ObGyn provider in the past.  She was found to have fullness in her neck again on palpation this year (Dr. Everett Graff) and then saw PCP (Dr. Glendale Chard) >> a new thyroid U/S was ordered >> she had multiple small thyroid nodules but also a new, larger, dominant nodule >> Bx: inconclusive. She was referred to endocrinology.  Thyroid U/S (11/14/2003):  Ultrasound of the thyroid gland was performed.  The right lobe measures 6.0 x 1.9 x 2.3 cm.  The left lobe measures 5.8 x 1.5 x 1.6 cm.  Thyroid isthmus is 4-5 mm in thickness.    Overall the thyroid parenchymal echotexture is homogeneous.  There are three discrete noncystic lesions identified, one in the lateral aspect of the right lobe measuring 9 mm, a more central lesion in the medial aspect of the right lobe measuring 8 mm, and a medial left lobe hypoechoic nodule measuring 7 mm.    IMPRESSION  Three tiny hypoechoic nodules in the thyroid parenchyma are most likely benign, but ultrasound features are nonspecific. A followup ultrasound in three to six months may be helpful to ensure that they remain stable.   Thyroid U/S (03/24/2004): The right thyroid lobe measures 5.5 x 1.8 x 2.0  The left lobe measures 5.2 x 1.4 x 1.5 cm.  The isthmus measures .36 cm.  The overall echogenicity of the thyroid gland is homogeneous.  There are multiple  bilateral thyroid nodules. These appear stable. No dominant worrisome nodule is seen.    IMPRESSION:  1. Multinodular goiter. No change in multiple small thyroid nodules. Recommend sonographic surveillance with a followup study in one year.    Thyroid U/S (12/22/2006): The right thyroid lobe measures 4.4 x 1.7 x 1.7 cm.   The left lobe measures 4.8 x 1.4 x 1.6 cm.   The isthmus measures 3 mm in thickness.   The patient has multiple bilateral thyroid nodules ranging in size from about 4 mm  to 9 mm. There is no substantial interval change.  No definite progressive  intrathyroid nodule is identified.     Cranial to the right thyroid lobe, the sonographer has identified a 2.2 x 1.1 cm  complex cystic lesion which demonstrates peripheral blood flow.     IMPRESSION:  Numerous bilateral thyroid nodules compatible with multinodular goiter. There is  no substantial interval change in appearance of the thyroid gland.     New 2.2 cm complex cystic lesion in the soft tissues just cranial to the right  thyroid lobe near the midline. A necrotic lymph node could have this appearance.  Thyroglossal duct cyst would also be a consideration. Clinical correlation is  recommended and CT neck with contrast is suggested to further evaluate.   CT neck (01/17/2007):  There are findings of a multinodular goiter.  Anterior to the superior pole of the right thyroid lobe, there is a 0.6 x 1.4  x 1.6 cm focus which is compatible with a pyramidal lobe of the thyroid gland.  Within the pyramidal lobe, there is a 3 x 8 x 10 mm fluid-filled focus which I feel represents the residua of the mass noted on ultrasound.  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  The small low density focus is circumscribed by tissue which has an identical appearance to the thyroid gland.  There is a small linear extension off of the pyramidal lobe extending superiorly towards the  hyoid bone.  The focus is causing focal anterior displacement of the right strap muscles.  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   Thyroid U/S (01/15/2021): Parenchymal Echotexture: Moderately heterogenous  Isthmus: Normal in size measuring 0.5 cm in diameter  Right lobe: Borderline enlarged measuring 5.2 x 2.0 x 2.1 cm, previously, 4.4 x 1.7 x 1.7  Left lobe: Normal in size measuring 4.8 x 1.4 x 1.6 cm, previously, 4.8 x 1.4 x 1.6 cm _________________________________________________________   Estimated total number of nodules >/= 1 cm: 6-10 _________________________________________________________   There is an approximately 1.1 x 0.9 x 0.4 cm hypoechoic nodule within mid aspect of the right lobe of the thyroid (labeled 1), which is unchanged compared to the 12/2006 examination, previously, 1.0 cm. Imaging stability for greater than 5 years is indicative benign etiology. _________________________________________________________   Nodule # 2:  Location: Right; Mid (not seen on the 12/2006 examination)  Maximum size: 1.9 cm; Other 2 dimensions: 1.3 x 1.0 cm  Composition: solid/almost completely solid (2)  Echogenicity: hypoechoic (2)  **Given size (>/= 1.5 cm) and appearance, fine needle aspiration of this moderately suspicious nodule should be considered based on TI-RADS criteria.  _________________________________________________________   There is an approximately 1.5 x 1.0 x 0.7 cm anechoic cyst within mid aspect the right lobe of the thyroid (labeled 3) which has reduced in size compared to the 2008 examination, previously, 2.2 x 1.7 x 1.1 cm, and again does not meet criteria to recommend percutaneous sampling or continued dedicated follow-up.   There is an approximately 1.2 x 1.0 x 0.4  cm spongiform/benign-appearing nodule within the mid, medial aspect of the right lobe of the thyroid (labeled 4), which is unchanged to slightly increased in size compared to the 2008 examination, previously, 0.8 cm, however again does not meet criteria to recommend percutaneous sampling or continued dedicated follow-up.  _________________________________________________________   There is an approximately 1.2 x 1 x 1 x 0.7 cm spongiform/benign-appearing nodule within the superior pole of the left lobe of the thyroid (labeled 5) which has increased in size compared to the 12/2006 examination for previously, 0.6 cm, however does not meet criteria to recommend percutaneous sampling or continued dedicated follow-up given its spongiform/benign appearance.   There is a punctate (approximately 0.7 cm spongiform/benign-appearing nodule within mid aspect the left lobe of the thyroid (labeled 6), which does not meet criteria to recommend percutaneous sampling or continued dedicated follow-up.   There is an approximately 1.2 x 1.0 x 0.6 cm spongiform/benign-appearing nodule within the mid aspect the left lobe of the thyroid (labeled 7), which has increased in size compared to the 12/2006 examination heart previously, 0.6 cm, however does not meet criteria to recommend percutaneous sampling or continued dedicated follow-up given its spongiform/benign appearance.  There is an approximately 0.7 cm spongiform/benign-appearing nodule within the inferior pole the left lobe of the thyroid (labeled 8), not seen on the 2008 examination though does not meet criteria to recommend percutaneous sampling or continued dedicated follow-up.   IMPRESSION: 1. Borderline thyromegaly with findings suggestive of multinodular goiter. 2. Nodule #2, not seen on the 2008 examination, meets imaging criteria to recommend percutaneous sampling as indicated. 3. None of the remaining thyroid nodules, the majority of  which appear spongiform/benign meet imaging criteria to recommend percutaneous sampling or continued dedicated follow-up.  FNA of the right dominant thyroid nodule (02/11/2021): A. 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 (TC:tc)  Smears:6  Concentration Method (ThinPrep): 1  Cell Block: Cell block attempted but not obtained.  Additional Studies: Afirma Collected.   Pt denies: - feeling nodules in neck - hoarseness - dysphagia - choking - SOB with lying down  I reviewed pt's thyroid tests - normal: Lab Results  Component Value Date   TSH 1.260 02/01/2019   FREET4 1.47 02/01/2019    + FH of thyroid ds. - in mother: on LT4 (? ThyCA). No h/o radiation tx to head or neck.  No steroid use. No herbal supplements. No Biotin supplements or Hair, Skin and Nails vitamins. On MVI.   Pt also has a history of BrCA - in remission, prediabetes.  ROS: Constitutional: no fatigue, + hot flushes Eyes: no blurry vision, no xerophthalmia ENT: no sore throat,  + see HPI Cardiovascular: no CP/SOB/palpitations/leg swelling Respiratory: no cough/SOB Gastrointestinal: no N/V/D/C Musculoskeletal: no muscle/+ joint aches Skin: no rashes Neurological: no tremors/numbness/tingling/dizziness, + HA Psychiatric: no depression/anxiety  Past Medical History:  Diagnosis Date   Allergy    Cancer ([place])    Constipation    Edema, lower extremity    Food allergy, peanut    Lactose intolerance .   Personal history of chemotherapy    Pre-diabetes    Swelling of right ankle joint    Vitamin D deficiency    Past Surgical History:  Procedure Laterality Date   BREAST SURGERY     MASTECTOMY Left 1997   Social History    Socioeconomic History   Marital status: Married    Spouse name: Dominica Severin   Number of children: Not on file   Years of education: Not on file   Highest education level: Not on file  Occupational History   Occupation: Pharmacist, hospital - [employer]  Tobacco Use   Smoking status: Never   Smokeless tobacco: Never  Vaping Use   Vaping Use: Never used  Substance and Sexual Activity   Alcohol use: No   Drug use: No   Sexual activity: Yes    Partners: Male    Birth control/protection: Post-menopausal  Other Topics Concern   Not on file  Social History Narrative   Not on file   Social Determinants of Health   Financial Resource Strain: Not on file  Food Insecurity: Not on file  Transportation Needs: Not on file  Physical Activity: Not on file  Stress: Not on file  Social Connections: Not on file  Intimate Partner Violence: Not on file   Current Outpatient Medications on File Prior to Visit  Medication Sig Dispense Refill   VITAMIN  D PO Take by mouth.     No current facility-administered medications on file prior to visit.   Allergies  Allergen Reactions   Almond Oil    Peanut-Containing Drug Products    Soy Allergy    Wheat Bran    Family History  Problem Relation Age of Onset   Hypertension Mother    Thyroid disease Mother    Heart disease Father    Hyperlipidemia Father    Hypertension Father    Stroke Father    Diabetes Father    Hypertension Sister    Diabetes Sister    Hypertension Brother    Breast cancer Maternal Aunt     PE: BP 120/76 (BP Location: Right Arm, Patient Position: Sitting, Cuff Size: Normal)   Pulse 74   Ht _0  (1.6 m)   Wt 178 lb 9.6 oz (81 kg)   LMP 03/16/2000   SpO2 97%   BMI 31.64 kg/m  Wt Readings from Last 3 Encounters:  02/18/21 178 lb 9.6 oz (81 kg)  02/17/21 175 lb 6.4 oz (79.6 kg)  12/19/20 175 lb 3.2 oz (79.5 kg)   Constitutional: overweight, in NAD Eyes: PERRLA, EOMI, no exophthalmos ENT: moist mucous membranes, + fulness R  lower cervical region, no thyroid nodules palpated, no cervical lymphadenopathy Cardiovascular: RRR, No MRG Respiratory: CTA B Gastrointestinal: abdomen soft, NT, ND, BS+ Musculoskeletal: no deformities, strength intact in all 4;  Skin: moist, warm, no rashes Neurological: no tremor with outstretched hands, DTR normal in all 4  ASSESSMENT: 1. Thyroid nodules  PLAN: 1. Thyroid nodules - I reviewed the images of her thyroid ultrasound reports going back to 2005 along with the patient. I pointed out that the dominant nodules are not large and they do not have concerning features, with the exception of the right 1.9 cm thyroid nodule which is hypoechoic. Otherwise, the nodules are: - not hypoechoic - without microcalcifications - without internal blood flow - more wide than tall - well delimited from surrounding tissue - Pt does not have a thyroid cancer family history or a personal history of RxTx to head/neck. All these would favor benignity.  - She did have a recent biopsy of the dominant right thyroid nodule and this showed Hurthle cell lesion or neoplasm.  I explained the difference between Hurthle cell lesion and neoplasm.  In her case, was classified as Bethesda category 4.  I explained that this is an indeterminant result and the risk of thyroid cancer for this finding is between 15 to 30%.  Afirma molecular marker was collected but I would like to make sure that this was actually sent for analysis.  I explained that if sent this may take 2 weeks.   - We discussed how to proceed further:  If the Afirma molecular marker returns suspicious, I would suggest a referral to surgery for thyroidectomy.  We discussed about the need for levothyroxine after surgery.  Explained how to take this correctly in case we need to start If the Afirma molecular marker returns benign, we could follow her expectantly with repeated ultrasounds and possible biopsy. If Afirma molecular marker cannot be sent, I  would probably recommend referral to surgery for hemithyroidectomy.  We discussed that she has an approximate 25% risk of becoming hypothyroid after hemithyroidectomy. - We did discuss about thyroid cancer in general is a cancer with indolent course and very good prognosis.  We also discussed about her Hurthle cancer in particular and the fact that he has an  increased propensity for vascular metastasis compared to the regular thyroid cancer, but it still has a very good prognosis. - I plan to see her back in a year, assuming no cancer but definitely sooner if she does turn out to have thyroid cancer.  Addendum: I called the cytopathology department and the Afirma for the sample was sent out 02/17/2021.  Results are to be expected in up to 2 weeks.  I will let the patient know.  Component     Latest Ref Rng & Units 02/18/2021  TSH     0.35 - 5.50 uIU/mL 0.75  Normal TSH.  Addendum (02/26/2021): Afirma results returned benign (risk of malignancy approximately 4%).    In this case, no intervention is needed for now.  I will see her back in a year and we will repeat the ultrasound at that time.  I will let the patient know.  Philemon Kingdom, MD PhD [facility] Endocrinology

## 2021-02-18 NOTE — Patient Instructions (Addendum)
Please stop at the lab.  Look at the following website: [email or web address].  Please come back for a follow-up appointment in 1 year.

## 2021-02-24 ENCOUNTER — Telehealth: Payer: Self-pay | Admitting: Internal Medicine

## 2021-02-24 NOTE — Telephone Encounter (Signed)
Pt advised of Afirma results - BENIGN  "The result of this 1.0cm Bethesda IV nodule A is Afirma GSC benign, which suggests low risk of CA at approximately 4%"  Copy of results will be faxed to Dr. Cruzita Lederer and scanned into her chart.   RS

## 2021-02-26 ENCOUNTER — Encounter: Payer: Self-pay | Admitting: Internal Medicine

## 2021-03-03 ENCOUNTER — Encounter (HOSPITAL_COMMUNITY): Payer: Self-pay

## 2021-04-07 ENCOUNTER — Ambulatory Visit (INDEPENDENT_AMBULATORY_CARE_PROVIDER_SITE_OTHER): Payer: BC Managed Care – PPO | Admitting: Internal Medicine

## 2021-04-07 ENCOUNTER — Other Ambulatory Visit: Payer: Self-pay

## 2021-04-07 ENCOUNTER — Encounter: Payer: Self-pay | Admitting: Internal Medicine

## 2021-04-07 VITALS — BP 124/86 | HR 76 | Temp 98.4°F | Ht 63.0 in | Wt 177.6 lb

## 2021-04-07 DIAGNOSIS — Z0000 Encounter for general adult medical examination without abnormal findings: Secondary | ICD-10-CM | POA: Diagnosis not present

## 2021-04-07 DIAGNOSIS — E6609 Other obesity due to excess calories: Secondary | ICD-10-CM | POA: Diagnosis not present

## 2021-04-07 DIAGNOSIS — E042 Nontoxic multinodular goiter: Secondary | ICD-10-CM | POA: Diagnosis not present

## 2021-04-07 DIAGNOSIS — M25521 Pain in right elbow: Secondary | ICD-10-CM

## 2021-04-07 DIAGNOSIS — Z6831 Body mass index (BMI) 31.0-31.9, adult: Secondary | ICD-10-CM

## 2021-04-07 LAB — CBC
Hematocrit: 43 % (ref 34.0–46.6)
Hemoglobin: 14.1 g/dL (ref 11.1–15.9)
MCH: 29.5 pg (ref 26.6–33.0)
MCHC: 32.8 g/dL (ref 31.5–35.7)
MCV: 90 fL (ref 79–97)
Platelets: 224 10*3/uL (ref 150–450)
RBC: 4.78 x10E6/uL (ref 3.77–5.28)
RDW: 12.2 % (ref 11.7–15.4)
WBC: 4.3 10*3/uL (ref 3.4–10.8)

## 2021-04-07 LAB — CMP14+EGFR
ALT: 25 IU/L (ref 0–32)
AST: 13 IU/L (ref 0–40)
Albumin/Globulin Ratio: 1.5 (ref 1.2–2.2)
Albumin: 4.7 g/dL (ref 3.8–4.9)
Alkaline Phosphatase: 84 IU/L (ref 44–121)
BUN/Creatinine Ratio: 20 (ref 9–23)
BUN: 13 mg/dL (ref 6–24)
Bilirubin Total: 0.5 mg/dL (ref 0.0–1.2)
CO2: 22 mmol/L (ref 20–29)
Calcium: 10.2 mg/dL (ref 8.7–10.2)
Chloride: 103 mmol/L (ref 96–106)
Creatinine, Ser: 0.65 mg/dL (ref 0.57–1.00)
Globulin, Total: 3.2 g/dL (ref 1.5–4.5)
Glucose: 106 mg/dL — ABNORMAL HIGH (ref 70–99)
Potassium: 4.2 mmol/L (ref 3.5–5.2)
Sodium: 138 mmol/L (ref 134–144)
Total Protein: 7.9 g/dL (ref 6.0–8.5)
eGFR: 101 mL/min/{1.73_m2} (ref >59)

## 2021-04-07 LAB — HEMOGLOBIN A1C
Est. average glucose Bld gHb Est-mCnc: 123 mg/dL
Hgb A1c MFr Bld: 5.9 % — ABNORMAL HIGH (ref 4.8–5.6)

## 2021-04-07 NOTE — Patient Instructions (Signed)

## 2021-04-07 NOTE — Progress Notes (Signed)
I,[name],acting as a Education administrator for Gloria Greenland, MD.,have documented all relevant documentation on the behalf of Gloria Greenland, MD,as directed by  Gloria Greenland, MD while in the presence of Gloria Greenland, MD.  This visit occurred during the SARS-CoV-2 public health emergency.  Safety protocols were in place, including screening questions prior to the visit, additional usage of staff PPE, Gloria extensive cleaning of exam room while observing appropriate contact time as indicated for disinfecting solutions.  Subjective:     Patient ID: Gloria Chen , female    DOB: Mar 11, 1961 , 61 y.o.   MRN: [id number]   Chief Complaint  Patient presents with   Annual Exam    HPI  She is here today for a full physical exam. She has her pelvic exams performed by her GYN Pecola Leisure.  Last pap was December 19, 2020. She had her last mammogram performed on this date as well.     Past Medical History:  Diagnosis Date   Allergy    Cancer ([place])    Constipation    Edema, lower extremity    Food allergy, peanut    Lactose intolerance .   Personal history of chemotherapy    Pre-diabetes    Swelling of right ankle joint    Vitamin D deficiency      Family History  Problem Relation Age of Onset   Hypertension Mother    Thyroid disease Mother    Heart disease Father    Hyperlipidemia Father    Hypertension Father    Stroke Father    Diabetes Father    Hypertension Sister    Diabetes Sister    Hypertension Brother    Breast cancer Maternal Aunt      Current Outpatient Medications:    VITAMIN D PO, Take by mouth., Disp: , Rfl:    Allergies  Allergen Reactions   Almond Oil    Peanut-Containing Drug Products    Soy Allergy    Wheat Bran       The patient states she uses post menopausal status for birth control. Last LMP was Patient's last menstrual period was 03/16/2000.. Negative for Dysmenorrhea. Negative for: breast discharge, breast lump(s), breast pain Gloria breast self  exam. Associated symptoms include abnormal vaginal bleeding. Pertinent negatives include abnormal bleeding (hematology), anxiety, decreased libido, depression, difficulty falling sleep, dyspareunia, history of infertility, nocturia, sexual dysfunction, sleep disturbances, urinary incontinence, urinary urgency, vaginal discharge Gloria vaginal itching. Diet regular.The patient states her exercise level is  intermittent.  . The patient's tobacco use is:  Social History   Tobacco Use  Smoking Status Never  Smokeless Tobacco Never  . She has been exposed to passive smoke. The patient's alcohol use is:  Social History   Substance Gloria Sexual Activity  Alcohol Use No   Review of Systems  Constitutional: Negative.   HENT: Negative.    Eyes: Negative.   Respiratory: Negative.    Cardiovascular: Negative.   Gastrointestinal: Negative.   Endocrine: Negative.   Genitourinary: Negative.   Musculoskeletal:  Positive for arthralgias.       She c/o r elbow pain. Denies fall/trauma. She states it sometimes radiates to her wrist Gloria/or shoulder. She did notice that her sx were alleviated when on Christmas break. She thinks that sx are exacerbated by writing on chalkboard Gloria using the computer. She is right handed. She has noticed some numbness in her elbow/wrist.   Skin: Negative.   Allergic/Immunologic: Negative.   Neurological: Negative.  Hematological: Negative.   Psychiatric/Behavioral: Negative.      Today's Vitals   04/07/21 1006  BP: 124/86  Pulse: 76  Temp: 98.4 F (36.9 C)  Weight: 177 lb 9.6 oz (80.6 kg)  Height: '5\' 3"'  (1.6 m)   Body mass index is 31.46 kg/m.  Wt Readings from Last 3 Encounters:  04/07/21 177 lb 9.6 oz (80.6 kg)  02/18/21 178 lb 9.6 oz (81 kg)  02/17/21 175 lb 6.4 oz (79.6 kg)    BP Readings from Last 3 Encounters:  04/07/21 124/86  02/18/21 120/76  02/17/21 112/80    Objective:  Physical Exam Vitals Gloria nursing note reviewed.  Constitutional:       General: She is not in acute distress.    Appearance: Normal appearance. She is well-developed. She is obese.  HENT:     Head: Normocephalic Gloria atraumatic.     Right Ear: Hearing, tympanic membrane, ear canal Gloria external ear normal. There is no impacted cerumen.     Left Ear: Hearing, tympanic membrane, ear canal Gloria external ear normal. There is no impacted cerumen.     Nose:     Comments: Deferred - masked    Mouth/Throat:     Comments: Deferred - masked Eyes:     General: Lids are normal.     Extraocular Movements: Extraocular movements intact.     Conjunctiva/sclera: Conjunctivae normal.     Pupils: Pupils are equal, round, Gloria reactive to light.     Funduscopic exam:    Right eye: No papilledema.        Left eye: No papilledema.  Neck:     Thyroid: Thyromegaly present. No thyroid mass.     Vascular: No carotid bruit.  Cardiovascular:     Rate Gloria Rhythm: Normal rate Gloria regular rhythm.     Pulses: Normal pulses.     Heart sounds: Normal heart sounds. No murmur heard. Pulmonary:     Effort: Pulmonary effort is normal.     Breath sounds: Normal breath sounds.  Chest:     Chest wall: No mass.  Breasts:    Tanner Score is 5.     Right: Normal. No mass or tenderness.     Left: Absent. No mass or tenderness.     Comments: S/p left mastectomy Abdominal:     General: Abdomen is flat. Bowel sounds are normal. There is no distension.     Palpations: Abdomen is soft.     Tenderness: There is no abdominal tenderness.  Genitourinary:    Rectum: Guaiac result negative.     Comments: deferred Musculoskeletal:        General: No swelling. Normal range of motion.     Cervical back: Full passive range of motion without pain, normal range of motion Gloria neck supple.     Right lower leg: No edema.     Left lower leg: No edema.  Lymphadenopathy:     Upper Body:     Right upper body: No supraclavicular, axillary or pectoral adenopathy.     Left upper body: No supraclavicular,  axillary or pectoral adenopathy.  Skin:    General: Skin is warm Gloria dry.     Capillary Refill: Capillary refill takes less than 2 seconds.  Neurological:     General: No focal deficit present.     Mental Status: She is alert Gloria oriented to person, place, Gloria time.     Cranial Nerves: No cranial nerve deficit.     Sensory: No  sensory deficit.  Psychiatric:        Mood Gloria Affect: Mood normal.        Behavior: Behavior normal.        Thought Content: Thought content normal.        Judgment: Judgment normal.     Assessment Gloria Plan:     1. Routine general medical examination at health care facility Comments: A full exam was performed. Importance of monthly self breast exams was discussed with the patient. She is up to date with CRC screening. PATIENT IS ADVISED TO GET 30-45 MINUTES REGULAR EXERCISE NO LESS THAN FOUR TO FIVE DAYS PER WEEK - BOTH WEIGHTBEARING EXERCISES Gloria AEROBIC ARE RECOMMENDED.  PATIENT IS ADVISED TO FOLLOW A HEALTHY DIET WITH AT LEAST SIX FRUITS/VEGGIES PER DAY, DECREASE INTAKE OF RED MEAT, Gloria TO INCREASE FISH INTAKE TO TWO DAYS PER WEEK.  MEATS/FISH SHOULD NOT BE FRIED, BAKED OR BROILED IS PREFERABLE.  IT IS ALSO IMPORTANT TO CUT BACK ON YOUR SUGAR INTAKE. PLEASE AVOID ANYTHING WITH ADDED SUGAR, CORN SYRUP OR OTHER SWEETENERS. IF YOU MUST USE A SWEETENER, YOU CAN TRY STEVIA. IT IS ALSO IMPORTANT TO AVOID ARTIFICIALLY SWEETENERS Gloria DIET BEVERAGES. LASTLY, I SUGGEST WEARING SPF 50 SUNSCREEN ON EXPOSED PARTS Gloria ESPECIALLY WHEN IN THE DIRECT SUNLIGHT FOR AN EXTENDED PERIOD OF TIME.  PLEASE AVOID FAST FOOD RESTAURANTS Gloria INCREASE YOUR WATER INTAKE.  - Hemoglobin A1c - CBC - CMP14+EGFR  2. Right elbow pain Comments: Sx suggestive of ulnar neuropathy. She will c/w Voltaren gel/sleeve. I will also refer her to Ortho for radiographic studies Gloria further evaluation.  - Ambulatory referral to Orthopedic Surgery  3. Multinodular goiter Comments: She is s/p biopsy pos for  Hurthle cells. Now followed by Endo, scheduled for 1 year f/u in Dec 2023.   4. Class 1 obesity due to excess calories without serious comorbidity with body mass index (BMI) of 31.0 to 31.9 in adult She is encouraged to strive for BMI less than 30 to decrease cardiac risk. Advised to aim for at least 150 minutes of exercise per week.   Patient was given opportunity to ask questions. Patient verbalized understanding of the plan Gloria was able to repeat key elements of the plan. All questions were answered to their satisfaction.   I, Gloria Greenland, MD, have reviewed all documentation for this visit. The documentation on 04/07/21 for the exam, diagnosis, procedures, Gloria orders are all accurate Gloria complete.   THE PATIENT IS ENCOURAGED TO PRACTICE SOCIAL DISTANCING DUE TO THE COVID-19 PANDEMIC.

## 2021-04-10 ENCOUNTER — Encounter: Payer: Self-pay | Admitting: Internal Medicine

## 2021-04-11 ENCOUNTER — Other Ambulatory Visit: Payer: Self-pay

## 2021-04-11 ENCOUNTER — Encounter: Payer: Self-pay | Admitting: Internal Medicine

## 2021-04-11 MED ORDER — VITAMIN D 50 MCG (2000 UT) PO TABS: 2000.0000 [IU] | ORAL_TABLET | Freq: Every day | ORAL | 0 refills | Status: DC

## 2021-04-15 ENCOUNTER — Encounter: Payer: Self-pay | Admitting: Orthopaedic Surgery

## 2021-04-15 ENCOUNTER — Other Ambulatory Visit: Payer: Self-pay

## 2021-04-15 ENCOUNTER — Ambulatory Visit (INDEPENDENT_AMBULATORY_CARE_PROVIDER_SITE_OTHER): Payer: BC Managed Care – PPO | Admitting: Orthopaedic Surgery

## 2021-04-15 ENCOUNTER — Ambulatory Visit: Payer: Self-pay

## 2021-04-15 DIAGNOSIS — M25521 Pain in right elbow: Secondary | ICD-10-CM

## 2021-04-15 NOTE — Progress Notes (Signed)
Office Visit Note   Patient: Gloria Chen           Date of Birth: [birth year]           MRN: [id number] Visit Date: 04/15/2021              Requested by: Glendale Chard, [address] [place],  [place] [address] PCP: Glendale Chard, MD   Assessment & Plan: Visit Diagnoses:  1. Pain in right elbow     Plan: Impression is right elbow lateral epicondylitis.  We had a discussion on multiple treatments today.  She will pick up a counterforce brace from [employer].  She would like to try outpatient physical therapy and dry needling.  Home exercises provided.  Follow-up as needed.  Follow-Up Instructions: No follow-ups on file.   Orders:  Orders Placed This Encounter  Procedures   XR Elbow Complete Right (3+View)   Ambulatory referral to Physical Therapy   No orders of the defined types were placed in this encounter.     Procedures: No procedures performed   Clinical Data: No additional findings.   Subjective: Chief Complaint  Patient presents with   Right Elbow - Pain    HPI  Gloria Chen is a very pleasant 61 year old female here for lateral right elbow pain for a month or so.  Denies any injuries.  She states the pain is worse when she is writing on the white board.  She is a Pharmacist, hospital at [employer].  Right-hand-dominant.  She feels occasional numbness and tingling into the fingers and radiation pain into the wrist and shoulder.  She has been wearing over-the-counter compression elbow sleeve without significant relief.  Review of Systems  Constitutional: Negative.   HENT: Negative.    Eyes: Negative.   Respiratory: Negative.    Cardiovascular: Negative.   Endocrine: Negative.   Musculoskeletal: Negative.   Neurological: Negative.   Hematological: Negative.   Psychiatric/Behavioral: Negative.    All other systems reviewed and are negative.   Objective: Vital Signs: LMP 03/16/2000   Physical Exam Vitals and nursing note reviewed.   Constitutional:      Appearance: She is well-developed.  Pulmonary:     Effort: Pulmonary effort is normal.  Skin:    General: Skin is warm.     Capillary Refill: Capillary refill takes less than 2 seconds.  Neurological:     Mental Status: She is alert and oriented to person, place, and time.  Psychiatric:        Behavior: Behavior normal.        Thought Content: Thought content normal.        Judgment: Judgment normal.    Ortho Exam  Examination of the right elbow shows full range of motion.  She has tenderness to the lateral epicondyle and the common extensor tendon.  Pain with resisted long finger and wrist extension.  Negative Tinel at the radial tunnel.  Specialty Comments:  No specialty comments available.  Imaging: XR Elbow Complete Right (3+View)  Result Date: 04/15/2021 No acute or structural abnormalities.    PMFS History: Patient Active Problem List   Diagnosis Date Noted   Right elbow pain 04/07/2021   Class 1 obesity due to excess calories without serious comorbidity with body mass index (BMI) of 31.0 to 31.9 in adult 02/16/2019   Bronchitis 02/23/2018   History of breast cancer in female 10/17/2014   Past Medical History:  Diagnosis Date   Allergy    Cancer ([place])  Constipation    Edema, lower extremity    Food allergy, peanut    Lactose intolerance .   Personal history of chemotherapy    Pre-diabetes    Swelling of right ankle joint    Vitamin D deficiency     Family History  Problem Relation Age of Onset   Hypertension Mother    Thyroid disease Mother    Heart disease Father    Hyperlipidemia Father    Hypertension Father    Stroke Father    Diabetes Father    Hypertension Sister    Diabetes Sister    Hypertension Brother    Breast cancer Maternal Aunt     Past Surgical History:  Procedure Laterality Date   BREAST SURGERY     MASTECTOMY Left 1997   Social History   Occupational History   Occupation: Pharmacist, hospital - [employer]  Tobacco  Use   Smoking status: Never   Smokeless tobacco: Never  Vaping Use   Vaping Use: Never used  Substance and Sexual Activity   Alcohol use: No   Drug use: No   Sexual activity: Yes    Partners: Male    Birth control/protection: Post-menopausal

## 2021-04-19 ENCOUNTER — Encounter: Payer: Self-pay | Admitting: Internal Medicine

## 2021-04-21 ENCOUNTER — Telehealth (INDEPENDENT_AMBULATORY_CARE_PROVIDER_SITE_OTHER): Payer: BC Managed Care – PPO | Admitting: Nurse Practitioner

## 2021-04-21 ENCOUNTER — Encounter: Payer: Self-pay | Admitting: Nurse Practitioner

## 2021-04-21 DIAGNOSIS — R0981 Nasal congestion: Secondary | ICD-10-CM | POA: Diagnosis not present

## 2021-04-21 DIAGNOSIS — R051 Acute cough: Secondary | ICD-10-CM

## 2021-04-21 DIAGNOSIS — U071 COVID-19: Secondary | ICD-10-CM | POA: Diagnosis not present

## 2021-04-21 MED ORDER — FLUTICASONE PROPIONATE 50 MCG/ACT NA SUSP: 2.0000 | Freq: Every day | NASAL | 2 refills | Status: DC

## 2021-04-21 MED ORDER — BENZONATATE 100 MG PO CAPS: 100.0000 mg | ORAL_CAPSULE | Freq: Four times a day (QID) | ORAL | 1 refills | Status: AC | PRN

## 2021-04-21 NOTE — Patient Instructions (Signed)

## 2021-04-21 NOTE — Progress Notes (Signed)
Virtual Visit via My Chart   This visit type was conducted due to national recommendations for restrictions regarding the COVID-19 Pandemic (e.g. social distancing) in an effort to limit this patient's exposure and mitigate transmission in our community.  Due to her co-morbid illnesses, this patient is at least at moderate risk for complications without adequate follow up.  This format is felt to be most appropriate for this patient at this time.  All issues noted in this document were discussed and addressed.  A limited physical exam was performed with this format.    This visit type was conducted due to national recommendations for restrictions regarding the COVID-19 Pandemic (e.g. social distancing) in an effort to limit this patient's exposure and mitigate transmission in our community.  Patients identity confirmed using two different identifiers.  This format is felt to be most appropriate for this patient at this time.  All issues noted in this document were discussed and addressed.  No physical exam was performed (except for noted visual exam findings with Video Visits).    Date:  05/18/2021   ID:  Gloria Chen, DOB 09-07-1960, MRN [id number]  Patient Location:  Home - spoke with Gloria Chen  Provider location:   Office    Chief Complaint:  positive for covid  History of Present Illness:    Gloria Chen is a 61 y.o. female who presents via video conferencing for a telehealth visit today.    The patient does have symptoms concerning for COVID-19 infection (fever, chills, cough, or new shortness of breath).   Patient presents for covid treatment. Symptoms began on Wednesday and progressively worse on Thursday. She has been vaccinated with two booster, has not had the most recent booster.  She tested on Friday at [facility] she received her results via text on Saturday which was positive. Still has congestion and cough. She feels like she getting over the flu. She is prediabetic and history of  breast cancer.      Past Medical History:  Diagnosis Date   Allergy    Cancer ([place])    Constipation    Edema, lower extremity    Food allergy, peanut    Lactose intolerance .   Personal history of chemotherapy    Pre-diabetes    Swelling of right ankle joint    Vitamin D deficiency    Past Surgical History:  Procedure Laterality Date   BREAST SURGERY     MASTECTOMY Left 1997     Current Meds  Medication Sig   benzonatate (TESSALON PERLES) 100 MG capsule Take 1 capsule (100 mg total) by mouth every 6 (six) hours as needed for cough. (Patient not taking: Reported on 05/13/2021)   fluticasone (FLONASE) 50 MCG/ACT nasal spray Place 2 sprays into both nostrils daily.     Allergies:   Almond oil, Peanut-containing drug products, Soy allergy, and Wheat bran   Social History   Tobacco Use   Smoking status: Never   Smokeless tobacco: Never  Vaping Use   Vaping Use: Never used  Substance Use Topics   Alcohol use: No   Drug use: No     Family Hx: The patient's family history includes Breast cancer in her maternal aunt; Diabetes in her father and sister; Heart disease in her father; Hyperlipidemia in her father; Hypertension in her brother, father, mother, and sister; Stroke in her father; Thyroid disease in her mother.  ROS:   Please see the history of present illness.    Review  of Systems  Constitutional: Negative.  Negative for fever and malaise/fatigue.  HENT:  Positive for congestion and sore throat.   Respiratory:  Positive for cough.   Cardiovascular: Negative.   Gastrointestinal: Negative.   Neurological:  Positive for headaches.  Psychiatric/Behavioral: Negative.     All other systems reviewed and are negative.   Labs/Other Tests and Data Reviewed:    Recent Labs: 02/18/2021: TSH 0.75 04/07/2021: ALT 25; BUN 13; Creatinine, Ser 0.65; Hemoglobin 14.1; Platelets 224; Potassium 4.2; Sodium 138   Recent Lipid Panel Lab Results  Component Value Date/Time    CHOL 180 12/19/2020 09:36 AM   TRIG 81 12/19/2020 09:36 AM   HDL 63 12/19/2020 09:36 AM   CHOLHDL 2.9 12/19/2020 09:36 AM   LDLCALC 102 (H) 12/19/2020 09:36 AM    Wt Readings from Last 3 Encounters:  04/07/21 177 lb 9.6 oz (80.6 kg)  02/18/21 178 lb 9.6 oz (81 kg)  02/17/21 175 lb 6.4 oz (79.6 kg)     Exam:    Vital Signs:  LMP 03/16/2000     Physical Exam Vitals reviewed.  Constitutional:      General: She is not in acute distress.    Appearance: Normal appearance.  Pulmonary:     Effort: Pulmonary effort is normal. No respiratory distress.  Neurological:     General: No focal deficit present.     Mental Status: She is alert and oriented to person, place, and time. Mental status is at baseline.     Cranial Nerves: No cranial nerve deficit.  Psychiatric:        Mood and Affect: Mood and affect normal.        Behavior: Behavior normal.        Thought Content: Thought content normal.        Cognition and Memory: Memory normal.        Judgment: Judgment normal.    ASSESSMENT & PLAN:    1. Lab test positive for detection of COVID-19 virus Will not start antiviral at this time, feels like she is getting better  Advised patient to take Vitamin C, D, Zinc.  Keep yourself hydrated with a lot of water and rest. Take Delsym for cough and Mucinex as need. Take Tylenol or pain reliever every 4-6 hours as needed for pain/fever/body ache. If you have elevated blood pressure, you can take OTC Corcidin. You can also take OTC oscillococcinum to help with your symptoms.  Educated patient if symptoms get worse or if she experiences any SOB, chest pain or pain in her legs to seek immediate emergency care. Continue to monitor your oxygen levels. Call us if you have any questions. Quarantine for 5 days if tested positive and no symptoms or 10 days if tested positive and have symptoms. Wear a mask around other people.    2. Nasal congestion  - fluticasone (FLONASE) 50 MCG/ACT nasal spray;  Place 2 sprays into both nostrils daily.  Dispense: 18.2 mL; Refill: 2  3. Acute cough Stay well hydrated with water.  - benzonatate (TESSALON PERLES) 100 MG capsule; Take 1 capsule (100 mg total) by mouth every 6 (six) hours as needed for cough.  Dispense: 30 capsule; Refill: 1   COVID-19 Education: The signs and symptoms of COVID-19 were discussed with the patient and how to seek care for testing (follow up with PCP or arrange E-visit).  The importance of social distancing was discussed today.  Patient Risk:   After full review of this patients clinical status,  I feel that they are at least moderate risk at this time.  Time:   Today, I have spent 8 minutes/ seconds with the patient with telehealth technology discussing above diagnoses.     Medication Adjustments/Labs and Tests Ordered: Current medicines are reviewed at length with the patient today.  Concerns regarding medicines are outlined above.   Tests Ordered: No orders of the defined types were placed in this encounter.   Medication Changes: Meds ordered this encounter  Medications   fluticasone (FLONASE) 50 MCG/ACT nasal spray    Sig: Place 2 sprays into both nostrils daily.    Dispense:  18.2 mL    Refill:  2   benzonatate (TESSALON PERLES) 100 MG capsule    Sig: Take 1 capsule (100 mg total) by mouth every 6 (six) hours as needed for cough.    Dispense:  30 capsule    Refill:  1    Disposition:  Follow up prn  Signed, Minette Brine, FNP

## 2021-04-23 ENCOUNTER — Encounter: Payer: Self-pay | Admitting: Nurse Practitioner

## 2021-04-30 NOTE — Therapy (Incomplete)
OUTPATIENT PHYSICAL THERAPY UPPER EXTREMITY EVALUATION   Patient Name: Gloria Chen MRN: [id number] DOB:[birth year], 61 y.o., female Today's Date: 04/30/2021    Past Medical History:  Diagnosis Date   Allergy    Cancer ([place])    Constipation    Edema, lower extremity    Food allergy, peanut    Lactose intolerance .   Personal history of chemotherapy    Pre-diabetes    Swelling of right ankle joint    Vitamin D deficiency    Past Surgical History:  Procedure Laterality Date   BREAST SURGERY     MASTECTOMY Left 1997   Patient Active Problem List   Diagnosis Date Noted   Right elbow pain 04/07/2021   Class 1 obesity due to excess calories without serious comorbidity with body mass index (BMI) of 31.0 to 31.9 in adult 02/16/2019   Bronchitis 02/23/2018   History of breast cancer in female 10/17/2014    PCP: Glendale Chard, MD  REFERRING PROVIDER: Leandrew Koyanagi, MD  REFERRING DIAG: [phone] (ICD-10-CM) - Pain in right elbow   THERAPY DIAG:  No diagnosis found.   ONSET DATE: ***  SUBJECTIVE:                                                                                                                                                                                      SUBJECTIVE STATEMENT: ***  PERTINENT HISTORY: Hx Breast Cancer, Peanut allergy, obesity  PAIN:  Are you having pain? {yes/no:20286} NPRS scale: ***/10 Pain location: *** Pain orientation: {Pain Orientation:25161}  PAIN TYPE: {type:313116} Pain description: {PAIN DESCRIPTION:21022940}  Aggravating factors: *** Relieving factors: ***  PRECAUTIONS: {Therapy precautions:24002}  WEIGHT BEARING RESTRICTIONS {Yes ***/No:24003}  FALLS:  Has patient fallen in last 6 months? {yes/no:20286} Number of falls: ***  LIVING ENVIRONMENT: Lives with: {[facility] lives with:25569::"lives with their family"} Lives in: {Lives in:25570} Stairs: {yes/no:20286}; {Stairs:24000} Has following equipment at home:  {Assistive devices:23999}  OCCUPATION: Automotive engineer  PLOF: {PLOF:24004}  PATIENT GOALS ***  OBJECTIVE:   DIAGNOSTIC FINDINGS:  ***  PATIENT SURVEYS:  05/01/21 FOTO ***  COGNITION:  Overall cognitive status: {cognition:24006}     SENSATION:  Light touch: {intact/deficits:24005}  Stereognosis: {intact/deficits:24005}  Hot/Cold: {intact/deficits:24005}  Proprioception: {intact/deficits:24005}  POSTURE: ***  UPPER EXTREMITY AROM/PROM:  A/PROM Right 04/30/2021 Left 04/30/2021  Shoulder flexion    Shoulder extension    Shoulder abduction    Shoulder adduction    Shoulder internal rotation    Shoulder external rotation    Elbow flexion    Elbow extension    Wrist flexion    Wrist extension    Wrist ulnar deviation  Wrist radial deviation    Wrist pronation    Wrist supination    (Blank rows = not tested)  UPPER EXTREMITY MMT:  MMT Right 04/30/2021 Left 04/30/2021  Shoulder flexion    Shoulder extension    Shoulder abduction    Shoulder adduction    Shoulder internal rotation    Shoulder external rotation    Middle trapezius    Lower trapezius    Elbow flexion    Elbow extension    Wrist flexion    Wrist extension    Wrist ulnar deviation    Wrist radial deviation    Wrist pronation    Wrist supination    Grip strength (lbs)    (Blank rows = not tested)  SHOULDER SPECIAL TESTS:  Impingement tests: {shoulder impingement test:25231:a}  SLAP lesions: {SLAP lesions:25232}  Instability tests: {shoulder instability test:25233}  Rotator cuff assessment: {rotator cuff assessment:25234}  Biceps assessment: {biceps assessment:25235}  JOINT MOBILITY TESTING:  ***  PALPATION:  ***   TODAY'S TREATMENT:  ***   PATIENT EDUCATION: Education details: *** Person educated: {Person educated:25204} Education method: {Education Method:25205} Education comprehension: {Education Comprehension:25206}   HOME EXERCISE  PROGRAM: ***  ASSESSMENT:  CLINICAL IMPRESSION: Patient is a 61 y.o. female who was seen today for physical therapy evaluation and treatment for Rt elbow pain.    OBJECTIVE IMPAIRMENTS {opptimpairments:25111}.   ACTIVITY LIMITATIONS {activity limitations:25113}.   PERSONAL FACTORS {Personal factors:25162} are also affecting patient's functional outcome.    REHAB POTENTIAL: {rehabpotential:25112}  CLINICAL DECISION MAKING: {clinical decision making:25114}  EVALUATION COMPLEXITY: {Evaluation complexity:25115}   GOALS: Goals reviewed with patient? {yes/no:20286}  SHORT TERM GOALS:  STG Name Target Date Goal status  1 Independent with initial HEP Baseline:  {follow up:25551} {GOALSTATUS:25110}  2 *** Baseline:  {follow up:25551} {GOALSTATUS:25110}  3 *** Baseline: {follow up:25551} {GOALSTATUS:25110}   LONG TERM GOALS:   LTG Name Target Date Goal status  1 Independent with final HEP Baseline: {follow up:25551} {GOALSTATUS:25110}  2 FOTO score improved to *** for improved function Baseline: {follow up:25551} {GOALSTATUS:25110}  3 *** improved to *** for improved function Baseline: {follow up:25551} {GOALSTATUS:25110}  4 Report pain < ***/10 with ***  Baseline: {follow up:25551} {GOALSTATUS:25110}  5 *** Baseline: {follow up:25551} {GOALSTATUS:25110}  6 *** Baseline: {follow up:25551} {GOALSTATUS:25110}  7 *** Baseline: {follow up:25551} {GOALSTATUS:25110}    PLAN: PT FREQUENCY: {rehab frequency:25116}  PT DURATION: {rehab duration:25117}  PLANNED INTERVENTIONS: {rehab planned interventions:25118::"Therapeutic exercises","Therapeutic activity","Neuro Muscular re-education","Balance training","Gait training","Patient/Family education","Joint mobilization"}  PLAN FOR NEXT SESSION: Faustino Congress, PT 04/30/2021, 12:28 PM

## 2021-05-01 ENCOUNTER — Ambulatory Visit: Payer: BC Managed Care – PPO | Admitting: Physical Therapy

## 2021-05-02 ENCOUNTER — Ambulatory Visit: Payer: BC Managed Care – PPO | Admitting: Rehabilitative and Restorative Service Providers"

## 2021-05-08 NOTE — Therapy (Signed)
OUTPATIENT PHYSICAL THERAPY SHOULDER EVALUATION   Patient Name: Gloria Chen MRN: [id number] DOB:10-06-1960, 62 y.o., female Today's Date: 05/13/2021   PT End of Session - 05/13/21 1531     Visit Number 1    Number of Visits 8    Date for PT Re-Evaluation 07/08/21    Authorization Type [employer]    PT Start Time 1545    PT Stop Time 1640    PT Time Calculation (min) 55 min    Activity Tolerance Patient tolerated treatment well    Behavior During Therapy WFL for tasks assessed/performed             Past Medical History:  Diagnosis Date   Allergy    Cancer ([place])    Constipation    Edema, lower extremity    Food allergy, peanut    Lactose intolerance .   Personal history of chemotherapy    Pre-diabetes    Swelling of right ankle joint    Vitamin D deficiency    Past Surgical History:  Procedure Laterality Date   BREAST SURGERY     MASTECTOMY Left 1997   Patient Active Problem List   Diagnosis Date Noted   Right elbow pain 04/07/2021   Class 1 obesity due to excess calories without serious comorbidity with body mass index (BMI) of 31.0 to 31.9 in adult 02/16/2019   Bronchitis 02/23/2018   History of breast cancer in female 10/17/2014    PCP: Glendale Chard, MD  REFERRING PROVIDER: Leandrew Koyanagi, MD  REFERRING DIAG: [phone] (ICD-10-CM) - Pain in right elbow  THERAPY DIAG:  Pain in right elbow  Muscle weakness (generalized)   ONSET DATE: first noted pain in September 2022,   SUBJECTIVE:                                                                                                                                                                                     SUBJECTIVE STATEMENT: I first noted pain when I was using my laptop and had elbow lodged in couch while using my laptop.  I have had pain off and on since that time with a dull ache. I thought it was carpal tunnel . I did have a brace and I bought is from [facility].    PERTINENT HISTORY: Pre  diabetes, Breast CA remission,  PAIN:  Are you having pain? Yes NPRS scale: 4/10  at worst 10/10 Pain location: R lateral elbow Pain orientation: Right  PAIN TYPE: aching and dull Pain description: intermittent  Aggravating factors: when typing a lot, trying to lift items  maybe 20 # Relieving factors: not moving it  PRECAUTIONS: None  WEIGHT BEARING  RESTRICTIONS No  FALLS:  Has patient fallen in last 6 months? 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  PATIENT SURVEYS:  FOTO 72%  predicted 75%  COGNITION:  Overall cognitive status: Within functional limits for tasks assessed     SENSATION:  Light touch: Appears intact  Stereognosis: Appears intact  Hot/Cold: Appears intact  Proprioception: Appears intact  POSTURE: Slight forward head and R >L rounded shld  UPPER EXTREMITY AROM/PROM:  A/PROM Right 05/13/2021 Left 05/13/2021  Shoulder flexion 150 158  Shoulder extension    Shoulder abduction 147 155  Shoulder adduction    Shoulder internal rotation 58 60  Shoulder external rotation 90 90  Elbow flexion 135 140  Elbow extension 0 0  Wrist flexion 80 90  Wrist extension 51 72  Wrist ulnar deviation    Wrist radial deviation    Wrist pronation    Wrist supination    (Blank rows = not tested)  UPPER EXTREMITY MMT:  MMT Right 05/13/2021 Left 05/13/2021  Shoulder flexion 5 5  Shoulder extension    Shoulder abduction 5 5  Shoulder adduction    Shoulder internal rotation 5 5  Shoulder external rotation 5 5  Middle trapezius    Lower trapezius    Elbow flexion    Elbow  extension    Wrist flexion 5 5  Wrist extension 4pain 5  Wrist ulnar deviation    Wrist radial deviation 4pain 5  Wrist pronation    Wrist supination    Grip strength (lbs) 22.6lb 32.3 lb  (Blank rows = not tested)  SHOULDER SPECIAL TESTS: NT  JOINT MOBILITY TESTING:  NT  PALPATION:  Marked TTP over R lateral epicondyle   TODAY'S TREATMENT:  Initial education of work Scientist, water quality, Initial HEP   PATIENT EDUCATION: Education details: POC Explanation of findings initial HEP, work Dentist educated: Patient Education method: Consulting civil engineer, Media planner, Corporate treasurer cues, Verbal cues, and Handouts Education comprehension: verbalized understanding, returned demonstration, verbal cues required, and needs further education   HOME EXERCISE PROGRAM: Access Code: [email or web address] URL: [email or web address] Date: 05/13/2021 Prepared by: Voncille Lo  Exercises Seated Eccentric Wrist Extension - 1 x daily - 7 x weekly - 3 sets - 10 reps - 3 sec hold Seated Eccentric Wrist Flexion with Dumbbell - 1 x daily - 7 x weekly - 3 sets - 10 reps - 3 sec hold Standing Wrist Flexion Stretch - 2-3 x daily - 7 x weekly - 1 sets - 3 reps - 30 sec hold Standing Wrist Extension Stretch - 2-3 x daily - 7 x weekly - 1 sets - 3 reps - 30 sec hold Seated Elbow Manual Massage Clockwise - 1 x daily - 7 x weekly - 3 sets - 10 reps  Patient Education Tennis Elbow Ergonomics for Wrist, Elbow, or Forearm Discomfort Tips for Setting Up Your Home Workstation  ASSESSMENT:  CLINICAL IMPRESSION: 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  Pt has decreased grip strength on dominant hand R. 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   ACTIVITY LIMITATIONS meal prep and occupation.   PERSONAL FACTORS Profession are also affecting patient's functional outcome.    REHAB POTENTIAL: Excellent  CLINICAL DECISION MAKING: Stable/uncomplicated  EVALUATION COMPLEXITY: Low   GOALS: Goals reviewed with patient? 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, museum 06/10/2021 INITIAL  3 Pt will be educated on work Scientist, water quality and implement one idea Baseline:no knowledge 06/10/2021 INITIAL   LONG TERM GOALS:   LTG Name Target Date Goal status  1 Pt will improve grip strength at least 7 lb in R hand from eval Baseline:R 22.6  L 32.3 lb 07/08/2021 INITIAL  2 Pt will be able to be able to open jars/drinks without exacerbating pain greater than 2/10 Baseline: can be up to 10/10 and unable to open jars 07/08/2021 INITIAL  3 Pt will be able to demonstrate carrying groceries 25 lb without exacerbating pain Baseline: minimal pain on eval 07/08/2021 INITIAL  4 Pt will be able to demonstrate a push up with correct form in order to begin exercise program Baseline:Pt unable to perform a push up at eval due to wrist pain 07/08/2021 INITIAL  5 FOTO will improve from  72%   to   75%  indicating improved functional mobility.  Baseline:eval 72% 07/08/2021 INITIAL  6 Pt will be able to grip kettlebell and dumbbells in order to begin exercise program with progressive weights Baseline: Not participating in exercise at present 07/08/2021 INITIAL   PLAN: PT FREQUENCY: 1x/week  PT DURATION: 8 weeks  PLANNED INTERVENTIONS: Therapeutic exercises, Therapeutic activity, Neuromuscular re-education, Patient/Family  education, Joint mobilization, Dry Needling, Electrical stimulation, Cryotherapy, Moist heat, Taping, Ionotophoresis 4mg /ml Dexamethasone, Manual therapy, and Neuro Muscular re-education  PLAN FOR NEXT SESSION: Review and add to HEP, flexbar   Voncille Lo, PT, [place] Certified Exercise Expert for the Aging Adult  05/13/21 5:34 PM Phone: [phone] Fax: [phone]

## 2021-05-13 ENCOUNTER — Other Ambulatory Visit: Payer: Self-pay

## 2021-05-13 ENCOUNTER — Ambulatory Visit: Payer: BC Managed Care – PPO | Attending: Orthopaedic Surgery | Admitting: Physical Therapy

## 2021-05-13 DIAGNOSIS — M25521 Pain in right elbow: Secondary | ICD-10-CM | POA: Diagnosis not present

## 2021-05-13 DIAGNOSIS — M6281 Muscle weakness (generalized): Secondary | ICD-10-CM | POA: Diagnosis present

## 2021-05-13 NOTE — Patient Instructions (Signed)
Access Code: [email or web address] URL: [email or web address] Date: 05/13/2021 Prepared by: Voncille Lo  Exercises Seated Eccentric Wrist Extension - 1 x daily - 7 x weekly - 3 sets - 10 reps - 3 sec hold Seated Eccentric Wrist Flexion with Dumbbell - 1 x daily - 7 x weekly - 3 sets - 10 reps - 3 sec hold Standing Wrist Flexion Stretch - 2-3 x daily - 7 x weekly - 1 sets - 3 reps - 30 sec hold Standing Wrist Extension Stretch - 2-3 x daily - 7 x weekly - 1 sets - 3 reps - 30 sec hold Seated Elbow Manual Massage Clockwise - 1 x daily - 7 x weekly - 3 sets - 10 reps  Patient Education Tennis Elbow Ergonomics for Wrist, Elbow, or Forearm Discomfort Tips for Setting Up [place], [place], [facility] Certified Exercise Expert for the Aging Adult  05/13/21 4:33 PM Phone: [phone] Fax: [phone]

## 2021-05-22 ENCOUNTER — Other Ambulatory Visit: Payer: Self-pay

## 2021-05-22 ENCOUNTER — Encounter: Payer: Self-pay | Admitting: Physical Therapy

## 2021-05-22 ENCOUNTER — Ambulatory Visit: Payer: BC Managed Care – PPO | Attending: Orthopaedic Surgery | Admitting: Physical Therapy

## 2021-05-22 DIAGNOSIS — M25521 Pain in right elbow: Secondary | ICD-10-CM | POA: Diagnosis not present

## 2021-05-22 DIAGNOSIS — M6281 Muscle weakness (generalized): Secondary | ICD-10-CM | POA: Diagnosis present

## 2021-05-22 NOTE — Therapy (Addendum)
?OUTPATIENT PHYSICAL THERAPY TREATMENT NOTE ? ? ?Patient Name: Gloria Chen ?MRN: [id number] ?DOB:01-Sep-1960, 61 y.o., female ?Today's Date: 05/26/2021 ? ?PCP: Glendale Chard, MD ?REFERRING PROVIDER: Leandrew Koyanagi, MD ? ?  ? ?Past Medical History:  ?Diagnosis Date  ? Allergy   ? Cancer [facility])   ? Constipation   ? Edema, lower extremity   ? Food allergy, peanut   ? Lactose intolerance .  ? Personal history of chemotherapy   ? Pre-diabetes   ? Swelling of right ankle joint   ? Vitamin D deficiency   ? ?Past Surgical History:  ?Procedure Laterality Date  ? BREAST SURGERY    ? MASTECTOMY Left 1997  ? ?Patient Active Problem List  ? Diagnosis Date Noted  ? Right elbow pain 04/07/2021  ? Class 1 obesity due to excess calories without serious comorbidity with body mass index (BMI) of 31.0 to 31.9 in adult 02/16/2019  ? Bronchitis 02/23/2018  ? History of breast cancer in female 10/17/2014  ? ? ?REFERRING DIAG: M25.521 (ICD-10-CM) - Pain in right elbow ? ?THERAPY DIAG:  ?Pain in right elbow ? ?Muscle weakness (generalized) ? ?PERTINENT HISTORY: Pre diabetes, Breast CA remission, ? ?PRECAUTIONS: none ? ?SUBJECTIVE: "Patient reports when she wakes up, her elbow is bothering her. 8/10 pain in her right elbow when she woke up this morning." ? ?PAIN:  ?Are you having pain? Yes ?NPRS scale: 4/10 ?Pain location: right elbow ?Pain orientation: Right  ?PAIN TYPE: dull ?Pain description: intermittent  ?Aggravating factors: keyboard ?Relieving factors: rest ? ? ?OBJECTIVE:  ?  ?DIAGNOSTIC FINDINGS:  ?No acute or structural abnormalities. ?  ?PATIENT SURVEYS:  ?FOTO 72%  predicted 75% ?  ?COGNITION: ?         Overall cognitive status: Within functional limits for tasks assessed ?                              ?SENSATION: ?         Light touch: Appears intact ?         Stereognosis: Appears intact ?         Hot/Cold: Appears intact ?         Proprioception: Appears intact ?  ?POSTURE: ?Slight forward head and R >L rounded shld ?  ?UPPER  EXTREMITY AROM/PROM: ?  ?A/PROM Right ?05/13/2021 Left ?05/13/2021  ?Shoulder flexion 150 158  ?Shoulder extension      ?Shoulder abduction 147 155  ?Shoulder adduction      ?Shoulder internal rotation 58 60  ?Shoulder external rotation 90 90  ?Elbow flexion 135 140  ?Elbow extension 0 0  ?Wrist flexion 80 90  ?Wrist extension 51 72  ?Wrist ulnar deviation      ?Wrist radial deviation      ?Wrist pronation      ?Wrist supination      ?(Blank rows = not tested) ?  ?UPPER EXTREMITY MMT: ?  ?MMT Right ?05/13/2021 Left ?05/13/2021  ?Shoulder flexion 5 5  ?Shoulder extension      ?Shoulder abduction 5 5  ?Shoulder adduction      ?Shoulder internal rotation 5 5  ?Shoulder external rotation 5 5  ?Middle trapezius      ?Lower trapezius      ?Elbow flexion      ?Elbow extension      ?Wrist flexion 5 5  ?Wrist extension 4pain 5  ?Wrist ulnar deviation      ?Wrist  radial deviation 4pain 5  ?Wrist pronation      ?Wrist supination      ?Grip strength (lbs) 22.6lb 32.3 lb  ?(Blank rows = not tested) ?  ?SHOULDER SPECIAL TESTS: ?NT ?  ?JOINT MOBILITY TESTING:  ?NT ?  ?PALPATION:  ?Marked TTP over R lateral epicondyle ?           ?TODAY'S TREATMENT:  ? ?[place] Adult PT Treatment:                                                DATE: 05/22/21 ?Therapeutic Exercise: ?Seated eccentric wrist extension 1# 2x12 ?Seated wrist radial deviation 1# 2x12 ?Seated wrist flexion stretch 3x 30 sec ?Resisted finger extension with green  2x10 ?Flexbar supination 2x10 ?Discussed HEP and gentle wrist flexion stretching throughout the day. ?Manual Therapy: ?STW to right common extensor tendon clockwise and counter clockwise ?MTPR to right common extensor tendon attempted, but not tolerated ? ?  ?  ?PATIENT EDUCATION: ?Education details: POC Explanation of findings initial HEP, work Scientist, water quality ?Person educated: Patient ?Education method: Explanation, Demonstration, Tactile cues, Verbal cues, and Handouts ?Education comprehension: verbalized  understanding, returned demonstration, verbal cues required, and needs further education ?  ?  ?HOME EXERCISE PROGRAM: ?Access Code: [email or web address] ?URL: [email or web address] ?Date: 05/13/2021 ?Prepared by: Voncille Lo ?  ?Exercises ?Seated Eccentric Wrist Extension - 1 x daily - 7 x weekly - 3 sets - 10 reps - 3 sec hold ?Seated Eccentric Wrist Flexion with Dumbbell - 1 x daily - 7 x weekly - 3 sets - 10 reps - 3 sec hold ?Standing Wrist Flexion Stretch - 2-3 x daily - 7 x weekly - 1 sets - 3 reps - 30 sec hold ?Standing Wrist Extension Stretch - 2-3 x daily - 7 x weekly - 1 sets - 3 reps - 30 sec hold ?Seated Elbow Manual Massage Clockwise - 1 x daily - 7 x weekly - 3 sets - 10 reps ?  ?Patient Education ?Tennis Elbow ?Ergonomics for Wrist, Elbow, or Forearm Discomfort ?Tips for Setting Up Your Home Workstation ?  ?ASSESSMENT: ?  ?CLINICAL IMPRESSION: ?Lynnie tolerated PT session well today with reports of decreased pain in her right elbow. Session focused on eccentrics of wrist extension and finger extension. Patient tolerated these exercises well with slight fatigue. She was tender when MTPR was attempted on common extensor tendon of right elbow, but she tolerated STW well.  ?  ?OBJECTIVE IMPAIRMENTS decreased activity tolerance, decreased strength, increased fascial restrictions, impaired UE functional use, improper body mechanics, and pain.  ?  ?ACTIVITY LIMITATIONS meal prep and occupation.  ?  ?PERSONAL FACTORS Profession are also affecting patient's functional outcome.  ?  ?  ?REHAB POTENTIAL: Excellent ?  ?CLINICAL DECISION MAKING: Stable/uncomplicated ?  ?EVALUATION COMPLEXITY: Low ?  ?  ?GOALS: ?Goals reviewed with patient? Yes ?  ?SHORT TERM GOALS: ?  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  ?LONG TERM GOALS:  ?  ?LTG Name Target Date Goal status  ?  1 Pt will improve grip strength at least 7 lb in R hand from eval ?Baseline:R 22.6  L 32.3 lb 07/08/2021 INITIAL  ?2 Pt will be able to be able to open jars/drinks without exacerbating pain greater than 2/10 ?Baseline: can be up to 10/10 and unable to open jars 07/08/2021 INITIAL  ?3 Pt will be able to demonstrate carrying groceries 25 lb without exacerbating pain ?Baseline: minimal pain on eval 07/08/2021 INITIAL  ?4 Pt will be able to demonstrate a push up with correct form in order to begin exercise program ?Baseline:Pt unable to perform a push up at eval due to wrist pain 07/08/2021 INITIAL  ?5 FOTO will improve from  72%   to   75%  indicating improved functional mobility.  ?Baseline:eval 72% 07/08/2021 INITIAL  ?6 Pt will be able to grip kettlebell and dumbbells in order to begin exercise program with progressive weights ?Baseline: Not participating in exercise at present 07/08/2021 INITIAL  ?  ?PLAN: ?PT FREQUENCY: 1x/week ?  ?PT DURATION: 8 weeks ?  ?PLANNED INTERVENTIONS: Therapeutic exercises, Therapeutic activity, Neuromuscular re-education, Patient/Family education, Joint mobilization, Dry Needling, Electrical stimulation, Cryotherapy, Moist heat, Taping, Ionotophoresis '4mg'$ /ml Dexamethasone, Manual therapy, and Neuro Muscular re-education ?  ?PLAN FOR NEXT SESSION: Review and add to HEP as needed. Focusing on eccentrics and manual therapy.  ? ? ? ?Gillermina Phy, PT, DPT ?05/26/2021, 8:55 AM ? ?   ?

## 2021-05-29 ENCOUNTER — Ambulatory Visit: Payer: BC Managed Care – PPO | Admitting: Physical Therapy

## 2021-05-29 ENCOUNTER — Other Ambulatory Visit: Payer: Self-pay

## 2021-05-29 ENCOUNTER — Encounter: Payer: Self-pay | Admitting: Physical Therapy

## 2021-05-29 DIAGNOSIS — M25521 Pain in right elbow: Secondary | ICD-10-CM | POA: Diagnosis not present

## 2021-05-29 NOTE — Therapy (Addendum)
?OUTPATIENT PHYSICAL THERAPY TREATMENT NOTE ? ? ?Patient Name: Gloria Chen ?MRN: [id number] ?DOB:[birth year], 62 y.o., female ?Today's Date: 06/12/2021 ? ?PCP: Glendale Chard, MD ?REFERRING PROVIDER: Glendale Chard, MD ? ? ? ?Past Medical History:  ?Diagnosis Date  ? Allergy   ? Cancer [facility])   ? Constipation   ? Edema, lower extremity   ? Food allergy, peanut   ? Lactose intolerance .  ? Personal history of chemotherapy   ? Pre-diabetes   ? Swelling of right ankle joint   ? Vitamin D deficiency   ? ?Past Surgical History:  ?Procedure Laterality Date  ? BREAST SURGERY    ? MASTECTOMY Left 1997  ? ?Patient Active Problem List  ? Diagnosis Date Noted  ? Right elbow pain 04/07/2021  ? Class 1 obesity due to excess calories without serious comorbidity with body mass index (BMI) of 31.0 to 31.9 in adult 02/16/2019  ? Bronchitis 02/23/2018  ? History of breast cancer in female 10/17/2014  ? ? ?REFERRING DIAG: M25.521 (ICD-10-CM) - Pain in right elbow ? ?THERAPY DIAG:  ?Pain in right elbow ? ?Muscle weakness (generalized) ? ?PERTINENT HISTORY: Pre diabetes, Breast CA remission, ? ?PRECAUTIONS: none ? ?SUBJECTIVE: " I was achey after the last session and I was a little achey this morning. Right now it is feeling okay." ? ?PAIN:  ?Are you having pain? Yes: NPRS scale: 3/10 ?Pain location: Lateral elbow ?Pain description: dull ache ?Aggravating factors: sleeping/ getting out of bed in the, typing. ?Relieving factors: stretches ? ? ? ?OBJECTIVE:  ?  ?DIAGNOSTIC FINDINGS:  ?No acute or structural abnormalities. ?  ?PATIENT SURVEYS:  ?FOTO 72%  predicted 75% ?  ?COGNITION: ?         Overall cognitive status: Within functional limits for tasks assessed ?                              ?SENSATION: ?         Light touch: Appears intact ?         Stereognosis: Appears intact ?         Hot/Cold: Appears intact ?         Proprioception: Appears intact ?  ?POSTURE: ?Slight forward head and R >L rounded shld ?  ?UPPER EXTREMITY AROM/PROM: ?   ?A/PROM Right ?05/13/2021 Left ?05/13/2021  ?Shoulder flexion 150 158  ?Shoulder extension      ?Shoulder abduction 147 155  ?Shoulder adduction      ?Shoulder internal rotation 58 60  ?Shoulder external rotation 90 90  ?Elbow flexion 135 140  ?Elbow extension 0 0  ?Wrist flexion 80 90  ?Wrist extension 51 72  ?Wrist ulnar deviation      ?Wrist radial deviation      ?Wrist pronation      ?Wrist supination      ?(Blank rows = not tested) ?  ?UPPER EXTREMITY MMT: ?  ?MMT Right ?05/13/2021 Left ?05/13/2021  ?Shoulder flexion 5 5  ?Shoulder extension      ?Shoulder abduction 5 5  ?Shoulder adduction      ?Shoulder internal rotation 5 5  ?Shoulder external rotation 5 5  ?Middle trapezius      ?Lower trapezius      ?Elbow flexion      ?Elbow extension      ?Wrist flexion 5 5  ?Wrist extension 4pain 5  ?Wrist ulnar deviation      ?Wrist radial deviation  4pain 5  ?Wrist pronation      ?Wrist supination      ?Grip strength (lbs) 22.6lb 32.3 lb  ?(Blank rows = not tested) ?  ?SHOULDER SPECIAL TESTS: ?NT ?  ?JOINT MOBILITY TESTING:  ?NT ?  ?PALPATION:  ?Marked TTP over R lateral epicondyle ?           ?TODAY'S TREATMENT:  ?[facility] Adult PT Treatment:                                                DATE: 05/29/2021 ?Therapeutic Exercise: ?Wrist flexor stretch PNF contract/ relax with 10 sec contraction  ?Wrist extension eccentric loading with GTB in supine 2 x 10 ? ?Reviewed importance of doing the strengthening in combination with the stretch to maximize benefit of PT ?Manual Therapy: ?MWM lateral elbow with gripping grade III ?MTPR along the proximal common extensors ands supinator ?IASTM along the common extensions and tendon ?Trial of pre-wrap anchor strap just distal to the elbow joint ? ? ? ? ?[place] Adult PT Treatment:                                                DATE: 05/22/21 ?Therapeutic Exercise: ?Seated eccentric wrist extension 1# 2x12 ?Seated wrist radial deviation 1# 2x12 ?Seated wrist flexion stretch 3x 30 sec ?Resisted  finger extension with green  2x10 ?Flexbar supination 2x10 ?Discussed HEP and gentle wrist flexion stretching throughout the day. ?Manual Therapy: ?STW to right common extensor tendon clockwise and counter clockwise ?MTPR to right common extensor tendon attempted, but not tolerated ? ?  ?  ?PATIENT EDUCATION: 05/29/2021 ?Education details: reivewed TPDN and provided handout ?Person educated: Patient ?Education method: Explanation, Demonstration, Tactile cues, Verbal cues, and Handouts ?Education comprehension: verbalized understanding, returned demonstration, verbal cues required, and needs further education ?  ?  ?HOME EXERCISE PROGRAM: ?Access Code: [email or web address] ?URL: [email or web address] ?Date: 05/13/2021 ?Prepared by: Voncille Lo ?  ?Exercises ?Seated Eccentric Wrist Extension - 1 x daily - 7 x weekly - 3 sets - 10 reps - 3 sec hold ?Seated Eccentric Wrist Flexion with Dumbbell - 1 x daily - 7 x weekly - 3 sets - 10 reps - 3 sec hold ?Standing Wrist Flexion Stretch - 2-3 x daily - 7 x weekly - 1 sets - 3 reps - 30 sec hold ?Standing Wrist Extension Stretch - 2-3 x daily - 7 x weekly - 1 sets - 3 reps - 30 sec hold ?Seated Elbow Manual Massage Clockwise - 1 x daily - 7 x weekly - 3 sets - 10 reps ?  ?Patient Education ?Tennis Elbow ?Ergonomics for Wrist, Elbow, or Forearm Discomfort ?Tips for Setting Up Your Home Workstation ?  ?ASSESSMENT: ?  ?CLINICAL IMPRESSION: ?Pt arrives to session noting some soreness located at the lateral epicondyle. Reviewed TPDN which pt opted to hold off on today so focused on MTPR techniques followed with IASTM and lateral elbow mobs with movement. Continued stretching and eccentric loading of the common extensors which she noted soreness with but was able to complete the exercise. Trialed using pre-wrap elbow strap to create anchor and reduce tension. End of session she reported some soreness from today's session. ?  ?OBJECTIVE IMPAIRMENTS decreased activity  tolerance,  decreased strength, increased fascial restrictions, impaired UE functional use, improper body mechanics, and pain.  ?  ?ACTIVITY LIMITATIONS meal prep and occupation.  ?  ?PERSONAL FACTORS Profession are also affecting patient's functional outcome.  ?  ?  ?REHAB POTENTIAL: Excellent ?  ?CLINICAL DECISION MAKING: Stable/uncomplicated ?  ?EVALUATION COMPLEXITY: Low ?  ?  ?GOALS: ?Goals reviewed with patient? Yes ?  ?SHORT TERM GOALS: ?  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  ?LONG TERM GOALS:  ?  ?LTG Name Target Date Goal status  ?1 Pt will improve grip strength at least 7 lb in R hand from eval ?Baseline:R 22.6  L 32.3 lb 07/08/2021 INITIAL  ?2 Pt will be able to be able to open jars/drinks without exacerbating pain greater than 2/10 ?Baseline: can be up to 10/10 and unable to open jars 07/08/2021 INITIAL  ?3 Pt will be able to demonstrate carrying groceries 25 lb without exacerbating pain ?Baseline: minimal pain on eval 07/08/2021 INITIAL  ?4 Pt will be able to demonstrate a push up with correct form in order to begin exercise program ?Baseline:Pt unable to perform a push up at eval due to wrist pain 07/08/2021 INITIAL  ?5 FOTO will improve from  72%   to   75%  indicating improved functional mobility.  ?Baseline:eval 72% 07/08/2021 INITIAL  ?6 Pt will be able to grip kettlebell and dumbbells in order to begin exercise program with progressive weights ?Baseline: Not participating in exercise at present 07/08/2021 INITIAL  ?  ?PLAN: ?PT FREQUENCY: 1x/week ?  ?PT DURATION: 8 weeks ?  ?PLANNED INTERVENTIONS: Therapeutic exercises, Therapeutic activity, Neuromuscular  re-education, Patient/Family education, Joint mobilization, Dry Needling, Electrical stimulation, Cryotherapy, Moist heat, Taping, Ionotophoresis '4mg'$ /ml Dexamethasone, Manual therapy, and Neuro Muscular re-educat

## 2021-06-05 ENCOUNTER — Ambulatory Visit: Payer: BC Managed Care – PPO | Admitting: Physical Therapy

## 2021-06-12 ENCOUNTER — Ambulatory Visit: Payer: BC Managed Care – PPO | Admitting: Physical Therapy

## 2021-06-12 ENCOUNTER — Encounter: Payer: Self-pay | Admitting: Physical Therapy

## 2021-06-12 DIAGNOSIS — M6281 Muscle weakness (generalized): Secondary | ICD-10-CM

## 2021-06-12 DIAGNOSIS — M25521 Pain in right elbow: Secondary | ICD-10-CM

## 2021-06-12 NOTE — Therapy (Signed)
?OUTPATIENT PHYSICAL THERAPY TREATMENT NOTE ? ? ?Patient Name: Gloria Chen ?MRN: [id number] ?DOB:[birth year], 61 y.o., female ?Today's Date: 06/12/2021 ? ?PCP: Glendale Chard, MD ?REFERRING PROVIDER: Leandrew Koyanagi, MD ? ? PT End of Session - 06/12/21 1516   ? ? Visit Number 4   ? Number of Visits 8   ? Date for PT Re-Evaluation 07/08/21   ? Authorization Type [employer]   ? PT Start Time 1324   pt arrived to session late  ? PT Stop Time 4010   ? PT Time Calculation (min) 32 min   ? Activity Tolerance Patient tolerated treatment well   ? Behavior During Therapy [facility] for tasks assessed/performed   ? ?  ?  ? ?  ? ? ?Past Medical History:  ?Diagnosis Date  ? Allergy   ? Cancer [facility])   ? Constipation   ? Edema, lower extremity   ? Food allergy, peanut   ? Lactose intolerance .  ? Personal history of chemotherapy   ? Pre-diabetes   ? Swelling of right ankle joint   ? Vitamin D deficiency   ? ?Past Surgical History:  ?Procedure Laterality Date  ? BREAST SURGERY    ? MASTECTOMY Left 1997  ? ?Patient Active Problem List  ? Diagnosis Date Noted  ? Right elbow pain 04/07/2021  ? Class 1 obesity due to excess calories without serious comorbidity with body mass index (BMI) of 31.0 to 31.9 in adult 02/16/2019  ? Bronchitis 02/23/2018  ? History of breast cancer in female 10/17/2014  ? ? ?REFERRING DIAG: M25.521 (ICD-10-CM) - Pain in right elbow ? ?THERAPY DIAG:  ?Pain in right elbow ? ?Muscle weakness (generalized) ? ?PERTINENT HISTORY: Pre diabetes, Breast CA remission, ? ?PRECAUTIONS: none ? ?SUBJECTIVE: "I was pretty sore the last few days after the last session but today I am doing pretty good." ? ?PAIN:  ?Are you having pain? Yes: NPRS scale: 3/10 ?Pain location: Lateral elbow ?Pain description: dull ache ?Aggravating factors: sleeping/ getting out of bed in the, typing. ?Relieving factors: stretches ? ? ? ?OBJECTIVE:  ?  ?DIAGNOSTIC FINDINGS:  ? No acute or structural abnormalities. ?  ?PATIENT SURVEYS:  ?FOTO 72%  predicted  75% ?  ?COGNITION: ?         Overall cognitive status: Within functional limits for tasks assessed ?                              ?SENSATION: ?         Light touch: Appears intact ?         Stereognosis: Appears intact ?         Hot/Cold: Appears intact ?         Proprioception: Appears intact ?  ?POSTURE: ?Slight forward head and R >L rounded shld ?  ?UPPER EXTREMITY AROM/PROM: ?  ?A/PROM Right ?05/13/2021 Left ?05/13/2021  ?Shoulder flexion 150 158  ?Shoulder extension      ?Shoulder abduction 147 155  ?Shoulder adduction      ?Shoulder internal rotation 58 60  ?Shoulder external rotation 90 90  ?Elbow flexion 135 140  ?Elbow extension 0 0  ?Wrist flexion 80 90  ?Wrist extension 51 72  ?Wrist ulnar deviation      ?Wrist radial deviation      ?Wrist pronation      ?Wrist supination      ?(Blank rows = not tested) ?  ?UPPER EXTREMITY MMT: ?  ?  MMT Right ?05/13/2021 Left ?05/13/2021  ?Shoulder flexion 5 5  ?Shoulder extension      ?Shoulder abduction 5 5  ?Shoulder adduction      ?Shoulder internal rotation 5 5  ?Shoulder external rotation 5 5  ?Middle trapezius      ?Lower trapezius      ?Elbow flexion      ?Elbow extension      ?Wrist flexion 5 5  ?Wrist extension 4pain 5  ?Wrist ulnar deviation      ?Wrist radial deviation 4pain 5  ?Wrist pronation      ?Wrist supination      ?Grip strength (lbs) 22.6lb 32.3 lb  ?(Blank rows = not tested) ?  ?SHOULDER SPECIAL TESTS: ?NT ?  ?JOINT MOBILITY TESTING:  ?NT ?  ?PALPATION:  ?Marked TTP over R lateral epicondyle ?           ?TODAY'S TREATMENT:  ?[facility] Adult PT Treatment:                                                DATE: 06/12/2021 ?Therapeutic Exercise: ?UBE L4 FWD/BWD x 2 min ea ?Rows 2 x 12 with GTB - cues to avoid hiking the shoulder ?Shoulder IR/ER 2 x 12 with GTB ?Scaption 2 x 12 with 3# ?Wrist extensor stretch 2 x 30 sec ? ?Updated HEP today  ? ? ?[facility] Adult PT Treatment:                                                DATE: 05/29/2021 ?Therapeutic Exercise: ?Wrist  flexor stretch PNF contract/ relax with 10 sec contraction  ?Wrist extension eccentric loading with GTB in supine 2 x 10 ? ?Reviewed importance of doing the strengthening in combination with the stretch to maximize benefit of PT ?Manual Therapy: ?MWM lateral elbow with gripping grade III ?MTPR along the proximal common extensors ands supinator ?IASTM along the common extensions and tendon ?Trial of pre-wrap anchor strap just distal to the elbow joint ? ?[facility] Adult PT Treatment:                                                DATE: 05/22/21 ?Therapeutic Exercise: ?Seated eccentric wrist extension 1# 2x12 ?Seated wrist radial deviation 1# 2x12 ?Seated wrist flexion stretch 3x 30 sec ?Resisted finger extension with green  2x10 ?Flexbar supination 2x10 ?Discussed HEP and gentle wrist flexion stretching throughout the day. ?Manual Therapy: ?STW to right common extensor tendon clockwise and counter clockwise ?MTPR to right common extensor tendon attempted, but not tolerated ? ?  ?  ?PATIENT EDUCATION: 05/29/2021 ?Education details: reivewed TPDN and provided handout ?Person educated: Patient ?Education method: Explanation, Demonstration, Tactile cues, Verbal cues, and Handouts ?Education comprehension: verbalized understanding, returned demonstration, verbal cues required, and needs further education ?  ?  ?HOME EXERCISE PROGRAM: ?Access Code: [email or web address] ?URL: [email or web address] ?Date: 06/12/2021 ?Prepared by: Starr Lake ? ?Exercises ?- Seated Eccentric Wrist Extension  - 1 x daily - 7 x weekly - 3 sets - 10 reps - 3 sec hold ?- Seated Eccentric Wrist Flexion with Dumbbell  -  1 x daily - 7 x weekly - 3 sets - 10 reps - 3 sec hold ?- Standing Wrist Flexion Stretch  - 2-3 x daily - 7 x weekly - 1 sets - 3 reps - 30 sec hold ?- Standing Wrist Extension Stretch  - 2-3 x daily - 7 x weekly - 1 sets - 3 reps - 30 sec hold ?- Seated Elbow Manual Massage Clockwise  - 1 x daily - 7 x weekly - 3 sets - 10 reps ?-  Standing Shoulder Row with Anchored Resistance  - 1 x daily - 7 x weekly - 2 sets - 10 reps ?- Shoulder Internal Rotation  - 1 x daily - 7 x weekly - 2 sets - 10 reps ?- Shoulder External Rotation  - 1 x daily - 7 x weekly - 2 sets - 10 reps ?- Standing Shoulder Scaption  - 1 x daily - 7 x weekly - 2 sets - 10 reps ? ?Patient Education ?- Tennis Elbow ?- Ergonomics for Wrist, Elbow, or Forearm Discomfort ?- Tips for Setting Up Your Home Workstation ?  ?ASSESSMENT: ?  ?CLINICAL IMPRESSION: ?Mrs Scheffler arrives to session reporting she was sore following the last session but is doing better today with no report of pain. Focused session on shoulder strengthening for rotator cuff and scapular stability. She did well with prescribed exercise but did fatigue quickly with ER.  ? ?OBJECTIVE IMPAIRMENTS decreased activity tolerance, decreased strength, increased fascial restrictions, impaired UE functional use, improper body mechanics, and pain.  ?  ?ACTIVITY LIMITATIONS meal prep and occupation.  ?  ?PERSONAL FACTORS Profession are also affecting patient's functional outcome.  ?  ?  ?REHAB POTENTIAL: Excellent ?  ?CLINICAL DECISION MAKING: Stable/uncomplicated ?  ?EVALUATION COMPLEXITY: Low ?  ?  ?GOALS: ?Goals reviewed with patient? Yes ?  ?SHORT TERM GOALS: ?  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  ?LONG TERM GOALS:  ?  ?LTG Name Target Date Goal status  ?1 Pt will improve grip strength at least 7 lb in R hand from eval ?Baseline:R 22.6  L 32.3 lb 07/08/2021 INITIAL  ?2 Pt will be able to be able to open jars/drinks without exacerbating pain greater than  2/10 ?Baseline: can be up to 10/10 and unable to open jars 07/08/2021 INITIAL  ?3 Pt will be able to demonstrate carrying groceries 25 lb without exacerbating pain ?Baseline: minimal pain on eval 07/08/2021 INITI

## 2021-06-18 NOTE — Therapy (Signed)
?OUTPATIENT PHYSICAL THERAPY TREATMENT NOTE ? ? ?Patient Name: Gloria Chen ?MRN: [id number] ?DOB:[birth year], 61 y.o., female ?Today's Date: 06/19/2021 ? ?PCP: Glendale Chard, MD ?REFERRING PROVIDER: Leandrew Koyanagi, MD ? ? PT End of Session - 06/19/21 1501   ? ? Visit Number 5   ? Number of Visits 8   ? Date for PT Re-Evaluation 07/08/21   ? Authorization Type BCBS/Aetna   ? PT Start Time 1500   ? PT Stop Time 1543   ? PT Time Calculation (min) 43 min   ? Activity Tolerance Patient tolerated treatment well   ? Behavior During Therapy [facility] for tasks assessed/performed   ? ?  ?  ? ?  ? ? ? ?Past Medical History:  ?Diagnosis Date  ? Allergy   ? Cancer [facility])   ? Constipation   ? Edema, lower extremity   ? Food allergy, peanut   ? Lactose intolerance .  ? Personal history of chemotherapy   ? Pre-diabetes   ? Swelling of right ankle joint   ? Vitamin D deficiency   ? ?Past Surgical History:  ?Procedure Laterality Date  ? BREAST SURGERY    ? MASTECTOMY Left 1997  ? ?Patient Active Problem List  ? Diagnosis Date Noted  ? Right elbow pain 04/07/2021  ? Class 1 obesity due to excess calories without serious comorbidity with body mass index (BMI) of 31.0 to 31.9 in adult 02/16/2019  ? Bronchitis 02/23/2018  ? History of breast cancer in female 10/17/2014  ? ? ?REFERRING DIAG: M25.521 (ICD-10-CM) - Pain in right elbow ? ?THERAPY DIAG:  ?Pain in right elbow ? ?Muscle weakness (generalized) ? ?PERTINENT HISTORY: Pre diabetes, Breast CA remission, ? ?PRECAUTIONS: none ? ?SUBJECTIVE: I feel it the worse throughout the night and when I wake up. I am 2/10. I feel it more like a 6/10 ?I did not like the pre wrap strap ?PAIN:  ?Are you having pain? Yes: NPRS scale: 2/10 ?Pain location: Lateral elbow ?Pain description: dull ache ?Aggravating factors: sleeping/ getting out of bed in the, typing. ?Relieving factors: stretches ? ?At worst at night and waking in morning. 6/10 ? ?OBJECTIVE:  ?  ?DIAGNOSTIC FINDINGS:  ? No acute or structural  abnormalities. ?  ?PATIENT SURVEYS:  ?FOTO 72%  predicted 75% ?  ?COGNITION: ?         Overall cognitive status: Within functional limits for tasks assessed ?                              ?SENSATION: ?         Light touch: Appears intact ?         Stereognosis: Appears intact ?         Hot/Cold: Appears intact ?         Proprioception: Appears intact ?  ?POSTURE: ?Slight forward head and R >L rounded shld ?  ?UPPER EXTREMITY AROM/PROM: ?  ?A/PROM Right ?05/13/2021 Left ?05/13/2021  ?Shoulder flexion 150 158  ?Shoulder extension      ?Shoulder abduction 147 155  ?Shoulder adduction      ?Shoulder internal rotation 58 60  ?Shoulder external rotation 90 90  ?Elbow flexion 135 140  ?Elbow extension 0 0  ?Wrist flexion 80 90  ?Wrist extension 51 72  ?Wrist ulnar deviation      ?Wrist radial deviation      ?Wrist pronation      ?Wrist supination      ?(  Blank rows = not tested) ?  ?UPPER EXTREMITY MMT: ?  ?MMT Right ?05/13/2021 Left ?05/13/2021  ?Shoulder flexion 5 5  ?Shoulder extension      ?Shoulder abduction 5 5  ?Shoulder adduction      ?Shoulder internal rotation 5 5  ?Shoulder external rotation 5 5  ?Middle trapezius      ?Lower trapezius      ?Elbow flexion      ?Elbow extension      ?Wrist flexion 5 5  ?Wrist extension 4pain 5  ?Wrist ulnar deviation      ?Wrist radial deviation 4pain 5  ?Wrist pronation      ?Wrist supination      ?Grip strength (lbs) 22.6lb 32.3 lb  ?(Blank rows = not tested) ?  ?SHOULDER SPECIAL TESTS: ?NT ?  ?JOINT MOBILITY TESTING:  ?NT ?  ?PALPATION:  ?Marked TTP over R lateral epicondyle ?           ?TODAY'S TREATMENT:  ?[facility] Adult PT Treatment:                                                DATE: 06-19-21 ?Therapeutic Exercise: ?UBE L4 FWD/BWD x 2 min ea ?Yellow flex bar 10 x  R wrist extension eccentrically releasing ?3lb for wrist extensor eccentric load 2 x 10 ?Supination/pronation 2 x 10 ?Rows 2 x 12 with GTB - cues to avoid hiking the shoulder ?Shoulder IR/ER 2 x 12 with GTB ?Scaption 2 x  12 with 3# ?Wrist extensor stretch 2 x 30 sec ?Manual Therapy: ?Friction cross fiber massage and taught Pt to perform on her  ? ?Modalities: ?Iontophoresis patch 1 cc Dexamethasone on left lateral epicondyle and tendon for wrist extensors.  Patch to be removed after 4-to 6 hours ? ?[facility] Adult PT Treatment:                                                DATE: 06/12/2021 ?Therapeutic Exercise: ?UBE L4 FWD/BWD x 2 min ea ?Rows 2 x 12 with GTB - cues to avoid hiking the shoulder ?Shoulder IR/ER 2 x 12 with GTB ?Scaption 2 x 12 with 3# ?Wrist extensor stretch 2 x 30 sec ? ?Updated HEP today  ? ? ?[facility] Adult PT Treatment:                                                DATE: 05/29/2021 ?Therapeutic Exercise: ?Wrist flexor stretch PNF contract/ relax with 10 sec contraction  ?Wrist extension eccentric loading with GTB in supine 2 x 10 ? ?Reviewed importance of doing the strengthening in combination with the stretch to maximize benefit of PT ?Manual Therapy: ?MWM lateral elbow with gripping grade III ?MTPR along the proximal common extensors ands supinator ?IASTM along the common extensions and tendon ?Trial of pre-wrap anchor strap just distal to the elbow joint ? ?[facility] Adult PT Treatment:  DATE: 05/22/21 ?Therapeutic Exercise: ?Seated eccentric wrist extension 1# 2x12 ?Seated wrist radial deviation 1# 2x12 ?Seated wrist flexion stretch 3x 30 sec ?Resisted finger extension with green  2x10 ?Flexbar supination 2x10 ?Discussed HEP and gentle wrist flexion stretching throughout the day. ?Manual Therapy: ?STW to right common extensor tendon clockwise and counter clockwise ?MTPR to right common extensor tendon attempted, but not tolerated ? ?  ?  ?PATIENT EDUCATION: 05/29/2021 ?Education details: reivewed TPDN and provided handout, iontophoresis information ?Person educated: Patient ?Education method: Explanation, Demonstration, Tactile cues, Verbal cues, and Handouts ?Education  comprehension: verbalized understanding, returned demonstration, verbal cues required, and needs further education ?  ?  ?HOME EXERCISE PROGRAM: ?Access Code: [email or web address] ?URL: [email or web address] ?Date: 06/12/2021 ?Prepared by: Starr Lake ? ?Exercises ?- Seated Eccentric Wrist Extension  - 1 x daily - 7 x weekly - 3 sets - 10 reps - 3 sec hold ?- Seated Eccentric Wrist Flexion with Dumbbell  - 1 x daily - 7 x weekly - 3 sets - 10 reps - 3 sec hold ?- Standing Wrist Flexion Stretch  - 2-3 x daily - 7 x weekly - 1 sets - 3 reps - 30 sec hold ?- Standing Wrist Extension Stretch  - 2-3 x daily - 7 x weekly - 1 sets - 3 reps - 30 sec hold ?- Seated Elbow Manual Massage Clockwise  - 1 x daily - 7 x weekly - 3 sets - 10 reps ?- Standing Shoulder Row with Anchored Resistance  - 1 x daily - 7 x weekly - 2 sets - 10 reps ?- Shoulder Internal Rotation  - 1 x daily - 7 x weekly - 2 sets - 10 reps ?- Shoulder External Rotation  - 1 x daily - 7 x weekly - 2 sets - 10 reps ?- Standing Shoulder Scaption  - 1 x daily - 7 x weekly - 2 sets - 10 reps ? ?Patient Education ?- Tennis Elbow ?- Ergonomics for Wrist, Elbow, or Forearm Discomfort ?- Tips for Setting Up Your Home Workstation ?  ?ASSESSMENT: ?  ?CLINICAL IMPRESSION: ?Mrs Blowers arrives to session  reporting 2/10 pain but also reporting she has 6/10 pain at night and waking in the morning.  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 She is hoping to concentrate on consistency with spring break next week.  Pt has lateral epicondylitis and was educated on cross friction massage and iontophoresis patch today after exercise. ? ?OBJECTIVE IMPAIRMENTS decreased activity tolerance, decreased strength, increased fascial restrictions, impaired UE functional use, improper body mechanics, and pain.  ?  ?ACTIVITY LIMITATIONS meal prep and occupation.  ?  ?PERSONAL FACTORS Profession are also affecting patient's functional  outcome.  ?  ?  ?REHAB POTENTIAL: Excellent ?  ?CLINICAL DECISION MAKING: Stable/uncomplicated ?  ?EVALUATION COMPLEXITY: Low ?  ?  ?GOALS: ?Goals reviewed with patient? Yes ?  ?SHORT TERM GOALS: ?  ?STG Name Target Dat

## 2021-06-19 ENCOUNTER — Encounter: Payer: Self-pay | Admitting: Physical Therapy

## 2021-06-19 ENCOUNTER — Ambulatory Visit: Payer: BC Managed Care – PPO | Attending: Orthopaedic Surgery | Admitting: Physical Therapy

## 2021-06-19 DIAGNOSIS — M25521 Pain in right elbow: Secondary | ICD-10-CM | POA: Insufficient documentation

## 2021-06-19 DIAGNOSIS — M6281 Muscle weakness (generalized): Secondary | ICD-10-CM | POA: Diagnosis present

## 2021-06-19 NOTE — Patient Instructions (Signed)
IONTOPHORESIS PATIENT PRECAUTIONS & CONTRAINDICATIONS: ? ?Redness under one or both electrodes can occur.  This characterized by a uniform redness that usually disappears within 12 hours of treatment. ?Small pinhead size blisters may result in response to the drug.  Contact your physician if the problem persists more than 24 hours. ?On rare occasions, iontophoresis therapy can result in temporary skin reactions such as rash, inflammation, irritation or burns.  The skin reactions may be the result of individual sensitivity to the ionic solution used, the condition of the skin at the start of treatment, reaction to the materials in the electrodes, allergies or sensitivity to dexamethasone, or a poor connection between the patch and your skin.  Discontinue using iontophoresis if you have any of these reactions and report to your therapist. ?Remove the Patch? or electrodes if you have any undue sensation of pain or burning during the treatment and report discomfort to your therapist. ?Tell your Therapist if you have had known adverse reactions to the application of electrical current. ?If using the Patch?, the LED light will turn off when treatment is complete and the patch can be removed.  Approximate treatment time is 1-3 hours.  Remove the patch when light goes off or after 6 hours. ?The Patch? can be worn during normal activity, however excessive motion where the electrodes have been placed can cause poor contact between the skin and the electrode or uneven electrical current resulting in greater risk of skin irritation. ?Keep out of the reach of children. ? ? ?DO NOT use if you have a cardiac pacemaker or any other electrically sensitive implanted device. ?DO NOT use if you have a known sensitivity to dexamethasone. ?DO NOT use during Magnetic Resonance Imaging (MRI). ?DO NOT use over broken or compromised skin (e.g. sunburn, cuts, or acne) due to the increased risk of skin reaction. ?DO NOT SHAVE over the area to  be treated:  To establish good contact between the Patch? and the skin, excessive hair may be clipped. ?DO NOT place the Patch? or electrodes on or over your eyes, directly over your heart, or brain. ?DO NOT reuse the Patch? or electrodes as this may cause burns to occur.  ? ?Voncille Lo, PT, [place] ?Certified Exercise Expert for the Aging Adult  ?06/19/21 3:33 PM ?Phone: [phone] ?Fax: [phone]  ?

## 2021-06-23 ENCOUNTER — Ambulatory Visit: Payer: BC Managed Care – PPO | Admitting: Internal Medicine

## 2021-06-25 NOTE — Therapy (Signed)
?OUTPATIENT PHYSICAL THERAPY TREATMENT NOTE ? ? ?Patient Name: Gloria Chen ?MRN: [id number] ?DOB:Jan 09, 1961, 61 y.o., female ?Today's Date: 06/26/2021 ? ?PCP: Glendale Chard, MD ?REFERRING PROVIDER: Leandrew Koyanagi, MD ? ? PT End of Session - 06/26/21 1501   ? ? Visit Number 6   ? Number of Visits 8   ? Date for PT Re-Evaluation 07/08/21   ? Authorization Type BCBS/Aetna   ? PT Start Time 1501   ? Activity Tolerance Patient tolerated treatment well   ? Behavior During Therapy [facility] for tasks assessed/performed   ? ?  ?  ? ?  ? ? ? ? ?Past Medical History:  ?Diagnosis Date  ? Allergy   ? Cancer [facility])   ? Constipation   ? Edema, lower extremity   ? Food allergy, peanut   ? Lactose intolerance .  ? Personal history of chemotherapy   ? Pre-diabetes   ? Swelling of right ankle joint   ? Vitamin D deficiency   ? ?Past Surgical History:  ?Procedure Laterality Date  ? BREAST SURGERY    ? MASTECTOMY Left 1997  ? ?Patient Active Problem List  ? Diagnosis Date Noted  ? Right elbow pain 04/07/2021  ? Class 1 obesity due to excess calories without serious comorbidity with body mass index (BMI) of 31.0 to 31.9 in adult 02/16/2019  ? Bronchitis 02/23/2018  ? History of breast cancer in female 10/17/2014  ? ? ?REFERRING DIAG: M25.521 (ICD-10-CM) - Pain in right elbow ? ?THERAPY DIAG:  ?Pain in right elbow ? ?Muscle weakness (generalized) ? ?PERTINENT HISTORY: Pre diabetes, Breast CA remission, ? ?PRECAUTIONS: none ? ?SUBJECTIVE: I have a dull ache today and I feel it all the way to my R thumb ?PAIN:  ?Are you having pain? Yes: NPRS scale: 5/10 ?Pain location: Lateral elbow ?Pain description: dull ache ?Aggravating factors: sleeping/ getting out of bed in the, typing. ?Relieving factors: stretches ? ?At worst at night and waking in morning. 6/10 ? ?OBJECTIVE:  ?  ?DIAGNOSTIC FINDINGS:  ? No acute or structural abnormalities. ?  ?PATIENT SURVEYS:  ?FOTO 72%  predicted 75% ?06-26-21  84% ?  ?COGNITION: ?         Overall cognitive status:  Within functional limits for tasks assessed ?                              ?SENSATION: ?         Light touch: Appears intact ?         Stereognosis: Appears intact ?         Hot/Cold: Appears intact ?         Proprioception: Appears intact ?  ?POSTURE: ?Slight forward head and R >L rounded shld ?  ?UPPER EXTREMITY AROM/PROM: ?  ?A/PROM Right ?05/13/2021 Left ?05/13/2021  ?Shoulder flexion 150 158  ?Shoulder extension      ?Shoulder abduction 147 155  ?Shoulder adduction      ?Shoulder internal rotation 58 60  ?Shoulder external rotation 90 90  ?Elbow flexion 135 140  ?Elbow extension 0 0  ?Wrist flexion 80 90  ?Wrist extension 51 72  ?Wrist ulnar deviation      ?Wrist radial deviation      ?Wrist pronation      ?Wrist supination      ?(Blank rows = not tested) ?  ?UPPER EXTREMITY MMT: ?  ?MMT Right ?05/13/2021 Left ?05/13/2021  ?Shoulder flexion 5 5  ?  Shoulder extension      ?Shoulder abduction 5 5  ?Shoulder adduction      ?Shoulder internal rotation 5 5  ?Shoulder external rotation 5 5  ?Middle trapezius      ?Lower trapezius      ?Elbow flexion      ?Elbow extension      ?Wrist flexion 5 5  ?Wrist extension 4pain 5  ?Wrist ulnar deviation      ?Wrist radial deviation 4pain 5  ?Wrist pronation      ?Wrist supination      ?Grip strength (lbs) 22.6lb 32.3 lb  ?(Blank rows = not tested) ?  ?SHOULDER SPECIAL TESTS: ?NT ?  ?JOINT MOBILITY TESTING:  ?NT ?  ?PALPATION:  ?Marked TTP over R lateral epicondyle ?           ?TODAY'S TREATMENT:  ? ?[facility] Adult PT Treatment:                                                DATE: 06-26-21 ?FOTO 06-26-21  84% ?Therapeutic Exercise: ? ?Yellow flex bar 15 x 3  R wrist extension eccentrically releasing ?Shoulder flexion serratus activation 15 x ?4 lb for wrist extensor eccentric load 2 x 10 ?Supination/pronation 2 x 10 4 lb ?Star pattern (horizontal abd with diagonals x 20 ?Rows 2 x 12 with GTB - cues to avoid hiking the shoulder ?Shoulder IR/ER 2 x 12 with GTB ? ?Wrist extensor stretch 2 x  30 sec ?Manual Therapy: ?MTPR along the proximal common extensors ands supinator ? ?Modalities: ?Iontophoresis patch 1 cc Dexamethasone on left lateral epicondyle and tendon for wrist extensors.  Patch to be removed after 4-to 6 hours ? ?[facility] Adult PT Treatment:                                                DATE: 06-19-21 ? ?Therapeutic Exercise: ? ?UBE L4 FWD/BWD x 2 min ea ?Yellow flex bar 15 x  3  rest 90 seconds R wrist extension eccentrically releasing ?3lb for wrist extensor eccentric load 2 x 10 ?Supination/pronation 2 x 10 ?Rows 2 x 12 with GTB - cues to avoid hiking the shoulder ?Shoulder IR/ER 2 x 12 with GTB ?Scaption 2 x 12 with 3# ?Wrist extensor stretch 2 x 30 sec ?Manual Therapy: ?Friction cross fiber massage and taught Pt to perform on her  ? ?Modalities: ?Iontophoresis patch 1 cc Dexamethasone on left lateral epicondyle and tendon for wrist extensors.  Patch to be removed after 4-to 6 hours ? ?[facility] Adult PT Treatment:                                                DATE: 06/12/2021 ?Therapeutic Exercise: ?UBE L4 FWD/BWD x 2 min ea ?Rows 2 x 12 with GTB - cues to avoid hiking the shoulder ?Shoulder IR/ER 2 x 12 with GTB ?Scaption 2 x 12 with 3# ?Wrist extensor stretch 2 x 30 sec ? ?Updated HEP today  ? ? ?[facility] Adult PT Treatment:  DATE: 05/29/2021 ?Therapeutic Exercise: ?Wrist flexor stretch PNF contract/ relax with 10 sec contraction  ?Wrist extension eccentric loading with GTB in supine 2 x 10 ? ?Reviewed importance of doing the strengthening in combination with the stretch to maximize benefit of PT ?Manual Therapy: ?MWM lateral elbow with gripping grade III ?MTPR along the proximal common extensors ands supinator ?IASTM along the common extensions and tendon ?Trial of pre-wrap anchor strap just distal to the elbow joint ? ?[facility] Adult PT Treatment:                                                DATE: 05/22/21 ?Therapeutic Exercise: ?Seated eccentric wrist  extension 1# 2x12 ?Seated wrist radial deviation 1# 2x12 ?Seated wrist flexion stretch 3x 30 sec ?Resisted finger extension with green  2x10 ?Flexbar supination 2x10 ?Discussed HEP and gentle wrist flexion stretching throughout the day. ?Manual Therapy: ?STW to right common extensor tendon clockwise and counter clockwise ?MTPR to right common extensor tendon attempted, but not tolerated ? ?  ?  ?PATIENT EDUCATION: 05/29/2021 ?Education details: reivewed TPDN and provided handout, iontophoresis information ?Person educated: Patient ?Education method: Explanation, Demonstration, Tactile cues, Verbal cues, and Handouts ?Education comprehension: verbalized understanding, returned demonstration, verbal cues required, and needs further education ?  ?  ?HOME EXERCISE PROGRAM: ?Access Code: [email or web address] ?URL: [email or web address] ?Date: 06/12/2021 ?Prepared by: Starr Lake ? ?Exercises ?- Seated Eccentric Wrist Extension  - 1 x daily - 7 x weekly - 3 sets - 10 reps - 3 sec hold ?- Seated Eccentric Wrist Flexion with Dumbbell  - 1 x daily - 7 x weekly - 3 sets - 10 reps - 3 sec hold ?- Standing Wrist Flexion Stretch  - 2-3 x daily - 7 x weekly - 1 sets - 3 reps - 30 sec hold ?- Standing Wrist Extension Stretch  - 2-3 x daily - 7 x weekly - 1 sets - 3 reps - 30 sec hold ?- Seated Elbow Manual Massage Clockwise  - 1 x daily - 7 x weekly - 3 sets - 10 reps ?- Standing Shoulder Row with Anchored Resistance  - 1 x daily - 7 x weekly - 2 sets - 10 reps ?- Shoulder Internal Rotation  - 1 x daily - 7 x weekly - 2 sets - 10 reps ?- Shoulder External Rotation  - 1 x daily - 7 x weekly - 2 sets - 10 reps ?- Standing Shoulder Scaption  - 1 x daily - 7 x weekly - 2 sets - 10 reps ? ?Patient Education ?- Tennis Elbow ?- Ergonomics for Wrist, Elbow, or Forearm Discomfort ?- Tips for Setting Up Your Home Workstation ? ?Added 06-26-21 ? ? ?- Shoulder Flexion Serratus Activation with Resistance  - 1 x daily - 7 x weekly - 2  sets - 10 reps ?- Standing Shoulder Diagonal Horizontal Abduction 60/120 Degrees with Resistance  - 1 x daily - 7 x weekly - 3 sets - 10 reps ?- Standing Shoulder Horizontal Abduction with Resistance  - 1 x d

## 2021-06-26 ENCOUNTER — Encounter: Payer: Self-pay | Admitting: Physical Therapy

## 2021-06-26 ENCOUNTER — Ambulatory Visit: Payer: BC Managed Care – PPO | Admitting: Physical Therapy

## 2021-06-26 DIAGNOSIS — M6281 Muscle weakness (generalized): Secondary | ICD-10-CM

## 2021-06-26 DIAGNOSIS — M25521 Pain in right elbow: Secondary | ICD-10-CM

## 2021-06-26 NOTE — Patient Instructions (Signed)
IONTOPHORESIS PATIENT PRECAUTIONS & CONTRAINDICATIONS: ? ?Redness under one or both electrodes can occur.  This characterized by a uniform redness that usually disappears within 12 hours of treatment. ?Small pinhead size blisters may result in response to the drug.  Contact your physician if the problem persists more than 24 hours. ?On rare occasions, iontophoresis therapy can result in temporary skin reactions such as rash, inflammation, irritation or burns.  The skin reactions may be the result of individual sensitivity to the ionic solution used, the condition of the skin at the start of treatment, reaction to the materials in the electrodes, allergies or sensitivity to dexamethasone, or a poor connection between the patch and your skin.  Discontinue using iontophoresis if you have any of these reactions and report to your therapist. ?Remove the Patch? or electrodes if you have any undue sensation of pain or burning during the treatment and report discomfort to your therapist. ?Tell your Therapist if you have had known adverse reactions to the application of electrical current. ?If using the Patch?, the LED light will turn off when treatment is complete and the patch can be removed.  Approximate treatment time is 1-3 hours.  Remove the patch when light goes off or after 6 hours. ?The Patch? can be worn during normal activity, however excessive motion where the electrodes have been placed can cause poor contact between the skin and the electrode or uneven electrical current resulting in greater risk of skin irritation. ?Keep out of the reach of children. ? ? ?DO NOT use if you have a cardiac pacemaker or any other electrically sensitive implanted device. ?DO NOT use if you have a known sensitivity to dexamethasone. ?DO NOT use during Magnetic Resonance Imaging (MRI). ?DO NOT use over broken or compromised skin (e.g. sunburn, cuts, or acne) due to the increased risk of skin reaction. ?DO NOT SHAVE over the area to  be treated:  To establish good contact between the Patch? and the skin, excessive hair may be clipped. ?DO NOT place the Patch? or electrodes on or over your eyes, directly over your heart, or brain. ?DO NOT reuse the Patch? or electrodes as this may cause burns to occur.  ?Gloria Chen, PT, [place] ?Certified Exercise Expert for the Aging Adult  ?06/26/21 3:20 PM ?Phone: [phone] ?Fax: [phone]  ?

## 2021-07-02 NOTE — Therapy (Signed)
?OUTPATIENT PHYSICAL THERAPY TREATMENT NOTE ? ? ?Patient Name: Gloria Chen ?MRN: [id number] ?DOB:1960/05/07, 61 y.o., female ?Today's Date: 07/03/2021 ? ?PCP: Glendale Chard, MD ?REFERRING PROVIDER: Leandrew Koyanagi, MD ? ? PT End of Session - 07/03/21 1550   ? ? Visit Number 7   ? Number of Visits 8   ? Date for PT Re-Evaluation 07/08/21   ? Authorization Type [employer]   ? PT Start Time 1545   ? PT Stop Time 1430   ? PT Time Calculation (min) 1365 min   ? Activity Tolerance Patient tolerated treatment well   ? Behavior During Therapy [facility] for tasks assessed/performed   ? ?  ?  ? ?  ? ? ? ? ? ?Past Medical History:  ?Diagnosis Date  ? Allergy   ? Cancer [facility])   ? Constipation   ? Edema, lower extremity   ? Food allergy, peanut   ? Lactose intolerance .  ? Personal history of chemotherapy   ? Pre-diabetes   ? Swelling of right ankle joint   ? Vitamin D deficiency   ? ?Past Surgical History:  ?Procedure Laterality Date  ? BREAST SURGERY    ? MASTECTOMY Left 1997  ? ?Patient Active Problem List  ? Diagnosis Date Noted  ? Right elbow pain 04/07/2021  ? Class 1 obesity due to excess calories without serious comorbidity with body mass index (BMI) of 31.0 to 31.9 in adult 02/16/2019  ? Bronchitis 02/23/2018  ? History of breast cancer in female 10/17/2014  ? ? ?REFERRING DIAG: M25.521 (ICD-10-CM) - Pain in right elbow ? ?THERAPY DIAG:  ?Pain in right elbow ? ?Muscle weakness (generalized) ? ?PERTINENT HISTORY: Pre diabetes, Breast CA remission, ? ?PRECAUTIONS: none ? ?SUBJECTIVE: I am doing OK. About a 2.5/10 (3) ?PAIN:  ?Are you having pain? Yes: NPRS scale: 3/10 ?Pain location: Lateral elbow ?Pain description: dull ache ?Aggravating factors: sleeping/ getting out of bed in the, typing. ?Relieving factors: stretches ? ?At worst at night and waking in morning. 6/10 ? ?OBJECTIVE:  ?  ?DIAGNOSTIC FINDINGS:  ? No acute or structural abnormalities. ?  ?PATIENT SURVEYS:  ?FOTO 72%  predicted 75% ?06-26-21  84% ?  ?COGNITION: ?          Overall cognitive status: Within functional limits for tasks assessed ?                              ?SENSATION: ?         Light touch: Appears intact ?         Stereognosis: Appears intact ?         Hot/Cold: Appears intact ?         Proprioception: Appears intact ?  ?POSTURE: ?Slight forward head and R >L rounded shld ?  ?UPPER EXTREMITY AROM/PROM: ?  ?A/PROM Right ?05/13/2021 Left ?05/13/2021  ?Shoulder flexion 150 158  ?Shoulder extension      ?Shoulder abduction 147 155  ?Shoulder adduction      ?Shoulder internal rotation 58 60  ?Shoulder external rotation 90 90  ?Elbow flexion 135 140  ?Elbow extension 0 0  ?Wrist flexion 80 90  ?Wrist extension 51 72  ?Wrist ulnar deviation      ?Wrist radial deviation      ?Wrist pronation      ?Wrist supination      ?(Blank rows = not tested) ?  ?UPPER EXTREMITY MMT: ?  ?MMT Right ?  05/13/2021 Left ?05/13/2021  ?Shoulder flexion 5 5  ?Shoulder extension      ?Shoulder abduction 5 5  ?Shoulder adduction      ?Shoulder internal rotation 5 5  ?Shoulder external rotation 5 5  ?Middle trapezius      ?Lower trapezius      ?Elbow flexion      ?Elbow extension      ?Wrist flexion 5 5  ?Wrist extension 4pain 5  ?Wrist ulnar deviation      ?Wrist radial deviation 4pain 5  ?Wrist pronation      ?Wrist supination      ?Grip strength (lbs) 22.6lb 32.3 lb  ?(Blank rows = not tested) ?  ?SHOULDER SPECIAL TESTS: ?NT ?  ?JOINT MOBILITY TESTING:  ?NT ?  ?PALPATION:  ?Marked TTP over R lateral epicondyle ?           ?TODAY'S TREATMENT:  ?[facility] Adult PT Treatment:                                                DATE: 07-03-21 ?Therapeutic Exercise: ? ?Yellow flex bar 10 x 1  R wrist extension eccentrically releasing ?Blue flex bar 10 x  and 5 x 1 R wrist extension eccentrically releasing ?Shoulder flexion serratus activation 15 x 3 ?4 lb for wrist extensor eccentric load 2 x 10 ?Supination/pronation 2 x 10 4 lb ?Star pattern (horizontal abd with diagonals x 20) GTB ?Rows 2 x 12 with GTB - cues to  avoid hiking the shoulder doing better this week ?Shoulder IR/ER 2 x 12 with GTB ?Wrist extensor stretch 2 x 30 sec ?Manual Therapy: ?MTPR along the proximal common extensors ands supinator ?IASTYM with cocoa butter ? ?[facility] Adult PT Treatment:                                                DATE: 06-26-21 ?FOTO 06-26-21  84% ?Therapeutic Exercise: ? ?Yellow flex bar 15 x 3  R wrist extension eccentrically releasing ?Shoulder flexion serratus activation 15 x ?4 lb for wrist extensor eccentric load 2 x 10 ?Supination/pronation 2 x 10 4 lb ?Star pattern (horizontal abd with diagonals x 20 ?Rows 2 x 12 with GTB - cues to avoid hiking the shoulder ?Shoulder IR/ER 2 x 12 with GTB ? ?Wrist extensor stretch 2 x 30 sec ?Manual Therapy: ?MTPR along the proximal common extensors ands supinator ? ?Modalities: ?Iontophoresis patch 1 cc Dexamethasone on left lateral epicondyle and tendon for wrist extensors.  Patch to be removed after 4-to 6 hours ? ?[facility] Adult PT Treatment:                                                DATE: 06-19-21 ? ?Therapeutic Exercise: ? ?UBE L4 FWD/BWD x 2 min ea ?Yellow flex bar 15 x  3  rest 90 seconds R wrist extension eccentrically releasing ?3lb for wrist extensor eccentric load 2 x 10 ?Supination/pronation 2 x 10 ?Rows 2 x 12 with GTB - cues to avoid hiking the shoulder ?Shoulder IR/ER 2 x 12 with GTB ?Scaption 2 x 12  with 3# ?Wrist extensor stretch 2 x 30 sec ?Manual Therapy: ?Friction cross fiber massage and taught Pt to perform on her  ? ?Modalities: ?Iontophoresis patch 1 cc Dexamethasone on left lateral epicondyle and tendon for wrist extensors.  Patch to be removed after 4-to 6 hours ? ?[facility] Adult PT Treatment:                                                DATE: 06/12/2021 ?Therapeutic Exercise: ?UBE L4 FWD/BWD x 2 min ea ?Rows 2 x 12 with GTB - cues to avoid hiking the shoulder ?Shoulder IR/ER 2 x 12 with GTB ?Scaption 2 x 12 with 3# ?Wrist extensor stretch 2 x 30 sec ? ?Updated HEP today   ? ? ?[facility] Adult PT Treatment:                                                DATE: 05/29/2021 ?Therapeutic Exercise: ?Wrist flexor stretch PNF contract/ relax with 10 sec contraction  ?Wrist extension eccentric loading with GTB in supine 2 x 10 ? ?Reviewed importance of doing the strengthening in combination with the stretch to maximize benefit of PT ?Manual Therapy: ?MWM lateral elbow with gripping grade III ?MTPR along the proximal common extensors ands supinator ?IASTM along the common extensions and tendon ?Trial of pre-wrap anchor strap just distal to the elbow joint ? ?[facility] Adult PT Treatment:                                                DATE: 05/22/21 ?Therapeutic Exercise: ?Seated eccentric wrist extension 1# 2x12 ?Seated wrist radial deviation 1# 2x12 ?Seated wrist flexion stretch 3x 30 sec ?Resisted finger extension with green  2x10 ?Flexbar supination 2x10 ?Discussed HEP and gentle wrist flexion stretching throughout the day. ?Manual Therapy: ?STW to right common extensor tendon clockwise and counter clockwise ?MTPR to right common extensor tendon attempted, but not tolerated ? ?  ?  ?PATIENT EDUCATION: 05/29/2021 ?Education details: reivewed TPDN and provided handout, iontophoresis information ?Person educated: Patient ?Education method: Explanation, Demonstration, Tactile cues, Verbal cues, and Handouts ?Education comprehension: verbalized understanding, returned demonstration, verbal cues required, and needs further education ?  ?  ?HOME EXERCISE PROGRAM: ?Access Code: [email or web address] ?URL: [email or web address] ?Date: 06/12/2021 ?Prepared by: Starr Lake ? ?Exercises ?- Seated Eccentric Wrist Extension  - 1 x daily - 7 x weekly - 3 sets - 10 reps - 3 sec hold ?- Seated Eccentric Wrist Flexion with Dumbbell  - 1 x daily - 7 x weekly - 3 sets - 10 reps - 3 sec hold ?- Standing Wrist Flexion Stretch  - 2-3 x daily - 7 x weekly - 1 sets - 3 reps - 30 sec hold ?- Standing Wrist Extension  Stretch  - 2-3 x daily - 7 x weekly - 1 sets - 3 reps - 30 sec hold ?- Seated Elbow Manual Massage Clockwise  - 1 x daily - 7 x weekly - 3 sets - 10 reps ?- Standing Shoulder Row with Anchored Resistance  - 1 x d

## 2021-07-03 ENCOUNTER — Ambulatory Visit: Payer: BC Managed Care – PPO | Admitting: Physical Therapy

## 2021-07-03 ENCOUNTER — Encounter: Payer: Self-pay | Admitting: Physical Therapy

## 2021-07-03 DIAGNOSIS — M6281 Muscle weakness (generalized): Secondary | ICD-10-CM

## 2021-07-03 DIAGNOSIS — M25521 Pain in right elbow: Secondary | ICD-10-CM | POA: Diagnosis not present

## 2021-07-07 ENCOUNTER — Ambulatory Visit: Payer: BC Managed Care – PPO | Admitting: Physical Therapy

## 2021-07-07 NOTE — Therapy (Incomplete)
?OUTPATIENT PHYSICAL THERAPY TREATMENT NOTE / RE-ASSESSMENT ? ? ?Patient Name: Gloria Chen ?MRN: [id number] ?DOB:1960/09/13, 61 y.o., female ?Today's Date: 07/07/2021 ? ?PCP: Glendale Chard, MD ?REFERRING PROVIDER: Glendale Chard, MD ? ? ? ? ? ? ? ?Past Medical History:  ?Diagnosis Date  ? Allergy   ? Cancer [facility])   ? Constipation   ? Edema, lower extremity   ? Food allergy, peanut   ? Lactose intolerance .  ? Personal history of chemotherapy   ? Pre-diabetes   ? Swelling of right ankle joint   ? Vitamin D deficiency   ? ?Past Surgical History:  ?Procedure Laterality Date  ? BREAST SURGERY    ? MASTECTOMY Left 1997  ? ?Patient Active Problem List  ? Diagnosis Date Noted  ? Right elbow pain 04/07/2021  ? Class 1 obesity due to excess calories without serious comorbidity with body mass index (BMI) of 31.0 to 31.9 in adult 02/16/2019  ? Bronchitis 02/23/2018  ? History of breast cancer in female 10/17/2014  ? ? ?REFERRING DIAG: M25.521 (ICD-10-CM) - Pain in right elbow ? ?THERAPY DIAG:  ?No diagnosis found. ? ?PERTINENT HISTORY: Pre diabetes, Breast CA remission, ? ?PRECAUTIONS: none ? ?SUBJECTIVE: *** ?PAIN:  ?Are you having pain? Yes: NPRS scale: 3/10 ?Pain location: Lateral elbow ?Pain description: dull ache ?Aggravating factors: sleeping/ getting out of bed in the, typing. ?Relieving factors: stretches ? ?At worst at night and waking in morning. 6/10 ? ?OBJECTIVE:  ?  ?DIAGNOSTIC FINDINGS:  ? No acute or structural abnormalities. ?  ?PATIENT SURVEYS:  ?FOTO 72%  predicted 75% ?06-26-21  84% ?  ?COGNITION: ?         Overall cognitive status: Within functional limits for tasks assessed ?                              ?SENSATION: ?         Light touch: Appears intact ?         Stereognosis: Appears intact ?         Hot/Cold: Appears intact ?         Proprioception: Appears intact ?  ?POSTURE: ?Slight forward head and R >L rounded shld ?  ?UPPER EXTREMITY AROM/PROM: ?  ?A/PROM Right ?05/13/2021 Left ?05/13/2021  ?Shoulder  flexion 150 158  ?Shoulder extension      ?Shoulder abduction 147 155  ?Shoulder adduction      ?Shoulder internal rotation 58 60  ?Shoulder external rotation 90 90  ?Elbow flexion 135 140  ?Elbow extension 0 0  ?Wrist flexion 80 90  ?Wrist extension 51 72  ?Wrist ulnar deviation      ?Wrist radial deviation      ?Wrist pronation      ?Wrist supination      ?(Blank rows = not tested) ?  ?UPPER EXTREMITY MMT: ?  ?MMT Right ?05/13/2021 Left ?05/13/2021  ?Shoulder flexion 5 5  ?Shoulder extension      ?Shoulder abduction 5 5  ?Shoulder adduction      ?Shoulder internal rotation 5 5  ?Shoulder external rotation 5 5  ?Middle trapezius      ?Lower trapezius      ?Elbow flexion      ?Elbow extension      ?Wrist flexion 5 5  ?Wrist extension 4pain 5  ?Wrist ulnar deviation      ?Wrist radial deviation 4pain 5  ?Wrist pronation      ?  Wrist supination      ?Grip strength (lbs) 22.6lb 32.3 lb  ?(Blank rows = not tested) ?  ?SHOULDER SPECIAL TESTS: ?NT ?  ?JOINT MOBILITY TESTING:  ?NT ?  ?PALPATION:  ?Marked TTP over R lateral epicondyle ?           ?TODAY'S TREATMENT:  ? ?[facility] Adult PT Treatment:                                                DATE: 07/07/2021 ?Therapeutic Exercise: ?*** ?Manual Therapy: ?*** ?Neuromuscular re-ed: ?*** ?Therapeutic Activity: ?*** ?Modalities: ?*** ?Self Care: ?*** ? ? ?[facility] Adult PT Treatment:                                                DATE: 07-03-21 ?Therapeutic Exercise: ? ?Yellow flex bar 10 x 1  R wrist extension eccentrically releasing ?Blue flex bar 10 x  and 5 x 1 R wrist extension eccentrically releasing ?Shoulder flexion serratus activation 15 x 3 ?4 lb for wrist extensor eccentric load 2 x 10 ?Supination/pronation 2 x 10 4 lb ?Star pattern (horizontal abd with diagonals x 20) GTB ?Rows 2 x 12 with GTB - cues to avoid hiking the shoulder doing better this week ?Shoulder IR/ER 2 x 12 with GTB ?Wrist extensor stretch 2 x 30 sec ?Manual Therapy: ?MTPR along the proximal common extensors  ands supinator ?IASTYM with cocoa butter ? ?[facility] Adult PT Treatment:                                                DATE: 06-26-21 ?FOTO 06-26-21  84% ?Therapeutic Exercise: ? ?Yellow flex bar 15 x 3  R wrist extension eccentrically releasing ?Shoulder flexion serratus activation 15 x ?4 lb for wrist extensor eccentric load 2 x 10 ?Supination/pronation 2 x 10 4 lb ?Star pattern (horizontal abd with diagonals x 20 ?Rows 2 x 12 with GTB - cues to avoid hiking the shoulder ?Shoulder IR/ER 2 x 12 with GTB ? ?Wrist extensor stretch 2 x 30 sec ?Manual Therapy: ?MTPR along the proximal common extensors ands supinator ? ?Modalities: ?Iontophoresis patch 1 cc Dexamethasone on left lateral epicondyle and tendon for wrist extensors.  Patch to be removed after 4-to 6 hours ?  ?  ?PATIENT EDUCATION: 05/29/2021 ?Education details: reivewed TPDN and provided handout, iontophoresis information ?Person educated: Patient ?Education method: Explanation, Demonstration, Tactile cues, Verbal cues, and Handouts ?Education comprehension: verbalized understanding, returned demonstration, verbal cues required, and needs further education ?  ?  ?HOME EXERCISE PROGRAM: ?Access Code: [email or web address] ?URL: [email or web address] ?Date: 06/12/2021 ?Prepared by: Starr Lake ? ?Exercises ?- Seated Eccentric Wrist Extension  - 1 x daily - 7 x weekly - 3 sets - 10 reps - 3 sec hold ?- Seated Eccentric Wrist Flexion with Dumbbell  - 1 x daily - 7 x weekly - 3 sets - 10 reps - 3 sec hold ?- Standing Wrist Flexion Stretch  - 2-3 x daily - 7 x weekly - 1 sets - 3 reps - 30 sec hold ?- Standing Wrist Extension Stretch  - 2-3  x daily - 7 x weekly - 1 sets - 3 reps - 30 sec hold ?- Seated Elbow Manual Massage Clockwise  - 1 x daily - 7 x weekly - 3 sets - 10 reps ?- Standing Shoulder Row with Anchored Resistance  - 1 x daily - 7 x weekly - 2 sets - 10 reps ?- Shoulder Internal Rotation  - 1 x daily - 7 x weekly - 2 sets - 10 reps ?- Shoulder  External Rotation  - 1 x daily - 7 x weekly - 2 sets - 10 reps ?- Standing Shoulder Scaption  - 1 x daily - 7 x weekly - 2 sets - 10 reps ? ?Patient Education ?- Tennis Elbow ?- Ergonomics for Wrist, Elbow, or Forearm Discomfort ?- Tips for Setting Up Your Home Workstation ? ?Added 06-26-21 ? ? ?- Shoulder Flexion Serratus Activation with Resistance  - 1 x daily - 7 x weekly - 2 sets - 10 reps ?- Standing Shoulder Diagonal Horizontal Abduction 60/120 Degrees with Resistance  - 1 x daily - 7 x weekly - 3 sets - 10 reps ?- Standing Shoulder Horizontal Abduction with Resistance  - 1 x daily - 7 x weekly - 3 sets - 10 reps ? ? ?ASSESSMENT: ?  ?CLINICAL IMPRESSION: ?*** ? ?OBJECTIVE IMPAIRMENTS decreased activity tolerance, decreased strength, increased fascial restrictions, impaired UE functional use, improper body mechanics, and pain.  ?  ?ACTIVITY LIMITATIONS meal prep and occupation.  ?  ?PERSONAL FACTORS Profession are also affecting patient's functional outcome.  ?  ?  ?REHAB POTENTIAL: Excellent ?  ?CLINICAL DECISION MAKING: Stable/uncomplicated ?  ?EVALUATION COMPLEXITY: Low ?  ?  ?GOALS: ?Goals reviewed with patient? Yes ?  ?SHORT TERM GOALS: ?  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  ?LONG TERM GOALS:  ?  ?LTG Name Target Date Goal status  ?1 Pt will improve grip strength at least 7 lb in R hand from eval ?Baseline:R 22.6  L 32.3 lb 07/08/2021 ongoing  ?2 Pt will be able to be able to open jars/drinks without exacerbating pain greater than 2/10 ?Baseline: can be up to 10/10 and unable to open jars 06-26-21 improving ability but still with 5/10 pain  07/08/2021 Partially met ?06-26-21  ?3 Pt will be able to demonstrate carrying groceries 25 lb without exacerbating pain ?Baseline: minimal pain on eval 07/08/2021 ongoing  ?4 Pt will be able to demonstrate a pu

## 2021-07-10 ENCOUNTER — Encounter: Payer: Self-pay | Admitting: Physical Therapy

## 2021-07-10 ENCOUNTER — Ambulatory Visit: Payer: BC Managed Care – PPO | Admitting: Physical Therapy

## 2021-07-10 DIAGNOSIS — M25521 Pain in right elbow: Secondary | ICD-10-CM | POA: Diagnosis not present

## 2021-07-10 DIAGNOSIS — M6281 Muscle weakness (generalized): Secondary | ICD-10-CM

## 2021-07-10 NOTE — Therapy (Signed)
?OUTPATIENT PHYSICAL THERAPY TREATMENT NOTE / DISCHARGE ? ? ?Patient Name: Gloria Chen ?MRN: [id number] ?DOB:1960-07-16, 61 y.o., female ?Today's Date: 07/10/2021 ? ?PCP: Glendale Chard, MD ?REFERRING PROVIDER: Frankey Shown MD ? PT End of Session - 07/10/21 1504   ? ? Visit Number 8   ? Number of Visits 8   ? Date for PT Re-Evaluation 07/10/21   ? PT Start Time 1505   ? PT Stop Time 1540   ? PT Time Calculation (min) 35 min   ? Activity Tolerance Patient tolerated treatment well   ? Behavior During Therapy [facility] for tasks assessed/performed   ? ?  ?  ? ?  ? ? ? ? ? ? ?Past Medical History:  ?Diagnosis Date  ? Allergy   ? Cancer [facility])   ? Constipation   ? Edema, lower extremity   ? Food allergy, peanut   ? Lactose intolerance .  ? Personal history of chemotherapy   ? Pre-diabetes   ? Swelling of right ankle joint   ? Vitamin D deficiency   ? ?Past Surgical History:  ?Procedure Laterality Date  ? BREAST SURGERY    ? MASTECTOMY Left 1997  ? ?Patient Active Problem List  ? Diagnosis Date Noted  ? Right elbow pain 04/07/2021  ? Class 1 obesity due to excess calories without serious comorbidity with body mass index (BMI) of 31.0 to 31.9 in adult 02/16/2019  ? Bronchitis 02/23/2018  ? History of breast cancer in female 10/17/2014  ? ? ?REFERRING DIAG: M25.521 (ICD-10-CM) - Pain in right elbow ? ?THERAPY DIAG:  ?Pain in right elbow ? ?Muscle weakness (generalized) ? ?PERTINENT HISTORY: Pre diabetes, Breast CA remission, ? ?PRECAUTIONS: none ? ?SUBJECTIVE: "things generally have been going okay, I've had a rough night in the elbow, it did calm down after I got up and moved around." ? ?PAIN:  ?Are you having pain? Yes: NPRS scale: 4/10 ?Pain location: Lateral elbow ?Pain description: dull ache ?Aggravating factors: sleeping/ getting out of bed in the, typing. ?Relieving factors: stretches ? ?At worst at night and waking in morning. 6/10 ? ?OBJECTIVE:  ?  ?DIAGNOSTIC FINDINGS:  ? No acute or structural abnormalities. ?  ?PATIENT  SURVEYS:  ?FOTO 72%  predicted 75% ?06-26-21  84% ?  ?COGNITION: ?         Overall cognitive status: Within functional limits for tasks assessed ?                              ?SENSATION: ?         Light touch: Appears intact ?         Stereognosis: Appears intact ?         Hot/Cold: Appears intact ?         Proprioception: Appears intact ?  ?POSTURE: ?Slight forward head and R >L rounded shld ?  ?UPPER EXTREMITY AROM/PROM: ?  ?A/PROM Right ?05/13/2021 Left ?05/13/2021  ?Shoulder flexion 150 158  ?Shoulder extension      ?Shoulder abduction 147 155  ?Shoulder adduction      ?Shoulder internal rotation 58 60  ?Shoulder external rotation 90 90  ?Elbow flexion 135 140  ?Elbow extension 0 0  ?Wrist flexion 80 90  ?Wrist extension 51 72  ?Wrist ulnar deviation      ?Wrist radial deviation      ?Wrist pronation      ?Wrist supination      ?(Blank  rows = not tested) ?  ?UPPER EXTREMITY MMT: ?  ?MMT Right ?05/13/2021 Left ?05/13/2021 Right ?427/2023  ?Shoulder flexion 5 5   ?Shoulder extension       ?Shoulder abduction 5 5   ?Shoulder adduction       ?Shoulder internal rotation 5 5   ?Shoulder external rotation 5 5   ?Middle trapezius       ?Lower trapezius       ?Elbow flexion       ?Elbow extension       ?Wrist flexion 5 5   ?Wrist extension 4pain 5   ?Wrist ulnar deviation       ?Wrist radial deviation 4pain 5   ?Wrist pronation       ?Wrist supination       ?Grip strength (lbs) 22.6lb 32.3 lb 51, 49,42= AVG 47.3  ?(Blank rows = not tested) ?  ?SHOULDER SPECIAL TESTS: ?NT ?  ?JOINT MOBILITY TESTING:  ?NT ?  ?PALPATION:  ?Marked TTP over R lateral epicondyle ?           ?TODAY'S TREATMENT:  ? ?[place] Adult PT Treatment:                                                DATE: 07/07/2021 ?Therapeutic Exercise: ?Extensively reviewed her HEP and discussed how to properly progress strength/ endurance with increased reps, sets and resistance.  ? ?Self Care: ?Reviewed anatomy of the elbow and effect of the muscles when utilizing grip and  other activities.  ? ? ?[facility] Adult PT Treatment:                                                DATE: 07-03-21 ?Therapeutic Exercise: ? ?Yellow flex bar 10 x 1  R wrist extension eccentrically releasing ?Blue flex bar 10 x  and 5 x 1 R wrist extension eccentrically releasing ?Shoulder flexion serratus activation 15 x 3 ?4 lb for wrist extensor eccentric load 2 x 10 ?Supination/pronation 2 x 10 4 lb ?Star pattern (horizontal abd with diagonals x 20) GTB ?Rows 2 x 12 with GTB - cues to avoid hiking the shoulder doing better this week ?Shoulder IR/ER 2 x 12 with GTB ?Wrist extensor stretch 2 x 30 sec ?Manual Therapy: ?MTPR along the proximal common extensors ands supinator ?IASTYM with cocoa butter ? ?[facility] Adult PT Treatment:                                                DATE: 06-26-21 ?FOTO 06-26-21  84% ?Therapeutic Exercise: ? ?Yellow flex bar 15 x 3  R wrist extension eccentrically releasing ?Shoulder flexion serratus activation 15 x ?4 lb for wrist extensor eccentric load 2 x 10 ?Supination/pronation 2 x 10 4 lb ?Star pattern (horizontal abd with diagonals x 20 ?Rows 2 x 12 with GTB - cues to avoid hiking the shoulder ?Shoulder IR/ER 2 x 12 with GTB ? ?Wrist extensor stretch 2 x 30 sec ?Manual Therapy: ?MTPR along the proximal common extensors ands supinator ? ?Modalities: ?Iontophoresis patch 1 cc Dexamethasone on left lateral epicondyle  and tendon for wrist extensors.  Patch to be removed after 4-to 6 hours ?  ?  ?PATIENT EDUCATION: 05/29/2021 ?Education details: reivewed TPDN and provided handout, iontophoresis information ?Person educated: Patient ?Education method: Explanation, Demonstration, Tactile cues, Verbal cues, and Handouts ?Education comprehension: verbalized understanding, returned demonstration, verbal cues required, and needs further education ?  ?  ?HOME EXERCISE PROGRAM: ?Access Code: [email or web address] ?URL: [email or web address] ?Date: 06/12/2021 ?Prepared by: Starr Lake ? ?Exercises ?- Seated Eccentric Wrist Extension  - 1 x daily - 7 x weekly - 3 sets - 10 reps - 3 sec hold ?- Seated Eccentric Wrist Flexion with Dumbbell  - 1 x daily - 7 x weekly - 3 sets - 10 reps - 3 sec hold ?- Standing Wrist Flexion Stretch  - 2-3 x daily - 7 x weekly - 1 sets - 3 reps - 30 sec hold ?- Standing Wrist Extension Stretch  - 2-3 x daily - 7 x weekly - 1 sets - 3 reps - 30 sec hold ?- Seated Elbow Manual Massage Clockwise  - 1 x daily - 7 x weekly - 3 sets - 10 reps ?- Standing Shoulder Row with Anchored Resistance  - 1 x daily - 7 x weekly - 2 sets - 10 reps ?- Shoulder Internal Rotation  - 1 x daily - 7 x weekly - 2 sets - 10 reps ?- Shoulder External Rotation  - 1 x daily - 7 x weekly - 2 sets - 10 reps ?- Standing Shoulder Scaption  - 1 x daily - 7 x weekly - 2 sets - 10 reps ? ?Patient Education ?- Tennis Elbow ?- Ergonomics for Wrist, Elbow, or Forearm Discomfort ?- Tips for Setting Up Your Home Workstation ? ?Added 06-26-21 ? ? ?- Shoulder Flexion Serratus Activation with Resistance  - 1 x daily - 7 x weekly - 2 sets - 10 reps ?- Standing Shoulder Diagonal Horizontal Abduction 60/120 Degrees with Resistance  - 1 x daily - 7 x weekly - 3 sets - 10 reps ?- Standing Shoulder Horizontal Abduction with Resistance  - 1 x daily - 7 x weekly - 3 sets - 10 reps ? ? ?ASSESSMENT: ?  ?CLINICAL IMPRESSION: ?Mrs Reede has made excellent progress with physical therapy and has increased her grip strength and additionally reports minimal to no pain in the lateral aspect of the elbow. She has met all goals and noted reduced soreness in the elbow following her reassessment. Extensively reviewed her HEP and discussed progression of strengthening. At this point pt is able to maintain and progress her current LOF IND and will be formally discharged from PT today.  ? ?OBJECTIVE IMPAIRMENTS decreased activity tolerance, decreased strength, increased fascial restrictions, impaired UE functional use, improper  body mechanics, and pain.  ?  ?ACTIVITY LIMITATIONS meal prep and occupation.  ?  ?PERSONAL FACTORS Profession are also affecting patient's functional outcome.  ?  ?  ?REHAB POTENTIAL: Excellent ?  ?CLINICAL DECISION MAKING:

## 2021-07-18 ENCOUNTER — Other Ambulatory Visit: Payer: Self-pay | Admitting: Nurse Practitioner

## 2021-07-18 DIAGNOSIS — R0981 Nasal congestion: Secondary | ICD-10-CM

## 2021-07-30 ENCOUNTER — Ambulatory Visit: Payer: BC Managed Care – PPO | Admitting: Internal Medicine

## 2021-10-06 ENCOUNTER — Encounter: Payer: BC Managed Care – PPO | Admitting: Internal Medicine

## 2021-10-06 ENCOUNTER — Other Ambulatory Visit: Payer: Self-pay | Admitting: Internal Medicine

## 2021-10-06 ENCOUNTER — Other Ambulatory Visit: Payer: BC Managed Care – PPO

## 2021-10-06 DIAGNOSIS — R7309 Other abnormal glucose: Secondary | ICD-10-CM

## 2021-10-06 NOTE — Progress Notes (Deleted)
  Rich Brave Llittleton,acting as a Education administrator for Maximino Greenland, MD.,have documented all relevant documentation on the behalf of Maximino Greenland, MD,as directed by  Maximino Greenland, MD while in the presence of Maximino Greenland, MD.    Subjective:     Patient ID: Gloria Chen , female    DOB: 1960/11/15 , 61 y.o.   MRN: [id number]   Chief Complaint  Patient presents with   Prediabetes   Hyperlipidemia    HPI  Patient presents today for a f/u on her prediabetes and chol recheck.    Hyperlipidemia     Past Medical History:  Diagnosis Date   Allergy    Cancer ([place])    Constipation    Edema, lower extremity    Food allergy, peanut    Lactose intolerance .   Personal history of chemotherapy    Pre-diabetes    Swelling of right ankle joint    Vitamin D deficiency      Family History  Problem Relation Age of Onset   Hypertension Mother    Thyroid disease Mother    Heart disease Father    Hyperlipidemia Father    Hypertension Father    Stroke Father    Diabetes Father    Hypertension Sister    Diabetes Sister    Hypertension Brother    Breast cancer Maternal Aunt      Current Outpatient Medications:    benzonatate (TESSALON PERLES) 100 MG capsule, Take 1 capsule (100 mg total) by mouth every 6 (six) hours as needed for cough. (Patient not taking: Reported on 05/13/2021), Disp: 30 capsule, Rfl: 1   Cholecalciferol (VITAMIN D) 50 MCG (2000 UT) tablet, Take 1 tablet (2,000 Units total) by mouth daily. (Patient not taking: Reported on 05/13/2021), Disp: 30 tablet, Rfl: 0   fluticasone (FLONASE) 50 MCG/ACT nasal spray, SPRAY 2 SPRAYS INTO EACH NOSTRIL EVERY DAY, Disp: 48 mL, Rfl: 1   Allergies  Allergen Reactions   Almond Oil    Peanut-Containing Drug Products    Soy Allergy    Wheat Bran      Review of Systems  Constitutional: Negative.   Eyes: Negative.   Musculoskeletal: Negative.   Skin: Negative.   Psychiatric/Behavioral: Negative.       There were no vitals  filed for this visit. There is no height or weight on file to calculate BMI.   Objective:  Physical Exam      Assessment And Plan:     There are no diagnoses linked to this encounter.    Patient was given opportunity to ask questions. Patient verbalized understanding of the plan and was able to repeat key elements of the plan. All questions were answered to their satisfaction.  Sheppard Evens Llittleton, CMA   I, [place], CMA, have reviewed all documentation for this visit. The documentation on 10/06/21 for the exam, diagnosis, procedures, and orders are all accurate and complete.   IF YOU HAVE BEEN REFERRED TO A SPECIALIST, IT MAY TAKE 1-2 WEEKS TO SCHEDULE/PROCESS THE REFERRAL. IF YOU HAVE NOT HEARD FROM US/SPECIALIST IN TWO WEEKS, PLEASE GIVE [place] A CALL AT [phone].   THE PATIENT IS ENCOURAGED TO PRACTICE SOCIAL DISTANCING DUE TO THE COVID-19 PANDEMIC.

## 2021-10-07 LAB — LIPID PANEL
Chol/HDL Ratio: 3.1 ratio (ref 0.0–4.4)
Cholesterol, Total: 202 mg/dL — ABNORMAL HIGH (ref 100–199)
HDL: 65 mg/dL (ref >39)
LDL Chol Calc (NIH): 121 mg/dL — ABNORMAL HIGH (ref 0–99)
Triglycerides: 89 mg/dL (ref 0–149)
VLDL Cholesterol Cal: 16 mg/dL (ref 5–40)

## 2021-10-07 LAB — HEMOGLOBIN A1C
Est. average glucose Bld gHb Est-mCnc: 123 mg/dL
Hgb A1c MFr Bld: 5.9 % — ABNORMAL HIGH (ref 4.8–5.6)

## 2021-10-11 ENCOUNTER — Encounter: Payer: Self-pay | Admitting: Internal Medicine

## 2021-10-11 LAB — COMPREHENSIVE METABOLIC PANEL
ALT: 21 IU/L (ref 0–32)
AST: 14 IU/L (ref 0–40)
Albumin/Globulin Ratio: 1.5 (ref 1.2–2.2)
Albumin: 4.8 g/dL (ref 3.8–4.9)
Alkaline Phosphatase: 81 IU/L (ref 44–121)
BUN/Creatinine Ratio: 16 (ref 9–23)
BUN: 10 mg/dL (ref 6–24)
Bilirubin Total: <0.2 mg/dL (ref 0.0–1.2)
CO2: 18 mmol/L — ABNORMAL LOW (ref 20–29)
Calcium: 9.8 mg/dL (ref 8.7–10.2)
Chloride: 108 mmol/L — ABNORMAL HIGH (ref 96–106)
Creatinine, Ser: 0.62 mg/dL (ref 0.57–1.00)
Globulin, Total: 3.1 g/dL (ref 1.5–4.5)
Glucose: 123 mg/dL — ABNORMAL HIGH (ref 70–99)
Potassium: 4.6 mmol/L (ref 3.5–5.2)
Sodium: 150 mmol/L — ABNORMAL HIGH (ref 134–144)
Total Protein: 7.9 g/dL (ref 6.0–8.5)
eGFR: 103 mL/min/{1.73_m2} (ref >59)

## 2021-10-11 LAB — SPECIMEN STATUS REPORT

## 2021-10-13 NOTE — Progress Notes (Signed)
Cancelled-erroneous

## 2021-10-15 ENCOUNTER — Encounter: Payer: Self-pay | Admitting: Internal Medicine

## 2021-10-15 ENCOUNTER — Ambulatory Visit (INDEPENDENT_AMBULATORY_CARE_PROVIDER_SITE_OTHER): Payer: BC Managed Care – PPO | Admitting: Internal Medicine

## 2021-10-15 VITALS — BP 124/80 | HR 75 | Temp 97.9°F | Ht 64.2 in | Wt 177.8 lb

## 2021-10-15 DIAGNOSIS — E6609 Other obesity due to excess calories: Secondary | ICD-10-CM | POA: Diagnosis not present

## 2021-10-15 DIAGNOSIS — R3915 Urgency of urination: Secondary | ICD-10-CM | POA: Diagnosis not present

## 2021-10-15 DIAGNOSIS — R7303 Prediabetes: Secondary | ICD-10-CM

## 2021-10-15 DIAGNOSIS — E870 Hyperosmolality and hypernatremia: Secondary | ICD-10-CM

## 2021-10-15 DIAGNOSIS — Z6830 Body mass index (BMI) 30.0-30.9, adult: Secondary | ICD-10-CM

## 2021-10-15 NOTE — Patient Instructions (Signed)

## 2021-10-15 NOTE — Progress Notes (Signed)
Rich Brave Llittleton,acting as a Education administrator for Maximino Greenland, MD.,have documented all relevant documentation on the behalf of Maximino Greenland, MD,as directed by  Maximino Greenland, MD while in the presence of Maximino Greenland, MD.    Subjective:     Patient ID: Gloria Chen , female    DOB: Oct 30, 1960 , 61 y.o.   MRN: [id number]   Chief Complaint  Patient presents with   Prediabetes    HPI  Patient presents today for a f/u on her prediabetes and for f/u of elevated sodium levels. She states she has been more intentional about exercising and improving her eating habits. She admits to having a deli sandwich the night before her last lab visit.       Past Medical History:  Diagnosis Date   Allergy    Cancer ([place])    Constipation    Edema, lower extremity    Food allergy, peanut    Lactose intolerance .   Personal history of chemotherapy    Pre-diabetes    Swelling of right ankle joint    Vitamin D deficiency      Family History  Problem Relation Age of Onset   Hypertension Mother    Thyroid disease Mother    Heart disease Father    Hyperlipidemia Father    Hypertension Father    Stroke Father    Diabetes Father    Hypertension Sister    Diabetes Sister    Hypertension Brother    Breast cancer Maternal Aunt      Current Outpatient Medications:    fluticasone (FLONASE) 50 MCG/ACT nasal spray, SPRAY 2 SPRAYS INTO EACH NOSTRIL EVERY DAY, Disp: 48 mL, Rfl: 1   benzonatate (TESSALON PERLES) 100 MG capsule, Take 1 capsule (100 mg total) by mouth every 6 (six) hours as needed for cough. (Patient not taking: Reported on 05/13/2021), Disp: 30 capsule, Rfl: 1   Cholecalciferol (VITAMIN D) 50 MCG (2000 UT) tablet, Take 1 tablet (2,000 Units total) by mouth daily. (Patient not taking: Reported on 10/15/2021), Disp: 30 tablet, Rfl: 0   Allergies  Allergen Reactions   Almond Oil    Peanut-Containing Drug Products    Soy Allergy    Wheat Bran      Review of Systems   Constitutional: Negative.   Respiratory: Negative.    Cardiovascular: Negative.   Gastrointestinal: Negative.   Genitourinary:  Positive for urgency.  Neurological: Negative.   Psychiatric/Behavioral: Negative.       Today's Vitals   10/15/21 1105  BP: 124/80  Pulse: 75  Temp: 97.9 F (36.6 C)  Weight: 177 lb 12.8 oz (80.6 kg)  Height: 5' 4.2" (1.631 m)  PainSc: 0-No pain   Body mass index is 30.33 kg/m.  Wt Readings from Last 3 Encounters:  10/15/21 177 lb 12.8 oz (80.6 kg)  04/07/21 177 lb 9.6 oz (80.6 kg)  02/18/21 178 lb 9.6 oz (81 kg)     Objective:  Physical Exam Vitals and nursing note reviewed.  Constitutional:      Appearance: Normal appearance.  HENT:     Head: Normocephalic and atraumatic.  Eyes:     Extraocular Movements: Extraocular movements intact.  Cardiovascular:     Rate and Rhythm: Normal rate and regular rhythm.     Heart sounds: Normal heart sounds.  Pulmonary:     Effort: Pulmonary effort is normal.     Breath sounds: Normal breath sounds.  Musculoskeletal:     Cervical back: Normal  range of motion.  Skin:    General: Skin is warm.  Neurological:     General: No focal deficit present.     Mental Status: She is alert.  Psychiatric:        Mood and Affect: Mood normal.        Behavior: Behavior normal.         Assessment And Plan:     1. Prediabetes Comments: July 2023 a1c, 5.9. She was congratulated on her dietary changes and encouraged to keep up the great work.   2. Hypernatremia Comments: Sodium was 150 last week. She has since increased her water intake. I will recheck a BMP today.  - BMP8+eGFR  3. Urinary urgency Comments: We discussed the use of pelvic floor exercises to help with these sx. If persistent, I will refer her to PT for further instruction w/ these exercises.  4. Class 1 obesity due to excess calories with body mass index (BMI) of 30.0 to 30.9 in adult, unspecified whether serious comorbidity  present Comments: She is encouraged to aim for at least 150 minutes of exercise per week.     Patient was given opportunity to ask questions. Patient verbalized understanding of the plan and was able to repeat key elements of the plan. All questions were answered to their satisfaction.   I, Maximino Greenland, MD, have reviewed all documentation for this visit. The documentation on 10/15/21 for the exam, diagnosis, procedures, and orders are all accurate and complete.   IF YOU HAVE BEEN REFERRED TO A SPECIALIST, IT MAY TAKE 1-2 WEEKS TO SCHEDULE/PROCESS THE REFERRAL. IF YOU HAVE NOT HEARD FROM US/SPECIALIST IN TWO WEEKS, PLEASE GIVE [place] A CALL AT [phone].   THE PATIENT IS ENCOURAGED TO PRACTICE SOCIAL DISTANCING DUE TO THE COVID-19 PANDEMIC.

## 2021-10-16 LAB — BMP8+EGFR
BUN/Creatinine Ratio: 19 (ref 9–23)
BUN: 11 mg/dL (ref 6–24)
CO2: 22 mmol/L (ref 20–29)
Calcium: 10.3 mg/dL — ABNORMAL HIGH (ref 8.7–10.2)
Chloride: 104 mmol/L (ref 96–106)
Creatinine, Ser: 0.58 mg/dL (ref 0.57–1.00)
Glucose: 108 mg/dL — ABNORMAL HIGH (ref 70–99)
Potassium: 4.9 mmol/L (ref 3.5–5.2)
Sodium: 145 mmol/L — ABNORMAL HIGH (ref 134–144)
eGFR: 104 mL/min/{1.73_m2} (ref >59)

## 2021-10-17 ENCOUNTER — Encounter: Payer: Self-pay | Admitting: Internal Medicine

## 2021-10-21 ENCOUNTER — Other Ambulatory Visit: Payer: Self-pay

## 2021-10-21 DIAGNOSIS — E870 Hyperosmolality and hypernatremia: Secondary | ICD-10-CM

## 2021-10-22 ENCOUNTER — Encounter (INDEPENDENT_AMBULATORY_CARE_PROVIDER_SITE_OTHER): Payer: Self-pay

## 2021-11-18 ENCOUNTER — Other Ambulatory Visit: Payer: BC Managed Care – PPO

## 2021-12-08 ENCOUNTER — Other Ambulatory Visit: Payer: BC Managed Care – PPO

## 2021-12-08 DIAGNOSIS — E870 Hyperosmolality and hypernatremia: Secondary | ICD-10-CM

## 2021-12-08 LAB — BMP8+EGFR
BUN/Creatinine Ratio: 20 (ref 9–23)
BUN: 13 mg/dL (ref 6–24)
CO2: 21 mmol/L (ref 20–29)
Calcium: 10 mg/dL (ref 8.7–10.2)
Chloride: 104 mmol/L (ref 96–106)
Creatinine, Ser: 0.65 mg/dL (ref 0.57–1.00)
Glucose: 120 mg/dL — ABNORMAL HIGH (ref 70–99)
Potassium: 4.5 mmol/L (ref 3.5–5.2)
Sodium: 140 mmol/L (ref 134–144)
eGFR: 101 mL/min/{1.73_m2} (ref >59)

## 2022-02-02 LAB — HM MAMMOGRAPHY

## 2022-02-16 ENCOUNTER — Ambulatory Visit (INDEPENDENT_AMBULATORY_CARE_PROVIDER_SITE_OTHER): Payer: BC Managed Care – PPO | Admitting: Internal Medicine

## 2022-02-16 ENCOUNTER — Encounter: Payer: Self-pay | Admitting: Internal Medicine

## 2022-02-16 VITALS — BP 120/84 | HR 91 | Ht 64.2 in | Wt 178.6 lb

## 2022-02-16 DIAGNOSIS — E042 Nontoxic multinodular goiter: Secondary | ICD-10-CM | POA: Insufficient documentation

## 2022-02-16 NOTE — Patient Instructions (Signed)
Please schedule the new ultrasound.  Please come back for a follow-up appointment in 1 year.

## 2022-02-16 NOTE — Progress Notes (Signed)
Patient ID: Gloria Chen, female   DOB: 01-01-1961, 61 y.o.   MRN: [id number]  HPI  Gloria Chen is a 61 y.o.-year-old female, initially referred by her PCP, Dr. Baird Cancer, returning for follow-up for thyroid nodules.  Last visit 1 year ago.  Interim history: She feels well since last visit without complaints today. No neck discomfort or problems swallowing.  Reviewed and addended history: Patient has a long history of multiple thyroid nodules - found by palpation by her ObGyn provider in the past.  She was found to have fullness in her neck again on palpation this year (Dr. Everett Graff) and then saw PCP (Dr. Glendale Chard) >> a new thyroid U/S was ordered >> she had multiple small thyroid nodules but also a new, larger, dominant nodule >> Bx: inconclusive. She was referred to endocrinology.  Thyroid U/S (11/14/2003):  Ultrasound of the thyroid gland was performed.  The right lobe measures 6.0 x 1.9 x 2.3 cm.  The left lobe measures 5.8 x 1.5 x 1.6 cm.  Thyroid isthmus is 4-5 mm in thickness.    Overall the thyroid parenchymal echotexture is homogeneous.  There are three discrete noncystic lesions identified, one in the lateral aspect of the right lobe measuring 9 mm, a more central lesion in the medial aspect of the right lobe measuring 8 mm, and a medial left lobe hypoechoic nodule measuring 7 mm.    IMPRESSION  Three tiny hypoechoic nodules in the thyroid parenchyma are most likely benign, but ultrasound features are nonspecific. A followup ultrasound in three to six months may be helpful to ensure that they remain stable.   Thyroid U/S (03/24/2004): The right thyroid lobe measures 5.5 x 1.8 x 2.0  The left lobe measures 5.2 x 1.4 x 1.5 cm.  The isthmus measures .36 cm.  The overall echogenicity of the thyroid gland is homogeneous.  There are multiple bilateral thyroid nodules. These appear stable. No dominant worrisome nodule is seen.    IMPRESSION:  1. Multinodular goiter. No  change in multiple small thyroid nodules. Recommend sonographic surveillance with a followup study in one year.    Thyroid U/S (12/22/2006): The right thyroid lobe measures 4.4 x 1.7 x 1.7 cm.   The left lobe measures 4.8 x 1.4 x 1.6 cm.   The isthmus measures 3 mm in thickness.   The patient has multiple bilateral thyroid nodules ranging in size from about 4 mm  to 9 mm. There is no substantial interval change.  No definite progressive  intrathyroid nodule is identified.     Cranial to the right thyroid lobe, the sonographer has identified a 2.2 x 1.1 cm  complex cystic lesion which demonstrates peripheral blood flow.     IMPRESSION:  Numerous bilateral thyroid nodules compatible with multinodular goiter. There is  no substantial interval change in appearance of the thyroid gland.     New 2.2 cm complex cystic lesion in the soft tissues just cranial to the right  thyroid lobe near the midline. A necrotic lymph node could have this appearance.  Thyroglossal duct cyst would also be a consideration. Clinical correlation is  recommended and CT neck with contrast is suggested to further evaluate.   CT neck (01/17/2007):  There are findings of a multinodular goiter.  Anterior to the superior pole of the right thyroid lobe, there is a 0.6 x 1.4 x 1.6 cm focus which is compatible with a pyramidal lobe of the thyroid gland.  Within the pyramidal lobe, there is  a 3 x 8 x 10 mm fluid-filled focus which I feel represents the residua of the mass noted on ultrasound.  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  The small low density focus is circumscribed by tissue which has an identical appearance to the thyroid gland.  There is a small linear extension off of the pyramidal lobe extending superiorly towards the hyoid bone.  The focus is causing focal anterior displacement of the right strap muscles.  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   Thyroid U/S (01/15/2021): Parenchymal Echotexture: Moderately heterogenous  Isthmus: Normal in size measuring 0.5 cm in diameter  Right lobe: Borderline enlarged measuring 5.2 x 2.0 x 2.1 cm, previously, 4.4 x 1.7 x 1.7  Left lobe: Normal in size measuring 4.8 x 1.4 x 1.6 cm, previously, 4.8 x 1.4 x 1.6 cm _________________________________________________________   Estimated total number of nodules >/= 1 cm: 6-10 _________________________________________________________   There is an approximately 1.1 x 0.9 x 0.4 cm hypoechoic nodule within mid aspect of the right lobe of the thyroid (labeled 1), which is unchanged compared to the 12/2006 examination, previously, 1.0 cm. Imaging stability for greater than 5 years is indicative benign etiology. _________________________________________________________   Nodule # 2:  Location: Right; Mid (not seen on the 12/2006 examination)  Maximum size: 1.9 cm; Other 2 dimensions: 1.3 x 1.0 cm  Composition: solid/almost completely solid (2)  Echogenicity: hypoechoic (2)  **Given size (>/= 1.5 cm) and appearance, fine needle aspiration of this moderately suspicious nodule should be considered based on TI-RADS criteria.  _________________________________________________________   There is an approximately 1.5 x 1.0 x 0.7 cm anechoic cyst within mid aspect the right lobe of the thyroid (labeled 3) which has reduced in size compared to the 2008 examination, previously, 2.2 x 1.7 x 1.1 cm, and again does not meet criteria to recommend percutaneous sampling or continued dedicated follow-up.   There is an approximately 1.2 x 1.0 x 0.4 cm spongiform/benign-appearing nodule within the mid, medial aspect of the right lobe of the thyroid (labeled 4), which is unchanged to slightly increased  in size compared to the 2008 examination, previously, 0.8 cm, however again does not meet criteria to recommend percutaneous sampling or continued dedicated follow-up.  _________________________________________________________   There is an approximately 1.2 x 1 x 1 x 0.7 cm spongiform/benign-appearing nodule within the superior pole of the left lobe of the thyroid (labeled 5) which has increased in size compared to the 12/2006 examination for previously, 0.6 cm, however does not meet criteria to recommend percutaneous sampling or continued dedicated follow-up given its spongiform/benign appearance.   There is a punctate (approximately 0.7 cm spongiform/benign-appearing nodule within mid aspect the left lobe of the thyroid (labeled 6), which does not meet criteria to recommend percutaneous sampling or continued dedicated follow-up.   There is an approximately 1.2 x 1.0 x 0.6 cm spongiform/benign-appearing nodule within the mid aspect the left lobe of the thyroid (labeled 7), which has increased in size compared to the 12/2006 examination heart previously, 0.6 cm, however does not meet criteria to recommend percutaneous sampling or continued dedicated follow-up given its spongiform/benign appearance.   There is an approximately 0.7 cm spongiform/benign-appearing nodule within the inferior pole the left lobe of the thyroid (labeled 8), not  seen on the 2008 examination though does not meet criteria to recommend percutaneous sampling or continued dedicated follow-up.   IMPRESSION: 1. Borderline thyromegaly with findings suggestive of multinodular goiter. 2. Nodule #2, not seen on the 2008 examination, meets imaging criteria to recommend percutaneous sampling as indicated. 3. None of the remaining thyroid nodules, the majority of which appear spongiform/benign meet imaging criteria to recommend percutaneous sampling or continued dedicated follow-up.  FNA of the right dominant  thyroid nodule (02/11/2021): A. 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 (TC:tc)  Smears:6  Concentration Method (ThinPrep): 1  Cell Block: Cell block attempted but not obtained.  Additional Studies: Afirma Collected.   Afirma results returned benign (risk of malignancy approximately 4%).    Pt denies: - feeling nodules in neck - hoarseness - dysphagia - choking  I reviewed pt's thyroid tests - normal: Lab Results  Component Value Date   TSH 0.75 02/18/2021   TSH 1.260 02/01/2019   FREET4 1.47 02/01/2019    + FH of thyroid ds. - in mother: on LT4 (? ThyCA). No h/o radiation tx to head or neck. No steroid use. No herbal supplements. No Biotin supplements or Hair, Skin and Nails vitamins.   Pt also has a history of BrCA - in remission, prediabetes.  ROS: + See HPI  Past Medical History:  Diagnosis Date   Allergy    Cancer ([place])    Constipation    Edema, lower extremity    Food allergy, peanut    Lactose intolerance .   Personal history of chemotherapy    Pre-diabetes    Swelling of right ankle joint    Vitamin D deficiency    Past Surgical History:  Procedure Laterality Date   BREAST SURGERY     MASTECTOMY Left 1997   Social History   Socioeconomic History   Marital status: Married    Spouse name: Dominica Severin   Number of children: 1   Years of education: Not on file   Highest education level: Not on file  Occupational History   Occupation: Pharmacist, hospital - [employer]  Tobacco Use   Smoking status: Never   Smokeless tobacco: Never  Vaping Use   Vaping Use: Never used  Substance and Sexual Activity   Alcohol use: No   Drug use: No   Sexual activity: Yes    Partners: Male    Birth  control/protection: Post-menopausal  Other Topics Concern   Not on file  Social History Narrative   Not on file   Social Determinants of Health   Financial Resource Strain: Not on file  Food Insecurity: Not on file  Transportation Needs: Not on file  Physical Activity: Not on file  Stress: Not on file  Social Connections: Not on file  Intimate Partner Violence: Not on file   Current Outpatient Medications on File Prior to Visit  Medication Sig Dispense Refill   benzonatate (TESSALON PERLES) 100 MG capsule Take 1 capsule (100 mg total) by mouth every 6 (six) hours as needed for cough. (Patient not taking: Reported on 05/13/2021) 30 capsule 1   Cholecalciferol (VITAMIN D) 50 MCG (2000 UT) tablet Take 1 tablet (2,000 Units total) by mouth daily. (Patient not taking: Reported on 10/15/2021) 30 tablet  0   fluticasone (FLONASE) 50 MCG/ACT nasal spray SPRAY 2 SPRAYS INTO EACH NOSTRIL EVERY DAY 48 mL 1   No current facility-administered medications on file prior to visit.   Allergies  Allergen Reactions   Almond Oil    Peanut-Containing Drug Products    Soy Allergy    Wheat Bran    Family History  Problem Relation Age of Onset   Hypertension Mother    Thyroid disease Mother    Heart disease Father    Hyperlipidemia Father    Hypertension Father    Stroke Father    Diabetes Father    Hypertension Sister    Diabetes Sister    Hypertension Brother    Breast cancer Maternal Aunt    PE: BP 120/84 (BP Location: Right Arm, Patient Position: Sitting, Cuff Size: Normal)   Pulse 91   Ht 5' 4.2" (1.631 m)   Wt 178 lb 9.6 oz (81 kg)   LMP 03/16/2000   SpO2 99%   BMI 30.47 kg/m  Wt Readings from Last 3 Encounters:  02/16/22 178 lb 9.6 oz (81 kg)  10/15/21 177 lb 12.8 oz (80.6 kg)  04/07/21 177 lb 9.6 oz (80.6 kg)   Constitutional: overweight, in NAD Eyes: EOMI, no exophthalmos ENT: + Mild fulness R lower cervical region, no thyroid nodules palpated, no cervical  lymphadenopathy Cardiovascular: RRR, No MRG Respiratory: CTA B Musculoskeletal: no deformities Skin: no rashes Neurological: no tremor with outstretched hands  ASSESSMENT: 1. Thyroid nodules  PLAN: 1. Thyroid nodules -Patient has a history of thyroid nodules per review of the images and reports of her thyroid ultrasounds going back to 2005.  The dominant nodules were not large and they did not have concerning features with the exception of the right 1.9 cm thyroid nodule, which was hypoechoic. -She does not have a thyroid cancer family history or personal history of radiation therapy to head or neck to increase her own risk of cancer -She previously had a biopsy of the dominant right thyroid nodule and this showed Hurthle cell lesion or neoplasm I explained to the difference between the 2 entities.  In her case, this was classified as Bethesda category 4 and I explained that this was an indeterminant result, with the risk of cancer closer to 15% but up to 30%.  After the visit, a formal molecular marker returned benign, which confers a less than 4% risk of thyroid cancer.  This is an excellent report. -Therefore, as of now, we are following her clinically and with ultrasounds - We did discuss about thyroid cancer in general is a cancer with indolent course and very good prognosis.  We also discussed about her Hurthle cancer in particular and the fact that he has an increased propensity for vascular metastasis compared to the regular thyroid cancer, but it still has a very good prognosis. -We reviewed together her latest TSH and it was normal in 02/2021.  We do not need to repeat this today but we discussed that she can have this checked by PCP at her annual physical exam. -At today's visit, she continues to have fullness in the right lower cervical region, which is slightly less prominent from before.  She does not have neck compression symptoms. -We will order a new ultrasound now and if stable,  I plan to repeat this in 2 years -Otherwise, I will see her back in a year.  Orders Placed This Encounter  Procedures   US THYROID   Philemon Kingdom, MD PhD  [facility]  

## 2022-03-13 ENCOUNTER — Encounter: Payer: Self-pay | Admitting: Internal Medicine

## 2022-03-17 ENCOUNTER — Other Ambulatory Visit: Payer: Self-pay | Admitting: Internal Medicine

## 2022-03-17 DIAGNOSIS — E042 Nontoxic multinodular goiter: Secondary | ICD-10-CM

## 2022-03-24 LAB — HM PAP SMEAR: HPV, high-risk: NEGATIVE

## 2022-04-16 ENCOUNTER — Ambulatory Visit
Admission: RE | Admit: 2022-04-16 | Discharge: 2022-04-16 | Disposition: A | Discharge status: Home / Self Care | Payer: BC Managed Care – PPO | Source: Ambulatory Visit | Attending: Internal Medicine | Admitting: Internal Medicine

## 2022-04-16 ENCOUNTER — Ambulatory Visit (INDEPENDENT_AMBULATORY_CARE_PROVIDER_SITE_OTHER): Payer: BC Managed Care – PPO | Admitting: Internal Medicine

## 2022-04-16 ENCOUNTER — Encounter: Payer: Self-pay | Admitting: Internal Medicine

## 2022-04-16 VITALS — BP 122/82 | HR 81 | Temp 98.9°F | Ht 64.0 in | Wt 180.4 lb

## 2022-04-16 DIAGNOSIS — Z0000 Encounter for general adult medical examination without abnormal findings: Secondary | ICD-10-CM | POA: Diagnosis not present

## 2022-04-16 DIAGNOSIS — Z6830 Body mass index (BMI) 30.0-30.9, adult: Secondary | ICD-10-CM | POA: Diagnosis not present

## 2022-04-16 DIAGNOSIS — Z8249 Family history of ischemic heart disease and other diseases of the circulatory system: Secondary | ICD-10-CM

## 2022-04-16 DIAGNOSIS — L858 Other specified epidermal thickening: Secondary | ICD-10-CM | POA: Diagnosis not present

## 2022-04-16 DIAGNOSIS — E6609 Other obesity due to excess calories: Secondary | ICD-10-CM | POA: Diagnosis not present

## 2022-04-16 DIAGNOSIS — R7303 Prediabetes: Secondary | ICD-10-CM | POA: Insufficient documentation

## 2022-04-16 DIAGNOSIS — E119 Type 2 diabetes mellitus without complications: Secondary | ICD-10-CM | POA: Insufficient documentation

## 2022-04-16 DIAGNOSIS — Z2821 Immunization not carried out because of patient refusal: Secondary | ICD-10-CM

## 2022-04-16 DIAGNOSIS — E042 Nontoxic multinodular goiter: Secondary | ICD-10-CM

## 2022-04-16 NOTE — Patient Instructions (Addendum)
The 10-year ASCVD risk score (Arnett DK, et al., 2019) is: 4.1%   Values used to calculate the score:     Age: 62 years     Sex: Female     Is Non-Hispanic African American: Yes     Diabetic: No     Tobacco smoker: No     Systolic Blood Pressure: 297 mmHg     Is BP treated: No     HDL Cholesterol: 65 mg/dL     Total Cholesterol: 202 mg/dL  Coronary Calcium Scan A coronary calcium scan is an imaging test used to look for deposits of plaque in the inner lining of the blood vessels of the heart (coronary arteries). Plaque is made up of calcium, protein, and fatty substances. These deposits of plaque can partly clog and narrow the coronary arteries without producing any symptoms or warning signs. This puts a person at risk for a heart attack. A coronary calcium scan is performed using a computed tomography (CT) scanner machine without using a dye (contrast). This test is recommended for people who are at moderate risk for heart disease. The test can find plaque deposits before symptoms develop. Tell a health care provider about: Any allergies you have. All medicines you are taking, including vitamins, herbs, eye drops, creams, and over-the-counter medicines. Any problems you or family members have had with anesthetic medicines. Any bleeding problems you have. Any surgeries you have had. Any medical conditions you have. Whether you are pregnant or may be pregnant. What are the risks? Generally, this is a safe procedure. However, problems may occur, including: Harm to a pregnant woman and her unborn baby. This test involves the use of radiation. Radiation exposure can be dangerous to a pregnant woman and her unborn baby. If you are pregnant or think you may be pregnant, you should not have this procedure done. A slight increase in the risk of cancer. This is because of the radiation involved in the test. The amount of radiation from one test is similar to the amount of radiation you are  naturally exposed to over one year. What happens before the procedure? Ask your health care provider for any specific instructions on how to prepare for this procedure. You may be asked to avoid products that contain caffeine, tobacco, or nicotine for 4 hours before the procedure. What happens during the procedure?  You will undress and remove any jewelry from your neck or chest. You may need to remove hearing aides and dentures. Women may need to remove their bras. You will put on a hospital gown. Sticky electrodes will be placed on your chest. The electrodes will be connected to an electrocardiogram (ECG) machine to record a tracing of the electrical activity of your heart. You will lie down on your back on a curved bed that is attached to the [place]. You may be given medicine to slow down your heart rate so that clear pictures can be created. You will be moved into the CT scanner, and the CT scanner will take pictures of your heart. During this time, you will be asked to lie still and hold your breath for 10-20 seconds at a time while each picture of your heart is being taken. The procedure may vary among health care providers and hospitals. What can I expect after the procedure? You can return to your normal activities. It is up to you to get the results of your procedure. Ask your health care provider, or the department that is doing  the procedure, when your results will be ready. Summary A coronary calcium scan is an imaging test used to look for deposits of plaque in the inner lining of the blood vessels of the heart. Plaque is made up of calcium, protein, and fatty substances. A coronary calcium scan is performed using a CT scanner machine without contrast. Generally, this is a safe procedure. Tell your health care provider if you are pregnant or may be pregnant. Ask your health care provider for any specific instructions on how to prepare for this procedure. You can return to your  normal activities after the scan is done. This information is not intended to replace advice given to you by your health care provider. Make sure you discuss any questions you have with your health care provider. Document Revised: 02/09/2021 Document Reviewed: 02/09/2021 Elsevier Patient Education  [place] Maintenance, Female Adopting a healthy lifestyle and getting preventive care are important in promoting health and wellness. Ask your health care provider about: The right schedule for you to have regular tests and exams. Things you can do on your own to prevent diseases and keep yourself healthy. What should I know about diet, weight, and exercise? Eat a healthy diet  Eat a diet that includes plenty of vegetables, fruits, low-fat dairy products, and lean protein. Do not eat a lot of foods that are high in solid fats, added sugars, or sodium. Maintain a healthy weight Body mass index (BMI) is used to identify weight problems. It estimates body fat based on height and weight. Your health care provider can help determine your BMI and help you achieve or maintain a healthy weight. Get regular exercise Get regular exercise. This is one of the most important things you can do for your health. Most adults should: Exercise for at least 150 minutes each week. The exercise should increase your heart rate and make you sweat (moderate-intensity exercise). Do strengthening exercises at least twice a week. This is in addition to the moderate-intensity exercise. Spend less time sitting. Even light physical activity can be beneficial. Watch cholesterol and blood lipids Have your blood tested for lipids and cholesterol at 60 years of age, then have this test every 5 years. Have your cholesterol levels checked more often if: Your lipid or cholesterol levels are high. You are older than 60 years of age. You are at high risk for heart disease. What should I know about cancer  screening? Depending on your health history and family history, you may need to have cancer screening at various ages. This may include screening for: Breast cancer. Cervical cancer. Colorectal cancer. Skin cancer. Lung cancer. What should I know about heart disease, diabetes, and high blood pressure? Blood pressure and heart disease High blood pressure causes heart disease and increases the risk of stroke. This is more likely to develop in people who have high blood pressure readings or are overweight. Have your blood pressure checked: Every 3-5 years if you are 30-32 years of age. Every year if you are 46 years old or older. Diabetes Have regular diabetes screenings. This checks your fasting blood sugar level. Have the screening done: Once every three years after age 4 if you are at a normal weight and have a low risk for diabetes. More often and at a younger age if you are overweight or have a high risk for diabetes. What should I know about preventing infection? Hepatitis B If you have a higher risk for hepatitis B, you should  be screened for this virus. Talk with your health care provider to find out if you are at risk for hepatitis B infection. Hepatitis C Testing is recommended for: Everyone born from [birth year] through 1963. Anyone with known risk factors for hepatitis C. Sexually transmitted infections (STIs) Get screened for STIs, including gonorrhea and chlamydia, if: You are sexually active and are younger than 60 years of age. You are older than 60 years of age and your health care provider tells you that you are at risk for this type of infection. Your sexual activity has changed since you were last screened, and you are at increased risk for chlamydia or gonorrhea. Ask your health care provider if you are at risk. Ask your health care provider about whether you are at high risk for HIV. Your health care provider may recommend a prescription medicine to help prevent HIV  infection. If you choose to take medicine to prevent HIV, you should first get tested for HIV. You should then be tested every 3 months for as long as you are taking the medicine. Pregnancy If you are about to stop having your period (premenopausal) and you may become pregnant, seek counseling before you get pregnant. Take 400 to 800 micrograms (mcg) of folic acid every day if you become pregnant. Ask for birth control (contraception) if you want to prevent pregnancy. Osteoporosis and menopause Osteoporosis is a disease in which the bones lose minerals and strength with aging. This can result in bone fractures. If you are 26 years old or older, or if you are at risk for osteoporosis and fractures, ask your health care provider if you should: Be screened for bone loss. Take a calcium or vitamin D supplement to lower your risk of fractures. Be given hormone replacement therapy (HRT) to treat symptoms of menopause. Follow these instructions at home: Alcohol use Do not drink alcohol if: Your health care provider tells you not to drink. You are pregnant, may be pregnant, or are planning to become pregnant. If you drink alcohol: Limit how much you have to: 0-1 drink a day. Know how much alcohol is in your drink. In the [place] one drink equals one 12 oz bottle of beer (355 mL), one 5 oz glass of wine (148 mL), or one 1 oz glass of hard liquor (44 mL). Lifestyle Do not use any products that contain nicotine or tobacco. These products include cigarettes, chewing tobacco, and vaping devices, such as e-cigarettes. If you need help quitting, ask your health care provider. Do not use street drugs. Do not share needles. Ask your health care provider for help if you need support or information about quitting drugs. General instructions Schedule regular health, dental, and eye exams. Stay current with your vaccines. Tell your health care provider if: You often feel depressed. You have ever been abused  or do not feel safe at home. Summary Adopting a healthy lifestyle and getting preventive care are important in promoting health and wellness. Follow your health care provider's instructions about healthy diet, exercising, and getting tested or screened for diseases. Follow your health care provider's instructions on monitoring your cholesterol and blood pressure. This information is not intended to replace advice given to you by your health care provider. Make sure you discuss any questions you have with your health care provider. Document Revised: 07/22/2020 Document Reviewed: 07/22/2020 Elsevier Patient Education  [place].

## 2022-04-16 NOTE — Progress Notes (Signed)
I,Victoria T Hamilton,acting as a scribe for Maximino Greenland, MD.,have documented all relevant documentation on the behalf of Maximino Greenland, MD,as directed by  Maximino Greenland, MD while in the presence of Maximino Greenland, MD.   Subjective:     Patient ID: Gloria Chen , female    DOB: Aug 22, 1960 , 62 y.o.   MRN: [id number]   Chief Complaint  Patient presents with   Annual Exam    HPI  She is here today for a full physical exam. She has her pelvic exams performed by her GYN, Dr.Angela Roberts.  Last pap was January 2nd, 2024. She had her last mammogram performed on this date as well. She denies having any headaches, chest pain and shortness of breath.       Past Medical History:  Diagnosis Date   Allergy    Cancer ([place])    Constipation    Edema, lower extremity    Food allergy, peanut    Lactose intolerance .   Personal history of chemotherapy    Pre-diabetes    Swelling of right ankle joint    Vitamin D deficiency      Family History  Problem Relation Age of Onset   Hypertension Mother    Thyroid disease Mother    Heart disease Father    Hyperlipidemia Father    Hypertension Father    Stroke Father    Diabetes Father    Hypertension Sister    Diabetes Sister    Hypertension Brother    Breast cancer Maternal Aunt      Current Outpatient Medications:    fluticasone (FLONASE) 50 MCG/ACT nasal spray, SPRAY 2 SPRAYS INTO EACH NOSTRIL EVERY DAY, Disp: 48 mL, Rfl: 1   benzonatate (TESSALON PERLES) 100 MG capsule, Take 1 capsule (100 mg total) by mouth every 6 (six) hours as needed for cough. (Patient not taking: Reported on 05/13/2021), Disp: 30 capsule, Rfl: 1   Cholecalciferol (VITAMIN D) 50 MCG (2000 UT) tablet, Take 1 tablet (2,000 Units total) by mouth daily. (Patient not taking: Reported on 10/15/2021), Disp: 30 tablet, Rfl: 0   Allergies  Allergen Reactions   Almond Oil    Peanut-Containing Drug Products    Soy Allergy    Wheat Bran       The patient  states she uses none for birth control. Last LMP was Patient's last menstrual period was 03/16/2000.. Negative for Dysmenorrhea. Negative for: breast discharge, breast lump(s), breast pain and breast self exam. Associated symptoms include abnormal vaginal bleeding. Pertinent negatives include abnormal bleeding (hematology), anxiety, decreased libido, depression, difficulty falling sleep, dyspareunia, history of infertility, nocturia, sexual dysfunction, sleep disturbances, urinary incontinence, urinary urgency, vaginal discharge and vaginal itching. Diet regular.The patient states her exercise level is    . The patient's tobacco use is:  Social History   Tobacco Use  Smoking Status Never  Smokeless Tobacco Never  . She has been exposed to passive smoke. The patient's alcohol use is:  Social History   Substance and Sexual Activity  Alcohol Use No      Review of Systems  Constitutional: Negative.   HENT: Negative.    Eyes: Negative.   Respiratory: Negative.    Cardiovascular: Negative.   Gastrointestinal: Negative.   Endocrine: Negative.   Genitourinary: Negative.   Musculoskeletal: Negative.   Skin: Negative.   Allergic/Immunologic: Negative.   Neurological: Negative.   Hematological: Negative.   Psychiatric/Behavioral: Negative.       Today's Vitals  04/16/22 0954  BP: 122/82  Pulse: 81  Temp: 98.9 F (37.2 C)  SpO2: 98%  Weight: 180 lb 6.4 oz (81.8 kg)  Height: '5\' 4"'$  (1.626 m)   Body mass index is 30.97 kg/m.  Wt Readings from Last 3 Encounters:  04/16/22 180 lb 6.4 oz (81.8 kg)  02/16/22 178 lb 9.6 oz (81 kg)  10/15/21 177 lb 12.8 oz (80.6 kg)    Objective:  Physical Exam Vitals and nursing note reviewed.  Constitutional:      Appearance: Normal appearance.  HENT:     Head: Normocephalic and atraumatic.     Right Ear: Tympanic membrane, ear canal and external ear normal.     Left Ear: Tympanic membrane, ear canal and external ear normal.     Nose:      Comments: Masked     Mouth/Throat:     Comments: Masked  Eyes:     Extraocular Movements: Extraocular movements intact.     Conjunctiva/sclera: Conjunctivae normal.     Pupils: Pupils are equal, round, and reactive to light.  Cardiovascular:     Rate and Rhythm: Normal rate and regular rhythm.     Pulses: Normal pulses.     Heart sounds: Normal heart sounds.  Pulmonary:     Effort: Pulmonary effort is normal.     Breath sounds: Normal breath sounds.  Chest:  Breasts:    Tanner Score is 5.     Right: Normal.     Comments: Left mastectomy Abdominal:     General: Bowel sounds are normal.     Palpations: Abdomen is soft.     Comments: Rounded, soft  Genitourinary:    Comments: deferred Musculoskeletal:        General: Normal range of motion.     Cervical back: Normal range of motion and neck supple.  Skin:    General: Skin is warm and dry.     Comments: Sl hyperpigmented papular lesions b/l upper arms  Neurological:     General: No focal deficit present.     Mental Status: She is alert and oriented to person, place, and time.  Psychiatric:        Mood and Affect: Mood normal.        Behavior: Behavior normal.       Assessment And Plan:     1. Encounter for annual health examination Comments: A full exam was performed. Importance of monthly self breast exams was discussed with the patient. PATIENT IS ADVISED TO GET 30-45 MINUTES REGULAR EXERCISE NO LESS THAN FOUR TO FIVE DAYS PER WEEK - BOTH WEIGHTBEARING EXERCISES AND AEROBIC ARE RECOMMENDED.  PATIENT IS ADVISED TO FOLLOW A HEALTHY DIET WITH AT LEAST SIX FRUITS/VEGGIES PER DAY, DECREASE INTAKE OF RED MEAT, AND TO INCREASE FISH INTAKE TO TWO DAYS PER WEEK.  MEATS/FISH SHOULD NOT BE FRIED, BAKED OR BROILED IS PREFERABLE.  IT IS ALSO IMPORTANT TO CUT BACK ON YOUR SUGAR INTAKE. PLEASE AVOID ANYTHING WITH ADDED SUGAR, CORN SYRUP OR OTHER SWEETENERS. IF YOU MUST USE A SWEETENER, YOU CAN TRY STEVIA. IT IS ALSO IMPORTANT TO AVOID  ARTIFICIALLY SWEETENERS AND DIET BEVERAGES. LASTLY, I SUGGEST WEARING SPF 50 SUNSCREEN ON EXPOSED PARTS AND ESPECIALLY WHEN IN THE DIRECT SUNLIGHT FOR AN EXTENDED PERIOD OF TIME.  PLEASE AVOID FAST FOOD RESTAURANTS AND INCREASE YOUR WATER INTAKE. - CMP14+EGFR - CBC - Lipid panel - Hemoglobin A1c - TSH  2. Keratosis pilaris Comments: She is advised to increase her intake of healthy fats  and to use Aquaphor or Cerave or Cetaphil for body moisturizers. I will refer to Derm for further eval. - Ambulatory referral to Dermatology  3. Class 1 obesity due to excess calories without serious comorbidity with body mass index (BMI) of 30.0 to 30.9 in adult Comments: She is encouraged to strive for BMI less than 30 to decrease cardiac risk. Advised to aim for at least 150 minutes of exercise per week.  4. Family history of heart disease Comments: We discussed use of cardiac calcium scoring. She agrees to testing. - CT CARDIAC SCORING (SELF PAY ONLY); Future  5. Influenza vaccination declined  Patient was given opportunity to ask questions. Patient verbalized understanding of the plan and was able to repeat key elements of the plan. All questions were answered to their satisfaction.   I, Maximino Greenland, MD, have reviewed all documentation for this visit. The documentation on 04/16/22 for the exam, diagnosis, procedures, and orders are all accurate and complete.   THE PATIENT IS ENCOURAGED TO PRACTICE SOCIAL DISTANCING DUE TO THE COVID-19 PANDEMIC.

## 2022-04-17 ENCOUNTER — Other Ambulatory Visit: Payer: BC Managed Care – PPO

## 2022-04-17 LAB — CMP14+EGFR
ALT: 23 IU/L (ref 0–32)
AST: 15 IU/L (ref 0–40)
Albumin/Globulin Ratio: 1.6 (ref 1.2–2.2)
Albumin: 4.6 g/dL (ref 3.8–4.9)
Alkaline Phosphatase: 80 IU/L (ref 44–121)
BUN/Creatinine Ratio: 22 (ref 12–28)
BUN: 11 mg/dL (ref 8–27)
Bilirubin Total: 0.5 mg/dL (ref 0.0–1.2)
CO2: 21 mmol/L (ref 20–29)
Calcium: 9.9 mg/dL (ref 8.7–10.3)
Chloride: 101 mmol/L (ref 96–106)
Creatinine, Ser: 0.5 mg/dL — ABNORMAL LOW (ref 0.57–1.00)
Globulin, Total: 2.9 g/dL (ref 1.5–4.5)
Glucose: 110 mg/dL — ABNORMAL HIGH (ref 70–99)
Potassium: 4.5 mmol/L (ref 3.5–5.2)
Sodium: 139 mmol/L (ref 134–144)
Total Protein: 7.5 g/dL (ref 6.0–8.5)
eGFR: 107 mL/min/{1.73_m2} (ref >59)

## 2022-04-17 LAB — TSH: TSH: 0.668 u[IU]/mL (ref 0.450–4.500)

## 2022-04-17 LAB — LIPID PANEL
Chol/HDL Ratio: 3 ratio (ref 0.0–4.4)
Cholesterol, Total: 193 mg/dL (ref 100–199)
HDL: 65 mg/dL (ref >39)
LDL Chol Calc (NIH): 111 mg/dL — ABNORMAL HIGH (ref 0–99)
Triglycerides: 92 mg/dL (ref 0–149)
VLDL Cholesterol Cal: 17 mg/dL (ref 5–40)

## 2022-04-17 LAB — CBC
Hematocrit: 40 % (ref 34.0–46.6)
Hemoglobin: 13.7 g/dL (ref 11.1–15.9)
MCH: 30.6 pg (ref 26.6–33.0)
MCHC: 34.3 g/dL (ref 31.5–35.7)
MCV: 90 fL (ref 79–97)
Platelets: 238 10*3/uL (ref 150–450)
RBC: 4.47 x10E6/uL (ref 3.77–5.28)
RDW: 12.2 % (ref 11.7–15.4)
WBC: 4.7 10*3/uL (ref 3.4–10.8)

## 2022-04-17 LAB — HEMOGLOBIN A1C
Est. average glucose Bld gHb Est-mCnc: 131 mg/dL
Hgb A1c MFr Bld: 6.2 % — ABNORMAL HIGH (ref 4.8–5.6)

## 2022-05-15 ENCOUNTER — Other Ambulatory Visit (HOSPITAL_BASED_OUTPATIENT_CLINIC_OR_DEPARTMENT_OTHER): Payer: BC Managed Care – PPO

## 2022-06-25 ENCOUNTER — Ambulatory Visit (HOSPITAL_BASED_OUTPATIENT_CLINIC_OR_DEPARTMENT_OTHER)
Admission: RE | Admit: 2022-06-25 | Discharge: 2022-06-25 | Disposition: A | Discharge status: Home / Self Care | Payer: BC Managed Care – PPO | Source: Ambulatory Visit | Attending: Internal Medicine | Admitting: Internal Medicine

## 2022-06-25 DIAGNOSIS — Z8249 Family history of ischemic heart disease and other diseases of the circulatory system: Secondary | ICD-10-CM | POA: Insufficient documentation

## 2022-08-03 ENCOUNTER — Encounter: Payer: Self-pay | Admitting: Internal Medicine

## 2022-08-03 ENCOUNTER — Ambulatory Visit: Payer: BC Managed Care – PPO | Admitting: Internal Medicine

## 2022-08-03 VITALS — BP 112/72 | HR 73 | Temp 98.3°F | Ht 63.8 in | Wt 180.4 lb

## 2022-08-03 DIAGNOSIS — Z6831 Body mass index (BMI) 31.0-31.9, adult: Secondary | ICD-10-CM

## 2022-08-03 DIAGNOSIS — E6609 Other obesity due to excess calories: Secondary | ICD-10-CM | POA: Diagnosis not present

## 2022-08-03 DIAGNOSIS — R7309 Other abnormal glucose: Secondary | ICD-10-CM

## 2022-08-03 DIAGNOSIS — M25551 Pain in right hip: Secondary | ICD-10-CM | POA: Diagnosis not present

## 2022-08-03 DIAGNOSIS — N3941 Urge incontinence: Secondary | ICD-10-CM | POA: Diagnosis not present

## 2022-08-03 NOTE — Patient Instructions (Addendum)
Pelvic floor exercises  Hip Bursitis  Hip bursitis is swelling of one or more fluid-filled sacs (bursae) in your hip joint. If the bursa becomes irritated, it can fill with extra fluid and become swollen. This condition can cause pain, and your symptoms may come and go over time. What are the causes? Repeated use of your hip muscles. Injury to the hip. Weak butt muscles. Bone spurs. Infection. In some cases, the cause may not be known. What increases the risk? Having a past hip injury or hip surgery. Having a condition, such as arthritis, gout, diabetes, or thyroid disease. Having spine problems. Having one leg that is shorter than the other. Running a lot or doing long-distance running. Playing sports where there is a risk of injury or falling, such as football, martial arts, or skiing. What are the signs or symptoms? Symptoms may come and go, and they often include: Pain in the hip or groin area. Pain may get worse when you move your hip. Tenderness and swelling of the hip. In rare cases, the bursa may become infected. If this happens, you may: Get a fever. Have warmth and redness in the hip area. How is this treated? This condition is treated by: Resting your hip. Icing your hip. Wrapping the hip area with an elastic bandage (compression wrap). Keeping the hip raised. Other treatments may include: Using crutches, a cane, or a walker. Medicines. Draining fluid out of the bursa. Surgery to take out a bursa. This is rare. Long-term treatment may include: Doing exercises to help your strength and flexibility. Lifestyle changes like losing weight to lessen the strain on your hip. Follow these instructions at home: Managing pain, stiffness, and swelling     If told, put ice on the painful area. To do this: Put ice in a plastic bag. Place a towel between your skin and the bag. Leave the ice on for 20 minutes, 2-3 times a day. Take off the ice if your skin turns bright  red. This is very important. If you cannot feel pain, heat, or cold, you have a greater risk of damage to the area. Raise your hip by putting a pillow under your hips while you lie down. Stop if you feel pain. If told, put heat on the affected area. Do this as often as told by your doctor. Use the heat source that your doctor recommends, such as a moist heat pack or a heating pad. Place a towel between your skin and the heat source. Leave the heat on for 20-30 minutes. Take off the heat if your skin turns bright red. This is very important. If you cannot feel pain, heat, or cold, you have a greater risk of getting burned. Activity Do not use your hip to support your body weight until your doctor says that you can. Use crutches, a cane, or a walker as told by your doctor. If the affected leg is one that you use to drive, ask your doctor if it is safe to drive. Rest and protect your hip as much as you can until you feel better. Return to your normal activities when your doctor says that it is safe. Do exercises as told by your doctor. General instructions Take over-the-counter and prescription medicines only as told by your doctor. Gently rub and stretch your injured area as often as is comfortable. Wear elastic bandages only as told by your doctor. If one of your legs is shorter than the other, get fitted for a shoe insert or  orthotic. Keep a healthy weight. Follow instructions from your doctor. Keep all follow-up visits. How is this prevented? Exercise regularly or as told by your doctor. Wear the right shoes for the sport you play and for daily activities. Warm up and stretch before being active. Cool down and stretch after being active. Take breaks often from repeated activity. Avoid activities that bother your hip or cause pain. Avoid sitting down for a long time. Where to find more information American Academy of Orthopa[employer]geons: orthoinfo.aaos.org Contact a doctor if: You have  a fever. You have new symptoms. You have trouble walking or doing everyday activities. You have pain that gets worse or does not get better with medicine. The skin around your hip is red. You get a feeling of warmth in your hip area. Get help right away if: You cannot move your hip. You have very bad pain. You cannot control the muscles in your feet. Summary Hip bursitis is swelling of one or more fluid-filled sacs (bursae) in your hip joint. Symptoms often come and go over time. This condition is often treated by resting and icing the hip. It also may help to keep the area raised and wrapped in an elastic bandage. Other treatments may be needed. This information is not intended to replace advice given to you by your health care provider. Make sure you discuss any questions you have with your health care provider. Document Revised: 02/25/2021 Document Reviewed: 02/25/2021 Elsevier Patient Education  2023 [employer].

## 2022-08-03 NOTE — Progress Notes (Signed)
Jeri Cos [place]ittleton,acting as a Neurosurgeon for Gwynneth Aliment, MD.,have documented all relevant documentation on the behalf of Gwynneth Aliment, MD,as directed by  Gwynneth Aliment, MD while in the presence of Gwynneth Aliment, MD.    Subjective:     Patient ID: Gloria Chen , female    DOB: Mar 02, 1961 , 62 y.o.   MRN: [id number]   Chief Complaint  Patient presents with   Hip Pain    HPI  Patient presents today for  R hip pain. She reports the pain started about a 2 weeks ago. The pain started in her groin area but has now moved to her hip and buttocks. The pain radiates down to her butt. She reports she only feels the pain when she touches her hip or sleeps on it. She denies fall/trauma.  She is now unable to sleep on her right side.   Hip Pain  There was no injury mechanism. The pain is present in the right hip. The pain is at a severity of 2/10. The pain is mild.     Past Medical History:  Diagnosis Date   Allergy    Cancer ([facility])    Constipation    Edema, lower extremity    Food allergy, peanut    Lactose intolerance .   Personal history of chemotherapy    Pre-diabetes    Swelling of right ankle joint    Vitamin D deficiency      Family History  Problem Relation Age of Onset   Hypertension Mother    Thyroid disease Mother    Heart disease Father    Hyperlipidemia Father    Hypertension Father    Stroke Father    Diabetes Father    Hypertension Sister    Diabetes Sister    Hypertension Brother    Breast cancer Maternal Aunt      Current Outpatient Medications:    fluticasone (FLONASE) 50 MCG/ACT nasal spray, SPRAY 2 SPRAYS INTO EACH NOSTRIL EVERY DAY, Disp: 48 mL, Rfl: 1   Allergies  Allergen Reactions   Almond Oil    Peanut-Containing Drug Products    Soy Allergy    Wheat      Review of Systems  Constitutional: Negative.   Respiratory: Negative.    Cardiovascular: Negative.   Gastrointestinal: Negative.   Musculoskeletal:  Positive for arthralgias.   Neurological: Negative.   Psychiatric/Behavioral: Negative.       Today's Vitals   08/03/22 1137  BP: 112/72  Pulse: 73  Temp: 98.3 F (36.8 C)  Weight: 180 lb 6.4 oz (81.8 kg)  Height: 5' 3.8" (1.621 m)  PainSc: 2   PainLoc: Hip   Body mass index is 31.16 kg/m.  The 10-year ASCVD risk score (Arnett DK, et al., 2019) is: 3.1%   Values used to calculate the score:     Age: 30 years     Sex: Female     Is Non-Hispanic African American: Yes     Diabetic: No     Tobacco smoker: No     Systolic Blood Pressure: 112 mmHg     Is BP treated: No     HDL Cholesterol: 65 mg/dL     Total Cholesterol: 193 mg/dL ++ Objective:  Physical Exam Vitals and nursing note reviewed.  Constitutional:      Appearance: Normal appearance.  HENT:     Head: Normocephalic and atraumatic.  Eyes:     Extraocular Movements: Extraocular movements intact.  Cardiovascular:  Rate and Rhythm: Normal rate and regular rhythm.     Heart sounds: Normal heart sounds.  Pulmonary:     Effort: Pulmonary effort is normal.     Breath sounds: Normal breath sounds.  Musculoskeletal:        General: Tenderness present. No swelling.     Cervical back: Normal range of motion.     Comments: R hip tender to deep palpation, lower back tenderness No overlying erythema R lower back tenderness to palpation No r groin tenderness to palpation  Skin:    General: Skin is warm.  Neurological:     General: No focal deficit present.     Mental Status: She is alert.  Psychiatric:        Mood and Affect: Mood normal.        Behavior: Behavior normal.      Assessment And Plan:     1. Pain of right hip Comments: I think she may have pulled her hip flexor, we discussed several stretches to  perform after walking. If persistent, will refer her to Ortho for further eval.  2. Urge incontinence Comments: We discussed need for pelvic floor exercises. May benefit from pelvic floor PT. She will consider if no improvement  in her sx.  3. Other abnormal glucose Comments: Previous labs reviewed, a1c has been elevated. I will recheck this today. Encouraged to limit her intake of sugary beverages. - Hemoglobin A1c  4. Class 1 obesity due to excess calories without serious comorbidity with body mass index (BMI) of 31.0 to 31.9 in adult Comments: She is encouraged to aim for at least 150 minutes of exercise/week.    Return if symptoms worsen or fail to improve.  Patient was given opportunity to ask questions. Patient verbalized understanding of the plan and was able to repeat key elements of the plan. All questions were answered to their satisfaction.   I, Gwynneth Aliment, MD, have reviewed all documentation for this visit. The documentation on 08/03/22 for the exam, diagnosis, procedures, and orders are all accurate and complete.   IF YOU HAVE BEEN REFERRED TO A SPECIALIST, IT MAY TAKE 1-2 WEEKS TO SCHEDULE/PROCESS THE REFERRAL. IF YOU HAVE NOT HEARD FROM US/SPECIALIST IN TWO WEEKS, PLEASE GIVE [place] A CALL AT [phone] X [phone].   THE PATIENT IS ENCOURAGED TO PRACTICE SOCIAL DISTANCING DUE TO THE COVID-19 PANDEMIC.

## 2022-08-04 LAB — HEMOGLOBIN A1C
Est. average glucose Bld gHb Est-mCnc: 140 mg/dL
Hgb A1c MFr Bld: 6.5 % — ABNORMAL HIGH (ref 4.8–5.6)

## 2022-08-11 ENCOUNTER — Encounter: Payer: Self-pay | Admitting: Internal Medicine

## 2022-08-12 ENCOUNTER — Other Ambulatory Visit: Payer: Self-pay | Admitting: Internal Medicine

## 2022-08-27 ENCOUNTER — Other Ambulatory Visit: Payer: Self-pay | Admitting: Internal Medicine

## 2022-08-27 DIAGNOSIS — R7309 Other abnormal glucose: Secondary | ICD-10-CM

## 2022-08-27 DIAGNOSIS — R7303 Prediabetes: Secondary | ICD-10-CM

## 2022-08-27 MED ORDER — BLOOD GLUCOSE MONITOR KIT
PACK | 0 refills | Status: DC
Start: 2022-08-27 — End: 2022-10-15

## 2022-10-15 ENCOUNTER — Ambulatory Visit: Payer: BC Managed Care – PPO | Admitting: Internal Medicine

## 2022-10-15 ENCOUNTER — Encounter: Payer: Self-pay | Admitting: Internal Medicine

## 2022-10-15 VITALS — BP 110/82 | HR 77 | Temp 98.3°F | Ht 63.0 in | Wt 173.4 lb

## 2022-10-15 DIAGNOSIS — Z6830 Body mass index (BMI) 30.0-30.9, adult: Secondary | ICD-10-CM

## 2022-10-15 DIAGNOSIS — R7303 Prediabetes: Secondary | ICD-10-CM

## 2022-10-15 DIAGNOSIS — E6609 Other obesity due to excess calories: Secondary | ICD-10-CM

## 2022-10-15 DIAGNOSIS — E119 Type 2 diabetes mellitus without complications: Secondary | ICD-10-CM | POA: Diagnosis not present

## 2022-10-15 DIAGNOSIS — M79671 Pain in right foot: Secondary | ICD-10-CM

## 2022-10-15 MED ORDER — BLOOD GLUCOSE MONITOR KIT: PACK | 0 refills | Status: DC

## 2022-10-15 NOTE — Patient Instructions (Addendum)

## 2022-10-15 NOTE — Progress Notes (Signed)
I,Victoria T Deloria Lair, CMA,acting as a Neurosurgeon for Gwynneth Aliment, MD.,have documented all relevant documentation on the behalf of Gwynneth Aliment, MD,as directed by  Gwynneth Aliment, MD while in the presence of Gwynneth Aliment, MD.  Subjective:  Patient ID: Gloria Chen , female    DOB: Nov 21, 1960 , 62 y.o.   MRN: [id number]  Chief Complaint  Patient presents with   Prediabetes    HPI  Patient presents today for a f/u on her diabetes. She admits changing her lifestyle & eating habits. She also has been exercising regularly.   She adds, having an issue with her right foot & the heel. She states her foot hurts when getting out of bed in the mornings. She has been doing a lot of walking for exercise. Denies fall/trauma.   She also wants to be proactive & get a referral for nutritionist. She reports not wanting her A1c to get too high.      Past Medical History:  Diagnosis Date   Allergy    Cancer ([facility])    Constipation    Edema, lower extremity    Food allergy, peanut    Lactose intolerance .   Personal history of chemotherapy    Pre-diabetes    Swelling of right ankle joint    Vitamin D deficiency      Family History  Problem Relation Age of Onset   Hypertension Mother    Thyroid disease Mother    Heart disease Father    Hyperlipidemia Father    Hypertension Father    Stroke Father    Diabetes Father    Hypertension Sister    Diabetes Sister    Hypertension Brother    Breast cancer Maternal Aunt      Current Outpatient Medications:    fluticasone (FLONASE) 50 MCG/ACT nasal spray, SPRAY 2 SPRAYS INTO EACH NOSTRIL EVERY DAY, Disp: 48 mL, Rfl: 1   blood glucose meter kit and supplies KIT, Dispense based on patient and insurance preference. Use up to four times daily as directed., Disp: 1 each, Rfl: 0   Allergies  Allergen Reactions   Almond Oil    Peanut-Containing Drug Products    Soy Allergy    Wheat      Review of Systems  Constitutional: Negative.    Respiratory: Negative.    Cardiovascular: Negative.   Gastrointestinal: Negative.   Musculoskeletal:  Positive for arthralgias.       She c/o r heel pain. Denies fall/trauma. She has been walking more frequently.  Heel hurts most when getting out of bed in the mornings.   Neurological: Negative.   Psychiatric/Behavioral: Negative.       Today's Vitals   10/15/22 0908  BP: 110/82  Pulse: 77  Temp: 98.3 F (36.8 C)  SpO2: 98%  Weight: 173 lb 6.4 oz (78.7 kg)  Height: 5\' 3"  (1.6 m)   Body mass index is 30.72 kg/m.  Wt Readings from Last 3 Encounters:  10/15/22 173 lb 6.4 oz (78.7 kg)  08/03/22 180 lb 6.4 oz (81.8 kg)  04/16/22 180 lb 6.4 oz (81.8 kg)     Objective:  Physical Exam Vitals and nursing note reviewed.  Constitutional:      Appearance: Normal appearance.  HENT:     Head: Normocephalic and atraumatic.  Eyes:     Extraocular Movements: Extraocular movements intact.  Cardiovascular:     Rate and Rhythm: Normal rate and regular rhythm.     Heart sounds: Normal heart  sounds.  Pulmonary:     Effort: Pulmonary effort is normal.     Breath sounds: Normal breath sounds.  Musculoskeletal:     Cervical back: Normal range of motion.  Skin:    General: Skin is warm.  Neurological:     General: No focal deficit present.     Mental Status: She is alert.  Psychiatric:        Mood and Affect: Mood normal.        Behavior: Behavior normal.         Assessment And Plan:  Diabetes mellitus without complication [facility]) Assessment & Plan: New onset. She agrees to Nutrition referral. I will also send glucometer kit, encouraged to initially check sugars 3-4x/week. She does not wish to start meds if possible. She is encouraged to avoid sugary beverages and to decrease her intake of sugary foods and processed meats. She will f/u in 3 months for re-evaluation.   Orders: -     Hemoglobin A1c -     BMP8+EGFR -     Referral to Nutrition and Diabetes Services -     blood  glucose meter kit and supplies; Dispense based on patient and insurance preference. Use up to four times daily as directed.  Dispense: 1 each; Refill: 0  Right foot pain Assessment & Plan: Sx suggestive of plantar fasciitis. She was given stretching exercises to perform when seated and when first getting out of bed. If sx persist, will refer her to Podiatry for further evaluation.    Class 1 obesity due to excess calories without serious comorbidity with body mass index (BMI) of 30.0 to 30.9 in adult Assessment & Plan: She was congratulated on her 7lb weight loss since May 2024.  She is encouraged to keep up the great work!   She is encouraged to strive for BMI less than 30 to decrease cardiac risk. Advised to aim for at least 150 minutes of exercise per week.    Return in about 3 months (around 01/15/2023), or dm check.  Patient was given opportunity to ask questions. Patient verbalized understanding of the plan and was able to repeat key elements of the plan. All questions were answered to their satisfaction.   I, Gwynneth Aliment, MD, have reviewed all documentation for this visit. The documentation on 10/15/22 for the exam, diagnosis, procedures, and orders are all accurate and complete.   IF YOU HAVE BEEN REFERRED TO A SPECIALIST, IT MAY TAKE 1-2 WEEKS TO SCHEDULE/PROCESS THE REFERRAL. IF YOU HAVE NOT HEARD FROM US/SPECIALIST IN TWO WEEKS, PLEASE GIVE [place] A CALL AT [phone].   THE PATIENT IS ENCOURAGED TO PRACTICE SOCIAL DISTANCING DUE TO THE COVID-19 PANDEMIC.

## 2022-10-26 ENCOUNTER — Encounter: Payer: Self-pay | Admitting: Internal Medicine

## 2022-10-26 ENCOUNTER — Other Ambulatory Visit: Payer: Self-pay

## 2022-10-26 DIAGNOSIS — M79671 Pain in right foot: Secondary | ICD-10-CM

## 2022-10-26 NOTE — Assessment & Plan Note (Signed)
Sx suggestive of plantar fasciitis. She was given stretching exercises to perform when seated and when first getting out of bed. If sx persist, will refer her to Podiatry for further evaluation.

## 2022-10-26 NOTE — Assessment & Plan Note (Signed)
New onset. She agrees to Nutrition referral. I will also send glucometer kit, encouraged to initially check sugars 3-4x/week. She does not wish to start meds if possible. She is encouraged to avoid sugary beverages and to decrease her intake of sugary foods and processed meats. She will f/u in 3 months for re-evaluation.

## 2022-10-26 NOTE — Assessment & Plan Note (Signed)
She was congratulated on her 7lb weight loss since May 2024.  She is encouraged to keep up the great work!

## 2022-10-29 ENCOUNTER — Ambulatory Visit: Payer: BC Managed Care – PPO

## 2022-11-05 ENCOUNTER — Ambulatory Visit: Payer: BC Managed Care – PPO

## 2022-11-12 ENCOUNTER — Ambulatory Visit: Payer: BC Managed Care – PPO

## 2022-11-12 ENCOUNTER — Ambulatory Visit: Payer: BC Managed Care – PPO | Admitting: Podiatry

## 2022-11-12 ENCOUNTER — Ambulatory Visit (INDEPENDENT_AMBULATORY_CARE_PROVIDER_SITE_OTHER): Payer: BC Managed Care – PPO

## 2022-11-12 ENCOUNTER — Encounter: Payer: Self-pay | Admitting: Podiatry

## 2022-11-12 DIAGNOSIS — M722 Plantar fascial fibromatosis: Secondary | ICD-10-CM

## 2022-11-12 DIAGNOSIS — M779 Enthesopathy, unspecified: Secondary | ICD-10-CM

## 2022-11-12 DIAGNOSIS — M62462 Contracture of muscle, left lower leg: Secondary | ICD-10-CM

## 2022-11-12 DIAGNOSIS — M62461 Contracture of muscle, right lower leg: Secondary | ICD-10-CM

## 2022-11-12 MED ORDER — MELOXICAM 15 MG PO TABS: 15.0000 mg | ORAL_TABLET | Freq: Every day | ORAL | 3 refills | Status: DC

## 2022-11-12 NOTE — Patient Instructions (Signed)

## 2022-11-13 NOTE — Progress Notes (Signed)
  Subjective:  Patient ID: Gloria Chen, female    DOB: 1961/02/24,  MRN: [id number]  Chief Complaint  Patient presents with   Foot Pain    "I have heel pain in both feet.  The right by far is the worse.  The left one just started." N - heel pain L - medial bilateral D - over 1 yr O - gradually worse C - ache, sore A - standing, get up out of bed in the morning T - none    62 y.o. female presents with the above complaint. History confirmed with patient.   Objective:  Physical Exam: warm, good capillary refill, no trophic changes or ulcerative lesions, normal DP and PT pulses, and normal sensory exam. Left Foot: point tenderness over the heel pad and gastrocnemius equinus is noted with a positive silverskiold test Right Foot: point tenderness over the heel pad and gastrocnemius equinus is noted with a positive silverskiold test  No images are attached to the encounter.  Radiographs: Multiple views x-ray of both feet: no fracture, dislocation, swelling or degenerative changes noted and plantar calcaneal spur Assessment:   1. Plantar fasciitis of left foot   2. Plantar fasciitis of right foot   3. Gastrocnemius equinus of left lower extremity   4. Gastrocnemius equinus of right lower extremity      Plan:  Patient was evaluated and treated and all questions answered.   Discussed the etiology and treatment options for plantar fasciitis including stretching, formal physical therapy, supportive shoegears such as a running shoe or sneaker, pre fabricated orthoses, injection therapy, and oral medications. We also discussed the role of surgical treatment of this for patients who do not improve after exhausting non-surgical treatment options.   -XR reviewed with patient -Educated patient on stretching and icing of the affected limb -Rx for meloxicam. 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  Hopefully therapy can relieve the equinus I do think she would benefit greatly from a medial longitudinal arch supports she will be casted for this from our orthotist.  Can utilize horseshoe or PPT pad to offload heel spur as well -We discussed the option of a corticosteroid injection, she has been in the prediabetes range of A1c and just got it back into normal range, would like to avoid this for now, if she is not at least 50% better by the next visit and we will plan to do this.   Return in about 5 weeks (around 12/17/2022) for recheck plantar fasciitis.

## 2022-12-03 ENCOUNTER — Ambulatory Visit: Payer: BC Managed Care – PPO

## 2022-12-05 NOTE — Progress Notes (Signed)
Patient was seen, measured for custom molded foot orthotics. Patient has HX of PF  PT WANTS TO VERIFY COVERAGE BEFORE PROCEEDING   Patient will benefit from CFO's as they will help provide total contact to MLA's helping to better distribute body weight across BIL feet greater reducing plantar pressure and pain and to also encourage FF and RF alignment.  Patient was scanned items to be ordered and fit when in  [employer], [employer], CF[employer]

## 2022-12-08 ENCOUNTER — Telehealth: Payer: Self-pay

## 2022-12-08 NOTE — Telephone Encounter (Signed)
Patient called. She said she spoke with her insurance company and she said it is ok to order her orthotics.

## 2022-12-16 ENCOUNTER — Ambulatory Visit: Payer: Self-pay | Admitting: Internal Medicine

## 2022-12-17 ENCOUNTER — Ambulatory Visit: Payer: BC Managed Care – PPO | Admitting: Podiatry

## 2022-12-21 ENCOUNTER — Encounter: Payer: Self-pay | Admitting: Internal Medicine

## 2022-12-21 ENCOUNTER — Ambulatory Visit: Payer: BC Managed Care – PPO | Admitting: Internal Medicine

## 2022-12-21 VITALS — BP 124/86 | HR 77 | Temp 98.1°F | Ht 63.0 in | Wt 173.2 lb

## 2022-12-21 DIAGNOSIS — R030 Elevated blood-pressure reading, without diagnosis of hypertension: Secondary | ICD-10-CM

## 2022-12-21 DIAGNOSIS — Z6830 Body mass index (BMI) 30.0-30.9, adult: Secondary | ICD-10-CM

## 2022-12-21 DIAGNOSIS — E119 Type 2 diabetes mellitus without complications: Secondary | ICD-10-CM

## 2022-12-21 DIAGNOSIS — E66811 Obesity, class 1: Secondary | ICD-10-CM

## 2022-12-21 DIAGNOSIS — R002 Palpitations: Secondary | ICD-10-CM | POA: Diagnosis not present

## 2022-12-21 DIAGNOSIS — E1169 Type 2 diabetes mellitus with other specified complication: Secondary | ICD-10-CM | POA: Diagnosis not present

## 2022-12-21 DIAGNOSIS — E6609 Other obesity due to excess calories: Secondary | ICD-10-CM

## 2022-12-21 DIAGNOSIS — M722 Plantar fascial fibromatosis: Secondary | ICD-10-CM

## 2022-12-21 DIAGNOSIS — Z77120 Contact with and (suspected) exposure to mold (toxic): Secondary | ICD-10-CM

## 2022-12-21 DIAGNOSIS — R519 Headache, unspecified: Secondary | ICD-10-CM

## 2022-12-21 DIAGNOSIS — E669 Obesity, unspecified: Secondary | ICD-10-CM

## 2022-12-21 LAB — HM DIABETES EYE EXAM

## 2022-12-21 NOTE — Patient Instructions (Signed)
Preventing Hypertension Hypertension, also called high blood pressure, is when the force of blood pumping through the arteries is too strong. Arteries are blood vessels that carry blood from the heart throughout the body. Often, hypertension does not cause symptoms until blood pressure is very high. It is important to have your blood pressure checked regularly. Diet and lifestyle changes can help you prevent hypertension, and they may make you feel better overall and improve your quality of life. If you already have hypertension, you may control it with diet and lifestyle changes, as well as with medicine. How can this condition affect me? Over time, hypertension can damage the arteries and decrease blood flow to important parts of the body, including the brain, heart, and kidneys. By keeping your blood pressure in a healthy range, you can help prevent complications like heart attack, heart failure, stroke, kidney failure, and vascular dementia. What can increase my risk? An unhealthy diet and a lack of physical activity can make you more likely to develop high blood pressure. Some other risk factors include: Age. The risk increases with age. Having family members who have had high blood pressure. Having certain health conditions, such as thyroid problems. Being overweight or obese. Drinking too much alcohol or caffeine. Having too much fat, sugar, calories, or salt (sodium) in your diet. Smoking or using illegal drugs. Taking certain medicines, such as antidepressants, decongestants, birth control pills, and NSAIDs, such as ibuprofen. What actions can I take to prevent or manage this condition? Work with your health care provider to make a hypertension prevention plan that works for you. You may be referred for counseling on a healthy diet and physical activity. Follow your plan and keep all follow-up visits. Diet changes Maintain a healthy diet. This includes: Eating less salt (sodium). Ask your  health care provider how much sodium is safe for you to have. The general recommendation is to have less than 1 tsp (2,300 mg) of sodium a day. Do not add salt to your food. Choose low-sodium options when grocery shopping and eating out. Limiting fats in your diet. You can do this by eating low-fat or fat-free dairy products and by eating less red meat. Eating more fruits, vegetables, and whole grains. Make a goal to eat: 1-2 cups of fresh fruits and vegetables each day. 3-4 servings of whole grains each day. Avoiding foods and beverages that have added sugars. Eating fish that contain healthy fats (omega-3 fatty acids), such as mackerel or salmon. If you need help putting together a healthy eating plan, try the DASH diet. This diet is high in fruits, vegetables, and whole grains. It is low in sodium, red meat, and added sugars. DASH stands for Dietary Approaches to Stop Hypertension. Lifestyle changes  Lose weight if you are overweight. Losing just 3-5% of your body weight can help prevent or control hypertension. For example, if your present weight is 200 lb (91 kg), a loss of 3-5% of your weight means losing 6-10 lb (2.7-4.5 kg). Ask your health care provider to help you with a diet and exercise plan to safely lose weight. Get enough exercise. Do at least 150 minutes of moderate-intensity exercise each week. You could do this in short exercise sessions several times a day, or you could do longer exercise sessions a few times a week. For example, you could take a brisk 10-minute walk or bike ride, 3 times a day, for 5 days a week. Find ways to reduce stress, such as exercising, meditating, listening to   music, or taking a yoga class. If you need help reducing stress, ask your health care provider. Do not use any products that contain nicotine or tobacco. These products include cigarettes, chewing tobacco, and vaping devices, such as e-cigarettes. Chemicals in tobacco and nicotine products raise your  blood pressure each time you use them. If you need help quitting, ask your health care provider. Learn how to check your blood pressure at home. Make sure that you know your personal target blood pressure, as told by your health care provider. Try to sleep 7-9 hours per night. Alcohol use Do not drink alcohol if: Your health care provider tells you not to drink. You are pregnant, may be pregnant, or are planning to become pregnant. If you drink alcohol: Limit how much you have to: 0-1 drink a day for women. 0-2 drinks a day for men. Know how much alcohol is in your drink. In the [place] one drink equals one 12 oz bottle of beer (355 mL), one 5 oz glass of wine (148 mL), or one 1 oz glass of hard liquor (44 mL). Medicines In addition to diet and lifestyle changes, your health care provider may recommend medicines to help lower your blood pressure. In general: You may need to try a few different medicines to find what works best for you. You may need to take more than one medicine. Take over-the-counter and prescription medicines only as told by your health care provider. Questions to ask your health care provider What is my blood pressure goal? How can I lower my risk for high blood pressure? How should I monitor my blood pressure at home? Where to find support Your health care provider can help you prevent hypertension and help you keep your blood pressure at a healthy level. Your local hospital or your community may also provide support services and prevention programs. The American Heart Association offers an online support network at supportnetwork.heart.org Where to find more information Learn more about hypertension from: National Heart, Lung, and Blood Institute: www.nhlbi.nih.gov Centers for Disease Control and Prevention: www.cdc.gov American Academy of Family Physicians: familydoctor.org Learn more about the DASH diet from: National Heart, Lung, and Blood Institute:  www.nhlbi.nih.gov Contact a health care provider if: You think you are having a reaction to medicines you have taken. You have recurrent headaches or feel dizzy. You have swelling in your ankles. You have trouble with your vision. Get help right away if: You have sudden, severe chest, back, or abdominal pain or discomfort. You have shortness of breath. You have a sudden, severe headache. These symptoms may be an emergency. Get help right away. Call 911. Do not wait to see if the symptoms will go away. Do not drive yourself to the hospital. Summary Hypertension often does not cause any symptoms until blood pressure is very high. It is important to get your blood pressure checked regularly. Diet and lifestyle changes are important steps in preventing hypertension. By keeping your blood pressure in a healthy range, you may prevent complications like heart attack, heart failure, stroke, and kidney failure. Work with your health care provider to make a hypertension prevention plan that works for you. This information is not intended to replace advice given to you by your health care provider. Make sure you discuss any questions you have with your health care provider. Document Revised: 12/19/2020 Document Reviewed: 12/19/2020 Elsevier Patient Education  2024 Elsevier Inc.  

## 2022-12-21 NOTE — Progress Notes (Unsigned)
I,Victoria T Deloria Lair, CMA,acting as a Neurosurgeon for Gwynneth Aliment, MD.,have documented all relevant documentation on the behalf of Gwynneth Aliment, MD,as directed by  Gwynneth Aliment, MD while in the presence of Gwynneth Aliment, MD.  Subjective:  Patient ID: Gloria Chen , female    DOB: 11-23-1960 , 62 y.o.   MRN: [id number]  No chief complaint on file.   HPI  Patient presents today for evaluation of right ankle swelling and right foot pain. She reports this initially started a while ago. She has been evaluated by Podiatry; however, she did not get injection for suspected PF because she did not want her sugars to go up. She did not start meloxicam because she read it could make her sleepy. She never tried the medication to see if it actually made her sleepy.  Additionally, she did not want to take the medication for 30 days (a ten day course could have improved her symptoms as well).  SShe denies having calf pain with ambulation.   She adds, she is under a lot of stress at work. She is now having heart palpitations and headaches. Denies chest pain and SOB. Headaches are frontal and throbbing. Headaches only occur when at work.  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 She admits she is now taking baby aspirin regularly.        Past Medical History:  Diagnosis Date   Allergy    Cancer (HCC)    Constipation    Edema, lower extremity    Food allergy, peanut    Lactose intolerance .   Personal history of chemotherapy    Pre-diabetes    Swelling of right ankle joint    Vitamin D deficiency      Family History  Problem Relation Age of Onset   Hypertension Mother    Thyroid disease Mother    Heart disease Father    Hyperlipidemia Father    Hypertension Father    Stroke Father    Diabetes Father    Hypertension Sister    Diabetes Sister    Hypertension Brother    Breast cancer Maternal Aunt      Current Outpatient Medications:     fluticasone (FLONASE) 50 MCG/ACT nasal spray, SPRAY 2 SPRAYS INTO EACH NOSTRIL EVERY DAY, Disp: 48 mL, Rfl: 1   blood glucose meter kit and supplies KIT, Dispense based on patient and insurance preference. Use up to four times daily as directed. (Patient not taking: Reported on 11/12/2022), Disp: 1 each, Rfl: 0   meloxicam (MOBIC) 15 MG tablet, Take 1 tablet (15 mg total) by mouth daily. (Patient not taking: Reported on 12/21/2022), Disp: 30 tablet, Rfl: 3   Allergies  Allergen Reactions   Almond Oil    Peanut-Containing Drug Products    Soy Allergy    Wheat      Review of Systems  Constitutional: Negative.   Respiratory: Negative.    Cardiovascular: Negative.   Gastrointestinal: Negative.   Musculoskeletal:  Positive for arthralgias.  Neurological: Negative.   Psychiatric/Behavioral: Negative.       Today's Vitals   12/21/22 1233 12/21/22 1312  BP: (!) 130/90 124/86  Pulse: 77   Temp: 98.1 F (36.7 C)   SpO2: 98%   Weight: 173 lb 3.2 oz (78.6 kg)   Height: 5\' 3"  (1.6 m)    Body mass index is 30.68 kg/m.  Wt Readings from Last 3 Encounters:  12/21/22 173  lb 3.2 oz (78.6 kg)  10/15/22 173 lb 6.4 oz (78.7 kg)  08/03/22 180 lb 6.4 oz (81.8 kg)    BP Readings from Last 3 Encounters:  12/21/22 124/86  10/15/22 110/82  08/03/22 112/72      Objective:  Physical Exam Vitals and nursing note reviewed.  Constitutional:      Appearance: Normal appearance.  HENT:     Head: Normocephalic and atraumatic.  Eyes:     Extraocular Movements: Extraocular movements intact.  Cardiovascular:     Rate and Rhythm: Normal rate and regular rhythm.     Heart sounds: Normal heart sounds.  Pulmonary:     Effort: Pulmonary effort is normal.     Breath sounds: Normal breath sounds.  Musculoskeletal:     Cervical back: Normal range of motion.     Right lower leg: No edema.     Left lower leg: No edema.  Skin:    General: Skin is warm.  Neurological:     General: No focal deficit  present.     Mental Status: She is alert.  Psychiatric:        Mood and Affect: Mood normal.        Behavior: Behavior normal.         Assessment And Plan:  Plantar fasciitis of right foot Assessment & Plan: She is having persistent symptoms thus far, no definitive treatment has been started. She will continue to perform stretching exercises in hopes of getting relief.    Type 2 diabetes mellitus with obesity (HCC) -     Microalbumin / creatinine urine ratio  Palpitations Assessment & Plan: Intermittent. Possibly related to menopause. EKG performed, NSR w/ HR 69, w/ negative T-waves -Possible Inferior  ischemia. I will also check labs as below. She is encouraged to stay well hydrated and to decrease caffeine intake. I will consider Cardiology evaluation after reviewing her labs.   Orders: -     EKG 12-Lead -     Magnesium -     TSH + free T4  Elevated blood pressure reading Assessment & Plan: She is encouraged to avoid adding salt to her foods. Also encouraged to decrease intake of packaged/processed foods. She agrees to rto in 2-3 weeks for a nurse visit.    Frontal headache Assessment & Plan: Possibly due to ??mold exposure. This could also be related to elevated blood pressure reading. 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 She agrees to Allergy evaluation for mold testing.   Orders: -     Ambulatory referral to Allergy  Suspected exposure to mold -     Ambulatory referral to Allergy  She is encouraged to strive for BMI less than 30 to decrease cardiac risk. Advised to aim for at least 150 minutes of exercise per week.    Return in 2 weeks (on 01/04/2023), or [place] - bp check.  Patient was given opportunity to ask questions. Patient verbalized understanding of the plan and was able to repeat key elements of the plan. All questions were answered to their satisfaction.    I, Gwynneth Aliment,  MD, have reviewed all documentation for this visit. The documentation on 12/21/22 for the exam, diagnosis, procedures, and orders are all accurate and complete.   IF YOU HAVE BEEN REFERRED TO A SPECIALIST, IT MAY TAKE 1-2 WEEKS TO SCHEDULE/PROCESS THE REFERRAL. IF YOU HAVE NOT HEARD FROM US/SPECIALIST IN TWO WEEKS, PLEASE  GIVE [place] A CALL AT [phone].   THE PATIENT IS ENCOURAGED TO PRACTICE SOCIAL DISTANCING DUE TO THE COVID-19 PANDEMIC.

## 2022-12-21 NOTE — Assessment & Plan Note (Signed)
She is having persistent symptoms thus far, no definitive treatment has been started. She will continue to perform stretching exercises in hopes of getting relief.

## 2022-12-21 NOTE — Assessment & Plan Note (Signed)
She is encouraged to aim for at least 150 minutes of exercise per week and to resume her regular exercise regimen.

## 2022-12-21 NOTE — Assessment & Plan Note (Addendum)
Intermittent. Possibly related to menopause. EKG performed, NSR w/ HR 69, w/ negative T-waves -Possible Inferior  ischemia. I will also check labs as below. She is encouraged to stay well hydrated and to decrease caffeine intake. I will consider Cardiology evaluation after reviewing her labs.

## 2022-12-21 NOTE — Assessment & Plan Note (Signed)
She is encouraged to avoid adding salt to her foods. Also encouraged to decrease intake of packaged/processed foods. She agrees to rto in 2-3 weeks for a nurse visit.

## 2022-12-21 NOTE — Assessment & Plan Note (Signed)
Possibly due to ??mold exposure. This could also be related to elevated blood pressure reading. 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 She agrees to Allergy evaluation for mold testing.

## 2022-12-22 LAB — MICROALBUMIN / CREATININE URINE RATIO
Creatinine, Urine: 53.8 mg/dL
Microalb/Creat Ratio: 10 mg/g{creat} (ref 0–29)
Microalbumin, Urine: 5.5 ug/mL

## 2022-12-22 LAB — TSH+FREE T4
Free T4: 1.48 ng/dL (ref 0.82–1.77)
TSH: 1.22 u[IU]/mL (ref 0.450–4.500)

## 2022-12-22 LAB — MAGNESIUM: Magnesium: 2.2 mg/dL (ref 1.6–2.3)

## 2022-12-29 ENCOUNTER — Encounter: Payer: Self-pay | Admitting: Internal Medicine

## 2023-01-05 ENCOUNTER — Ambulatory Visit: Payer: BC Managed Care – PPO

## 2023-01-05 VITALS — BP 110/80 | HR 84 | Temp 98.1°F | Ht 63.0 in | Wt 173.0 lb

## 2023-01-05 DIAGNOSIS — R030 Elevated blood-pressure reading, without diagnosis of hypertension: Secondary | ICD-10-CM

## 2023-01-05 NOTE — Progress Notes (Signed)
Patient presents today for bpc. She does not take any prescribed medications for bp. Denies dizzines & chest pain. Admits headaches due her occupation.  BP Readings from Last 3 Encounters:  01/05/23 110/80  12/21/22 124/86  10/15/22 110/82  Per provider blood pressure looks great. She will follow up at November appointment. Patient aware.

## 2023-01-21 ENCOUNTER — Encounter: Payer: Self-pay | Admitting: Internal Medicine

## 2023-01-28 ENCOUNTER — Other Ambulatory Visit: Payer: Self-pay

## 2023-01-28 ENCOUNTER — Encounter: Payer: Self-pay | Admitting: Allergy & Immunology

## 2023-01-28 ENCOUNTER — Ambulatory Visit: Payer: BC Managed Care – PPO | Admitting: Allergy & Immunology

## 2023-01-28 ENCOUNTER — Ambulatory Visit: Payer: BC Managed Care – PPO | Admitting: Internal Medicine

## 2023-01-28 ENCOUNTER — Encounter: Payer: Self-pay | Admitting: Internal Medicine

## 2023-01-28 VITALS — BP 118/80 | HR 76 | Temp 98.3°F | Ht 63.0 in | Wt 169.0 lb

## 2023-01-28 VITALS — BP 124/86 | HR 76 | Temp 98.2°F | Ht 63.58 in | Wt 169.8 lb

## 2023-01-28 DIAGNOSIS — F4322 Adjustment disorder with anxiety: Secondary | ICD-10-CM | POA: Insufficient documentation

## 2023-01-28 DIAGNOSIS — E119 Type 2 diabetes mellitus without complications: Secondary | ICD-10-CM | POA: Diagnosis not present

## 2023-01-28 DIAGNOSIS — J310 Chronic rhinitis: Secondary | ICD-10-CM

## 2023-01-28 DIAGNOSIS — T781XXD Other adverse food reactions, not elsewhere classified, subsequent encounter: Secondary | ICD-10-CM

## 2023-01-28 NOTE — Assessment & Plan Note (Signed)
Diagnosed within past year. I will check labs as below. She is encouraged to notify her optometrist/ophthalmologist of diabetes diagnosis when she goes for yearly eye exams. She will f/u in 3-4 months for re-evaluation.

## 2023-01-28 NOTE — Progress Notes (Signed)
I,Victoria T Deloria Lair, CMA,acting as a Neurosurgeon for Gwynneth Aliment, MD.,have documented all relevant documentation on the behalf of Gwynneth Aliment, MD,as directed by  Gwynneth Aliment, MD while in the presence of Gwynneth Aliment, MD.  Subjective:  Patient ID: Gloria Chen , female    DOB: [birth year] , 62 y.o.   MRN: [id number]  Chief Complaint  Patient presents with   Diabetes    HPI  Patient presents today for a f/u on her diabetes. She admits making significant lifestyle changes. She has also been exercising regularly. She reports not fasting before apt today. She does not take any prescribed medication for diabetes, per her request.      Diabetes She presents for her follow-up diabetic visit. She has type 2 diabetes mellitus. Her disease course has been stable. Hypoglycemia symptoms include nervousness/anxiousness. There are no diabetic associated symptoms. Risk factors for coronary artery disease include post-menopausal. Current diabetic treatment includes diet.     Past Medical History:  Diagnosis Date   Allergy    Cancer (HCC)    Constipation    Edema, lower extremity    Food allergy, peanut    Lactose intolerance .   Personal history of chemotherapy    Pre-diabetes    Swelling of right ankle joint    Vitamin D deficiency      Family History  Problem Relation Age of Onset   Hypertension Mother    Thyroid disease Mother    Heart disease Father    Hyperlipidemia Father    Hypertension Father    Stroke Father    Diabetes Father    Hypertension Sister    Diabetes Sister    Asthma Brother    Hypertension Brother    Breast cancer Maternal Aunt      Current Outpatient Medications:    fluticasone (FLONASE) 50 MCG/ACT nasal spray, SPRAY 2 SPRAYS INTO EACH NOSTRIL EVERY DAY, Disp: 48 mL, Rfl: 1   blood glucose meter kit and supplies KIT, Dispense based on patient and insurance preference. Use up to four times daily as directed. (Patient not taking: Reported on  11/12/2022), Disp: 1 each, Rfl: 0   Allergies  Allergen Reactions   Almond Oil    Peanut-Containing Drug Products    Soy Allergy    Wheat      Review of Systems  Constitutional: Negative.   Respiratory: Negative.    Cardiovascular: Negative.   Gastrointestinal: Negative.   Neurological: Negative.   Psychiatric/Behavioral:  The patient is nervous/anxious.        She c/o worsening anxiety. She feels overwhelmed. She reports being under a great deal of stress at work. Often works six days per week. Feels as if she is never able to complete all tasks. This has affected her ability to focus at work and perform her best. This stresses her out even more. She was previously in therapy; however, she is no longer able to see chosen provider at [facility] group. She would like referral to a new provider.      Today's Vitals   01/28/23 1431  BP: 118/80  Pulse: 76  Temp: 98.3 F (36.8 C)  SpO2: 98%  Weight: 169 lb (76.7 kg)  Height: 5\' 3"  (1.6 m)   Body mass index is 29.94 kg/m.  Wt Readings from Last 3 Encounters:  01/28/23 169 lb (76.7 kg)  01/28/23 169 lb 12.8 oz (77 kg)  01/05/23 173 lb (78.5 kg)     Objective:  Physical Exam  Vitals and nursing note reviewed.  Constitutional:      Appearance: Normal appearance.  HENT:     Head: Normocephalic and atraumatic.  Eyes:     Extraocular Movements: Extraocular movements intact.  Cardiovascular:     Rate and Rhythm: Normal rate and regular rhythm.     Heart sounds: Normal heart sounds.  Pulmonary:     Effort: Pulmonary effort is normal.     Breath sounds: Normal breath sounds.  Musculoskeletal:     Cervical back: Normal range of motion.  Skin:    General: Skin is warm.  Neurological:     General: No focal deficit present.     Mental Status: She is alert.  Psychiatric:        Mood and Affect: Mood normal.        Behavior: Behavior normal.         Assessment And Plan:  Type 2 diabetes mellitus without complication, without  long-term current use of insulin (HCC) Assessment & Plan: Diagnosed within past year. I will check labs as below. She is encouraged to notify her optometrist/ophthalmologist of diabetes diagnosis when she goes for yearly eye exams. She will f/u in 3-4 months for re-evaluation.   Orders: -     Hemoglobin A1c -     BMP8+EGFR  Adjustment disorder with anxiety Assessment & Plan: She does not wish to take any prescription medication to address her symptoms. Advised to consider magnesium glycinate nightly. I will refer her to [facility] for counseling. She may have an improvement in her sx with use of magnesium glycinate. L-theanine may also be helpful. If her sx do not improve, she will likely need to go on short term disability since it is becoming difficult to function while at work. All questions were answered to her satisfaction.   Orders: -     Ambulatory referral to Psychology     Return if symptoms worsen or fail to improve.  Patient was given opportunity to ask questions. Patient verbalized understanding of the plan and was able to repeat key elements of the plan. All questions were answered to their satisfaction.    I, Gwynneth Aliment, MD, have reviewed all documentation for this visit. The documentation on 01/28/23 for the exam, diagnosis, procedures, and orders are all accurate and complete. c  IF YOU HAVE BEEN REFERRED TO A SPECIALIST, IT MAY TAKE 1-2 WEEKS TO SCHEDULE/PROCESS THE REFERRAL. IF YOU HAVE NOT HEARD FROM US/SPECIALIST IN TWO WEEKS, PLEASE GIVE [place] A CALL AT [phone].   THE PATIENT IS ENCOURAGED TO PRACTICE SOCIAL DISTANCING DUE TO THE COVID-19 PANDEMIC.

## 2023-01-28 NOTE — Patient Instructions (Addendum)
Magnesium glycinate  Type 2 Diabetes Mellitus, Diagnosis, Adult Type 2 diabetes (type 2 diabetes mellitus) is a long-term (chronic) disease. It may happen when there is one or both of these problems: The pancreas does not make enough insulin. The body does not react in a normal way to insulin that it makes. Insulin lets sugars go into cells in your body. If you have type 2 diabetes, sugars cannot get into your cells. Sugars build up in the blood. This causes high blood sugar. What are the causes? The exact cause of this condition is not known. What increases the risk? Having type 2 diabetes in your family. Being overweight or very overweight. Not being active. Your body not reacting in a normal way to the insulin it makes. Having higher than normal blood sugar over time. Having a type of diabetes when you were pregnant. Having a condition that causes small fluid-filled sacs on your ovaries. What are the signs or symptoms? At first, you may have no symptoms. You will get symptoms slowly. They may include: More thirst than normal. More hunger than normal. Needing to pee more than normal. Losing weight without trying. Feeling tired. Feeling weak. Seeing things blurry. Dark patches on your skin. How is this treated? This condition may be treated by a diabetes expert. You may need to: Follow an eating plan made by a food expert (dietitian). Get regular exercise. Find ways to deal with stress. Check blood sugar as often as told. Take medicines. Your doctor will set treatment goals for you. Your blood sugar should be at these levels: Before meals: 80-130 mg/dL (3.2-4.4 mmol/L). After meals: below 180 mg/dL (10 mmol/L). Over the last 2-3 months: less than 7%. Follow these instructions at home: Medicines Take your diabetes medicines or insulin every day. Take medicines as told to help you prevent other problems caused by this condition. You may need: Aspirin. Medicine to lower  cholesterol. Medicine to control blood pressure. Questions to ask your doctor Should I meet with a diabetes educator? What medicines do I need, and when should I take them? What will I need to treat my condition at home? When should I check my blood sugar? Where can I find a support group? Who can I call if I have questions? When is my next doctor visit? General instructions Take over-the-counter and prescription medicines only as told by your doctor. Keep all follow-up visits. Where to find more information For help and guidance and more information about diabetes, please go to: American Diabetes Association (ADA): www.diabetes.org American Association of Diabetes Care and Education Specialists (ADCES): www.diabeteseducator.org International Diabetes Federation (IDF): DCOnly.dk Contact a doctor if: Your blood sugar is at or above 240 mg/dL (01.0 mmol/L) for 2 days in a row. You have been sick for 2 days or more, and you are not getting better. You have had a fever for 2 days or more, and you are not getting better. You have any of these problems for more than 6 hours: You cannot eat or drink. You feel like you may vomit. You vomit. You have watery poop (diarrhea). Get help right away if: Your blood sugar is lower than 54 mg/dL (3 mmol/L). You feel mixed up (confused). You have trouble thinking clearly. You have trouble breathing. You have medium or large ketone levels in your pee. These symptoms may be an emergency. Get help right away. Call your local emergency services (911 in the [place] Do not wait to see if the symptoms will go away. Do  not drive yourself to the hospital. Summary Type 2 diabetes is a long-term disease. Your pancreas may not make enough insulin, or your body may not react in a normal way to insulin that it makes. This condition is treated with an eating plan, lifestyle changes, and medicines. Your doctor will set treatment goals for you. These will help  you keep your blood sugar in a healthy range. Keep all follow-up visits. This information is not intended to replace advice given to you by your health care provider. Make sure you discuss any questions you have with your health care provider. Document Revised: 05/27/2020 Document Reviewed: 05/27/2020 Elsevier Patient Education  2024 [employer].

## 2023-01-28 NOTE — Progress Notes (Signed)
NEW PATIENT  Date of Service/Encounter:  01/28/23  Consult requested by: Dorothyann Peng, MD   Assessment:   Chronic rhinitis - planning for testing at the next visit  Possible mold exposure  Multiple food intolerances - unclear diagnostic modality (but no history consistent with anaphylaxis)  Plan/Recommendations:   1. Chronic rhinitis - Because of insurance stipulations, we cannot do skin testing on the same day as your first visit. - We are all working to fight this, but for now we need to do two separate visits.  - We will know more after we do testing at the next visit.  - The skin testing visit can be squeezed in at your convenience.  - Then we can make a more full plan to address all of your symptoms.  2. Food intolerances  - Continue to avoid all of your triggering foods as you are doing. - No EpiPen needed since these reactions are not anaphylactic in nature.   3. Return in about 1 week (around 02/04/2023) for SKIN TESTING (1-55).    This note in its entirety was forwarded to the Provider who requested this consultation.  Subjective:   Gloria Chen is a 62 y.o. female presenting today for evaluation of  Chief Complaint  Patient presents with   Allergic Rhinitis     She thinks it could mold exposure.   Headache    Gloria Chen has a history of the following: Patient Active Problem List   Diagnosis Date Noted   Plantar fasciitis of right foot 12/21/2022   Palpitations 12/21/2022   Elevated blood pressure reading 12/21/2022   Frontal headache 12/21/2022   Right foot pain 10/26/2022   Diabetes mellitus without complication (HCC) 04/16/2022   Keratosis pilaris 04/16/2022   Multiple thyroid nodules 02/16/2022   Right elbow pain 04/07/2021   Class 1 obesity due to excess calories without serious comorbidity with body mass index (BMI) of 30.0 to 30.9 in adult 02/16/2019   Bronchitis 02/23/2018   History of breast cancer in female 10/17/2014    History  obtained from: chart review and patient.  Discussed the use of AI scribe software for clinical note transcription with the patient and/or guardian, who gave verbal consent to proceed.  Gloria Chen was referred by Dorothyann Peng, MD.     Gloria Chen is a 62 y.o. female presenting for an evaluation of possible environmental allergies, specifically to mold .  Discussed the use of AI scribe software for clinical note transcription with the patient, who gave verbal consent to proceed.  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 Previously, she was at a newer school and had none of these issues.  Allergic Rhinitis Symptom History: Ota with chronic headaches and malaise that began after switching schools in August. She describes an odor in her new classroom, initially thought to be sewage, which she believes is causing her symptoms. The patient reports feeling better on weekends when not in the classroom, suggesting an environmental trigger. She has consulted her primary care physician, who suggested a possible mold allergy. The patient has a history of seasonal allergies, typically manifesting as hay fever, which she manages with Flonase as needed.  She has never seen ENT and has never had any sinus surgeries.   Food Allergy Symptom History: Ameilia also reports food intolerances to green beans, soy, eggs, peanuts, and blue cheese, identified through blood work a few years ago. Consumption of these foods reportedly  leads to nasal congestion but no anaphylactic reactions. The patient denies any history of asthma, eczema, or other allergies. She has not been previously tested for environmental allergies.  She does not have an EpiPen.   Otherwise, there is no history of other atopic diseases, including drug allergies, stinging insect allergies, or contact dermatitis. There is no significant infectious history. Vaccinations are up to date.    Past Medical  History: Patient Active Problem List   Diagnosis Date Noted   Plantar fasciitis of right foot 12/21/2022   Palpitations 12/21/2022   Elevated blood pressure reading 12/21/2022   Frontal headache 12/21/2022   Right foot pain 10/26/2022   Diabetes mellitus without complication (HCC) 04/16/2022   Keratosis pilaris 04/16/2022   Multiple thyroid nodules 02/16/2022   Right elbow pain 04/07/2021   Class 1 obesity due to excess calories without serious comorbidity with body mass index (BMI) of 30.0 to 30.9 in adult 02/16/2019   Bronchitis 02/23/2018   History of breast cancer in female 10/17/2014    Medication List:  Allergies as of 01/28/2023       Reactions   Almond Oil    Peanut-containing Drug Products    Soy Allergy    Wheat         Medication List        Accurate as of January 28, 2023  9:43 AM. If you have any questions, ask your nurse or doctor.          blood glucose meter kit and supplies Kit Dispense based on patient and insurance preference. Use up to four times daily as directed.   fluticasone 50 MCG/ACT nasal spray Commonly known as: FLONASE SPRAY 2 SPRAYS INTO EACH NOSTRIL EVERY DAY   meloxicam 15 MG tablet Commonly known as: Mobic Take 1 tablet (15 mg total) by mouth daily.        Birth History: non-contributory  Developmental History: non-contributory  Past Surgical History: Past Surgical History:  Procedure Laterality Date   BREAST SURGERY     MASTECTOMY Left 1997     Family History: Family History  Problem Relation Age of Onset   Hypertension Mother    Thyroid disease Mother    Heart disease Father    Hyperlipidemia Father    Hypertension Father    Stroke Father    Diabetes Father    Hypertension Sister    Diabetes Sister    Asthma Brother    Hypertension Brother    Breast cancer Maternal Aunt      Social History: Phinley lives at home with her husband.  She has a 26 year old son.  She moved here years ago from [place].   They live in a house that is 60 years old.  There is engineered wood and carpeting throughout the home.  There is a dog inside of the home.  There are no dust mite covers on the bedding.  There is no tobacco exposure.  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  There is no fume, chemical, or dust at home.  There might be some exposures at school.  She denies any visible mold.  She does not have a HEPA filter in her home.  She does not live near an interstate or industrial area.  She is not a smoker.   Review of systems otherwise negative other than that mentioned in the HPI.    Objective:   Blood pressure 124/86, pulse 76, temperature 98.2 F (36.8 C), temperature source Temporal, height 5' 3.58" (1.615  m), weight 169 lb 12.8 oz (77 kg), last menstrual period 03/16/2000, SpO2 97%. Body mass index is 29.53 kg/m.     Physical Exam Vitals reviewed.  Constitutional:      Appearance: She is well-developed.     Comments: Smiling.  Pleasant.  HENT:     Head: Normocephalic and atraumatic.     Right Ear: Tympanic membrane, ear canal and external ear normal. No drainage, swelling or tenderness. Tympanic membrane is not injected, scarred, erythematous, retracted or bulging.     Left Ear: Tympanic membrane, ear canal and external ear normal. No drainage, swelling or tenderness. Tympanic membrane is not injected, scarred, erythematous, retracted or bulging.     Nose: Mucosal edema and rhinorrhea present. No nasal deformity or septal deviation.     Right Turbinates: Enlarged, swollen and pale.     Left Turbinates: Enlarged, swollen and pale.     Right Sinus: No maxillary sinus tenderness or frontal sinus tenderness.     Left Sinus: No maxillary sinus tenderness or frontal sinus tenderness.     Mouth/Throat:     Lips: Pink.     Mouth: Mucous membranes are moist. Mucous membranes are not pale and not dry.     Pharynx: Uvula midline.     Comments: Mild cobblestoning. Eyes:     General:        Right eye: No  discharge.        Left eye: No discharge.     Conjunctiva/sclera: Conjunctivae normal.     Right eye: Right conjunctiva is not injected. No chemosis.    Left eye: Left conjunctiva is not injected. No chemosis.    Pupils: Pupils are equal, round, and reactive to light.  Cardiovascular:     Rate and Rhythm: Normal rate and regular rhythm.     Heart sounds: Normal heart sounds.  Pulmonary:     Effort: Pulmonary effort is normal. No tachypnea, accessory muscle usage or respiratory distress.     Breath sounds: Normal breath sounds. No wheezing, rhonchi or rales.     Comments: Moving air well in all lung fields.  No increased work of breathing. Chest:     Chest wall: No tenderness.  Abdominal:     Tenderness: There is no abdominal tenderness. There is no guarding or rebound.  Lymphadenopathy:     Head:     Right side of head: No submandibular, tonsillar or occipital adenopathy.     Left side of head: No submandibular, tonsillar or occipital adenopathy.     Cervical: No cervical adenopathy.  Skin:    Coloration: Skin is not pale.     Findings: No abrasion, erythema, petechiae or rash. Rash is not papular, urticarial or vesicular.  Neurological:     Mental Status: She is alert.  Psychiatric:        Behavior: Behavior is cooperative.      Diagnostic studies: deferred due to insurance stipulations that require a separate visit for testing         Malachi Bonds, MD [facility]

## 2023-01-28 NOTE — Patient Instructions (Addendum)
1. Chronic rhinitis - Because of insurance stipulations, we cannot do skin testing on the same day as your first visit. - We are all working to fight this, but for now we need to do two separate visits.  - We will know more after we do testing at the next visit.  - The skin testing visit can be squeezed in at your convenience.  - Then we can make a more full plan to address all of your symptoms.  2. Food intolerances  - Continue to avoid all of your triggering foods as you are doing. - No EpiPen needed since these reactions are not anaphylactic in nature.   3. Return in about 1 week (around 02/04/2023) for SKIN TESTING (1-55).    Please inform us of any Emergency Department visits, hospitalizations, or changes in symptoms. Call us before going to the ED for breathing or allergy symptoms since we might be able to fit you in for a sick visit. Feel free to contact us anytime with any questions, problems, or concerns.  It was a pleasure to meet you today!  Websites that have reliable patient information: 1. American Academy of Asthma, Allergy, and Immunology: www.aaaai.org 2. Food Allergy Research and Education (FARE): foodallergy.org 3. Mothers of Asthmatics: http://www.asthmacommunitynetwork.org 4. American College of Allergy, Asthma, and Immunology: www.acaai.org      "Like" [place] on Facebook and Instagram for our latest updates!      A healthy democracy works best when [employer] participate! Make sure you are registered to vote! If you have moved or changed any of your contact information, you will need to get this updated before voting! Scan the QR codes below to learn more!

## 2023-01-28 NOTE — Assessment & Plan Note (Signed)
She does not wish to take any prescription medication to address her symptoms. Advised to consider magnesium glycinate nightly. I will refer her to [facility] for counseling. She may have an improvement in her sx with use of magnesium glycinate. L-theanine may also be helpful. If her sx do not improve, she will likely need to go on short term disability since it is becoming difficult to function while at work. All questions were answered to her satisfaction.

## 2023-01-29 ENCOUNTER — Other Ambulatory Visit: Payer: Self-pay | Admitting: Student

## 2023-01-29 ENCOUNTER — Ambulatory Visit: Payer: Self-pay

## 2023-01-29 DIAGNOSIS — S66911A Strain of unspecified muscle, fascia and tendon at wrist and hand level, right hand, initial encounter: Secondary | ICD-10-CM

## 2023-01-29 DIAGNOSIS — M533 Sacrococcygeal disorders, not elsewhere classified: Secondary | ICD-10-CM

## 2023-01-29 LAB — BMP8+EGFR
BUN/Creatinine Ratio: 18 (ref 12–28)
BUN: 11 mg/dL (ref 8–27)
CO2: 24 mmol/L (ref 20–29)
Calcium: 10.4 mg/dL — ABNORMAL HIGH (ref 8.7–10.3)
Chloride: 104 mmol/L (ref 96–106)
Creatinine, Ser: 0.6 mg/dL (ref 0.57–1.00)
Glucose: 92 mg/dL (ref 70–99)
Potassium: 4.7 mmol/L (ref 3.5–5.2)
Sodium: 141 mmol/L (ref 134–144)
eGFR: 103 mL/min/{1.73_m2} (ref >59)

## 2023-01-29 LAB — HEMOGLOBIN A1C
Est. average glucose Bld gHb Est-mCnc: 126 mg/dL
Hgb A1c MFr Bld: 6 % — ABNORMAL HIGH (ref 4.8–5.6)

## 2023-01-31 ENCOUNTER — Encounter: Payer: Self-pay | Admitting: Internal Medicine

## 2023-01-31 ENCOUNTER — Other Ambulatory Visit: Payer: Self-pay | Admitting: Internal Medicine

## 2023-01-31 MED ORDER — TRAMADOL HCL 50 MG PO TABS: 50.0000 mg | ORAL_TABLET | Freq: Two times a day (BID) | ORAL | 0 refills | Status: DC | PRN

## 2023-01-31 NOTE — Telephone Encounter (Signed)
Pt called on call service. She c/o wrist pain. S/p fall at school. She was seen at [facility]. She had x-rays performed, no fracture. Advised to take Tylenol and Motrin. She has not had any relief of her sx. Would like meds for pain relief. Will send rx tramadol to take every 12 hours as needed. Advised this may make her drowsy. Advised her to follow up with workmen's comp if she has further complications. May need Ortho evaluation. Advised she can take Tylenol 500mg  with tramadol every 12 hours. May also take Motrin/ibuprofen 600mg   in between tramadol dosing. Motrin/ibuprofen should be taken with food. Advised to wear sleeve/brace as tolerated. May also benefit from topical Voltaren gel if Motrin is not tolerated.   RS

## 2023-02-03 ENCOUNTER — Ambulatory Visit: Payer: BC Managed Care – PPO | Admitting: Orthopaedic Surgery

## 2023-02-04 ENCOUNTER — Ambulatory Visit: Payer: BC Managed Care – PPO | Admitting: Allergy & Immunology

## 2023-02-16 ENCOUNTER — Ambulatory Visit: Payer: BC Managed Care – PPO | Admitting: Allergy & Immunology

## 2023-02-18 ENCOUNTER — Telehealth: Payer: Self-pay | Admitting: Internal Medicine

## 2023-02-18 ENCOUNTER — Encounter: Payer: Self-pay | Admitting: Internal Medicine

## 2023-02-18 ENCOUNTER — Ambulatory Visit: Payer: BC Managed Care – PPO | Admitting: Internal Medicine

## 2023-02-18 VITALS — BP 126/70 | HR 76 | Ht 63.0 in | Wt 173.8 lb

## 2023-02-18 DIAGNOSIS — R030 Elevated blood-pressure reading, without diagnosis of hypertension: Secondary | ICD-10-CM | POA: Diagnosis not present

## 2023-02-18 DIAGNOSIS — E042 Nontoxic multinodular goiter: Secondary | ICD-10-CM

## 2023-02-18 NOTE — Patient Instructions (Addendum)
Please send me a message in January to order a new thyroid U/S.  Please come back for a follow-up appointment in 1 year.

## 2023-02-18 NOTE — Progress Notes (Signed)
Patient ID: Gloria Chen, female   DOB: 1960-05-27, 62 y.o.   MRN: [id number]  HPI  Gloria Chen is a 62 y.o.-year-old female, initially referred by her PCP, Dr. Allyne Gee, returning for follow-up for thyroid nodules.  Last visit 1 year ago.  Interim history: She feels well since last visit without complaints today. No neck discomfort or problems swallowing. She had a fall  - on her tailbone. She has pain at sitting down. Also, a wrist sprain.  Reviewed and addended history: Patient has a long history of multiple thyroid nodules - found by palpation by her ObGyn provider in the past.  She was found to have fullness in her neck again on palpation this year (Dr. Osborn Coho) and then saw PCP (Dr. Dorothyann Peng) >> a new thyroid U/S was ordered >> she had multiple small thyroid nodules but also a new, larger, dominant nodule >> Bx: inconclusive. She was referred to endocrinology.  Thyroid U/S (11/14/2003):  Ultrasound of the thyroid gland was performed.  The right lobe measures 6.0 x 1.9 x 2.3 cm.  The left lobe measures 5.8 x 1.5 x 1.6 cm.  Thyroid isthmus is 4-5 mm in thickness.    Overall the thyroid parenchymal echotexture is homogeneous.  There are three discrete noncystic lesions identified, one in the lateral aspect of the right lobe measuring 9 mm, a more central lesion in the medial aspect of the right lobe measuring 8 mm, and a medial left lobe hypoechoic nodule measuring 7 mm.    IMPRESSION  Three tiny hypoechoic nodules in the thyroid parenchyma are most likely benign, but ultrasound features are nonspecific. A followup ultrasound in three to six months may be helpful to ensure that they remain stable.   Thyroid U/S (03/24/2004): The right thyroid lobe measures 5.5 x 1.8 x 2.0  The left lobe measures 5.2 x 1.4 x 1.5 cm.  The isthmus measures .36 cm.  The overall echogenicity of the thyroid gland is homogeneous.  There are multiple bilateral thyroid nodules. These appear stable.  No dominant worrisome nodule is seen.    IMPRESSION:  1. Multinodular goiter. No change in multiple small thyroid nodules. Recommend sonographic surveillance with a followup study in one year.    Thyroid U/S (12/22/2006): The right thyroid lobe measures 4.4 x 1.7 x 1.7 cm.   The left lobe measures 4.8 x 1.4 x 1.6 cm.   The isthmus measures 3 mm in thickness.   The patient has multiple bilateral thyroid nodules ranging in size from about 4 mm  to 9 mm. There is no substantial interval change.  No definite progressive  intrathyroid nodule is identified.     Cranial to the right thyroid lobe, the sonographer has identified a 2.2 x 1.1 cm  complex cystic lesion which demonstrates peripheral blood flow.     IMPRESSION:  Numerous bilateral thyroid nodules compatible with multinodular goiter. There is  no substantial interval change in appearance of the thyroid gland.     New 2.2 cm complex cystic lesion in the soft tissues just cranial to the right  thyroid lobe near the midline. A necrotic lymph node could have this appearance.  Thyroglossal duct cyst would also be a consideration. Clinical correlation is  recommended and CT neck with contrast is suggested to further evaluate.   CT neck (01/17/2007):  There are findings of a multinodular goiter.  Anterior to the superior pole of the right thyroid lobe, there is a 0.6 x 1.4 x 1.6 cm  focus which is compatible with a pyramidal lobe of the thyroid gland.  Within the pyramidal lobe, there is a 3 x 8 x 10 mm fluid-filled focus which I feel represents the residua of the mass noted on ultrasound.  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  The small low density focus is circumscribed by tissue which has an identical appearance to the thyroid gland.  There is a small linear extension off of the pyramidal lobe extending superiorly towards the hyoid bone.  The focus is causing focal  anterior displacement of the right strap muscles.  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   Thyroid U/S (01/15/2021): Parenchymal Echotexture: Moderately heterogenous  Isthmus: Normal in size measuring 0.5 cm in diameter  Right lobe: Borderline enlarged measuring 5.2 x 2.0 x 2.1 cm, previously, 4.4 x 1.7 x 1.7  Left lobe: Normal in size measuring 4.8 x 1.4 x 1.6 cm, previously, 4.8 x 1.4 x 1.6 cm _________________________________________________________   Estimated total number of nodules >/= 1 cm: 6-10 _________________________________________________________   There is an approximately 1.1 x 0.9 x 0.4 cm hypoechoic nodule within mid aspect of the right lobe of the thyroid (labeled 1), which is unchanged compared to the 12/2006 examination, previously, 1.0 cm. Imaging stability for greater than 5 years is indicative benign etiology. _________________________________________________________   Nodule # 2:  Location: Right; Mid (not seen on the 12/2006 examination)  Maximum size: 1.9 cm; Other 2 dimensions: 1.3 x 1.0 cm  Composition: solid/almost completely solid (2)  Echogenicity: hypoechoic (2)  **Given size (>/= 1.5 cm) and appearance, fine needle aspiration of this moderately suspicious nodule should be considered based on TI-RADS criteria.  _________________________________________________________   There is an approximately 1.5 x 1.0 x 0.7 cm anechoic cyst within mid aspect the right lobe of the thyroid (labeled 3) which has reduced in size compared to the 2008 examination, previously, 2.2 x 1.7 x 1.1 cm, and again does not meet criteria to recommend percutaneous sampling or continued dedicated follow-up.   There is an approximately 1.2 x 1.0 x 0.4 cm spongiform/benign-appearing nodule within the mid, medial  aspect of the right lobe of the thyroid (labeled 4), which is unchanged to slightly increased in size compared to the 2008 examination, previously, 0.8 cm, however again does not meet criteria to recommend percutaneous sampling or continued dedicated follow-up.  _________________________________________________________   There is an approximately 1.2 x 1 x 1 x 0.7 cm spongiform/benign-appearing nodule within the superior pole of the left lobe of the thyroid (labeled 5) which has increased in size compared to the 12/2006 examination for previously, 0.6 cm, however does not meet criteria to recommend percutaneous sampling or continued dedicated follow-up given its spongiform/benign appearance.   There is a punctate (approximately 0.7 cm spongiform/benign-appearing nodule within mid aspect the left lobe of the thyroid (labeled 6), which does not meet criteria to recommend percutaneous sampling or continued dedicated follow-up.   There is an approximately 1.2 x 1.0 x 0.6 cm spongiform/benign-appearing nodule within the mid aspect the left lobe of the thyroid (labeled 7), which has increased in size compared to the 12/2006 examination heart previously, 0.6 cm, however does not meet criteria to recommend percutaneous sampling or continued dedicated follow-up given its spongiform/benign appearance.   There is  an approximately 0.7 cm spongiform/benign-appearing nodule within the inferior pole the left lobe of the thyroid (labeled 8), not seen on the 2008 examination though does not meet criteria to recommend percutaneous sampling or continued dedicated follow-up.   IMPRESSION: 1. Borderline thyromegaly with findings suggestive of multinodular goiter. 2. Nodule #2, not seen on the 2008 examination, meets imaging criteria to recommend percutaneous sampling as indicated. 3. None of the remaining thyroid nodules, the majority of which appear spongiform/benign meet imaging criteria to  recommend percutaneous sampling or continued dedicated follow-up.  FNA of the right dominant thyroid nodule (02/11/2021): A. 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 (TC:tc)  Smears:6  Concentration Method (ThinPrep): 1  Cell Block: Cell block attempted but not obtained.  Additional Studies: Afirma Collected.   Afirma results returned benign (risk of malignancy approximately 4%).    Thyroid ultrasound (04/16/2022): Parenchymal Echotexture: Moderately heterogenous  Isthmus: 0.3 cm  Right lobe: 5.2 cm x 2.0 cm x 2.4 cm  Left lobe: 4.8 cm x 1.3 cm x 1.6 cm  _________________________________________________________   Estimated total number of nodules >/= 1 cm: 6-10 _________________________________________________________   Nodule labeled 1 superior right thyroid, 1.0 cm, previously 1.1 cm. Nodule has remained stable for greater than 5 years and discontinuing surveillance is reasonable.   Nodule labeled 2, mid right thyroid (previously 3), 1.8 cm with cystic characteristics. Nodule does not meet criteria for surveillance.   Nodule labeled 3, mid right thyroid (previously 2 and previously biopsied), 1.6 cm. Assuming a prior negative result on biopsy, no further specific follow-up is indicated.   Nodule labeled 4, inferior right thyroid, 1.1 cm. Nodule remains unchanged greater than 5 years and does not meet criteria for further surveillance.   Nodule labeled 5, inferior right thyroid, 1.1 cm. Nodule has spongiform characteristics on prior ultrasound and remains unchanged for greater than 5 years. Nodule does not meet criteria for surveillance.   Nodule labeled 6, mid left thyroid, 1.2 cm. Nodule  is unchanged in size with spongiform characteristics and does not meet criteria for surveillance.   Nodule labeled 7, inferior left thyroid, 1.3 cm. Nodule has spongiform characteristics on previous ultrasound and current and remains unchanged over 5 years. Nodule does not meet criteria for surveillance.   No adenopathy.   IMPRESSION: Multinodular thyroid is unchanged, as above.   Pt denies: - feeling nodules in neck - hoarseness - dysphagia - choking  I reviewed pt's thyroid tests - normal: Lab Results  Component Value Date   TSH 1.220 12/21/2022   TSH 0.668 04/16/2022   TSH 0.75 02/18/2021   TSH 1.260 02/01/2019   FREET4 1.48 12/21/2022   FREET4 1.47 02/01/2019    + FH of thyroid ds. - in mother: on LT4 (? ThyCA). No h/o radiation tx to head or neck. No steroid use. No herbal supplements. No Biotin supplements or Hair, Skin and Nails vitamins.   Pt also has a history of BrCA - in remission, prediabetes.  ROS: + See HPI  Past Medical History:  Diagnosis Date   Allergy    Cancer (HCC)    Constipation    Edema, lower extremity    Food allergy, peanut    Lactose intolerance .   Personal history  of chemotherapy    Pre-diabetes    Swelling of right ankle joint    Vitamin D deficiency    Past Surgical History:  Procedure Laterality Date   BREAST SURGERY     MASTECTOMY Left 1997   Social History   Socioeconomic History   Marital status: Married    Spouse name: Jillyn Hidden   Number of children: 1   Years of education: Not on file   Highest education level: Not on file  Occupational History   Occupation: Runner, broadcasting/film/video - [employer]  Tobacco Use   Smoking status: Never    Passive exposure: Never   Smokeless tobacco: Never  Vaping Use   Vaping status: Never Used  Substance and Sexual Activity   Alcohol use: No   Drug use: No   Sexual activity: Yes    Partners: Male    Birth control/protection: Post-menopausal  Other Topics Concern   Not on file  Social History  Narrative   Not on file   Social Determinants of Health   Financial Resource Strain: Not on file  Food Insecurity: Not on file  Transportation Needs: Not on file  Physical Activity: Not on file  Stress: Not on file  Social Connections: Not on file  Intimate Partner Violence: Not on file   Current Outpatient Medications on File Prior to Visit  Medication Sig Dispense Refill   blood glucose meter kit and supplies KIT Dispense based on patient and insurance preference. Use up to four times daily as directed. (Patient not taking: Reported on 11/12/2022) 1 each 0   fluticasone (FLONASE) 50 MCG/ACT nasal spray SPRAY 2 SPRAYS INTO EACH NOSTRIL EVERY DAY 48 mL 1   traMADol (ULTRAM) 50 MG tablet Take 1 tablet (50 mg total) by mouth every 12 (twelve) hours as needed for severe pain (pain score 7-10). 20 tablet 0   No current facility-administered medications on file prior to visit.   Allergies  Allergen Reactions   Almond Oil    Peanut-Containing Drug Products    Soy Allergy    Wheat    Family History  Problem Relation Age of Onset   Hypertension Mother    Thyroid disease Mother    Heart disease Father    Hyperlipidemia Father    Hypertension Father    Stroke Father    Diabetes Father    Hypertension Sister    Diabetes Sister    Asthma Brother    Hypertension Brother    Breast cancer Maternal Aunt    PE: BP (!) 140/100   Pulse 76   Ht 5\' 3"  (1.6 m)   Wt 173 lb 12.8 oz (78.8 kg)   LMP 03/16/2000   SpO2 98%   BMI 30.79 kg/m  Wt Readings from Last 3 Encounters:  02/18/23 173 lb 12.8 oz (78.8 kg)  01/28/23 169 lb (76.7 kg)  01/28/23 169 lb 12.8 oz (77 kg)   Constitutional: overweight, in NAD Eyes: EOMI, no exophthalmos ENT: + Mild fulness R lower cervical region, no thyroid nodules palpated, no cervical lymphadenopathy Cardiovascular: RRR, No MRG Respiratory: CTA B Musculoskeletal: no deformities Skin: no rashes Neurological: no tremor with outstretched  hands  ASSESSMENT: 1. Thyroid nodules  2.  High blood pressure reading  PLAN: 1. Thyroid nodules -Patient with a history of thyroid nodules per review of the images and report of her thyroid ultrasounds going back to 2005.  The dominant nodules were not larger and they did not have concerning features with the exception of the right 1.9  cm thyroid nodule, which was hypoechoic. -He has no family history of thyroid cancer or personal history of radiation therapy to head or neck to increase her own risk of cancer -At today's visit we again reviewed the biopsy of the dominant right thyroid nodule and this showed Hurthle cell lesion or neoplasm.  We discussed about the difference between the 2 entities.  In her case, this was classified at [facility] category 4 and I explained that this was an indeterminant result, with the risk of cancer closer to 15 to 30%. The Afirma molecular marker returned benign, which was reassuring (less than 4% risk of thyroid cancer). -After last visit, we checked another thyroid ultrasound(04/16/2022) this was unchanged -we again discussed that thyroid cancer has a good prognosis and an indolent course.  Hurthle cell cancer in particular has an increased propensity for vascular metastasis compared to the regular thyroid cancer so at today's visit we discussed about repeating her thyroid ultrasound a year from the previous (per her preference) and possibly repeating the biopsy to confirm benignity. -She continues to have fullness in the right lower cervical area, but less prominent than before.  No neck compression symptoms. -I will see her back in a year.  2.  High blood pressure reading -On arrival, blood pressure was 140/100.  Patient mentions that she rushed over here. -We repeated her blood pressure check with a larger cuff and the reading was normal, at 126/80. -Patient does not have hypertension and is not on antihypertensive medications.  She does have a family history  of the condition -No intervention needed for now, but will keep an eye on her blood pressure at next visits.  Carlus Pavlov, MD PhD [facility]

## 2023-02-22 ENCOUNTER — Other Ambulatory Visit: Payer: BC Managed Care – PPO

## 2023-02-23 ENCOUNTER — Ambulatory Visit: Payer: BC Managed Care – PPO | Admitting: Allergy & Immunology

## 2023-03-03 ENCOUNTER — Ambulatory Visit (INDEPENDENT_AMBULATORY_CARE_PROVIDER_SITE_OTHER): Payer: BC Managed Care – PPO

## 2023-03-03 ENCOUNTER — Ambulatory Visit (INDEPENDENT_AMBULATORY_CARE_PROVIDER_SITE_OTHER): Payer: Worker's Compensation | Admitting: Orthopaedic Surgery

## 2023-03-03 DIAGNOSIS — S63501A Unspecified sprain of right wrist, initial encounter: Secondary | ICD-10-CM

## 2023-03-03 DIAGNOSIS — R102 Pelvic and perineal pain: Secondary | ICD-10-CM | POA: Diagnosis not present

## 2023-03-03 DIAGNOSIS — M62462 Contracture of muscle, left lower leg: Secondary | ICD-10-CM

## 2023-03-03 DIAGNOSIS — M2141 Flat foot [pes planus] (acquired), right foot: Secondary | ICD-10-CM

## 2023-03-03 DIAGNOSIS — M722 Plantar fascial fibromatosis: Secondary | ICD-10-CM | POA: Diagnosis not present

## 2023-03-03 DIAGNOSIS — M62461 Contracture of muscle, right lower leg: Secondary | ICD-10-CM

## 2023-03-03 MED ORDER — DICLOFENAC SODIUM 75 MG PO TBEC
75.0000 mg | DELAYED_RELEASE_TABLET | Freq: Two times a day (BID) | ORAL | 2 refills | Status: DC | PRN
Start: 2023-03-03 — End: 2024-01-10

## 2023-03-03 NOTE — Progress Notes (Signed)
Office Visit Note   Patient: Gloria Chen           Date of Birth: 1961-01-01           MRN: [id number] Visit Date: 03/03/2023              Requested by: Dorothyann Peng, MD [address] [place],  [place] [address] PCP: Dorothyann Peng, MD   Assessment & Plan: Visit Diagnoses:  1. Sprain of right wrist, initial encounter   2. Pelvic pain     Plan: Impression is right wrist pain and sacrum pain following an acute fall that is being filed under work comp.  Fortunately, there is no evidence of fracture on x-ray.  Will continue to treat this symptomatically.  She will wear her wrist splint as needed.  I sent in prescription anti-inflammatories.  She will follow-up with [place] as needed.  Work note provided today.  Call with concerns or questions.  Follow-Up Instructions: Return if symptoms worsen or fail to improve.   Orders:  No orders of the defined types were placed in this encounter.  Meds ordered this encounter  Medications   diclofenac (VOLTAREN) 75 MG EC tablet    Sig: Take 1 tablet (75 mg total) by mouth 2 (two) times daily as needed.    Dispense:  60 tablet    Refill:  2      Procedures: No procedures performed   Clinical Data: No additional findings.   Subjective: Chief Complaint  Patient presents with   Right Wrist - Pain   Spine - Pain    HPI patient is a pleasant 62 year old right-hand-dominant female who comes in today following an injury to her right wrist and sacrum.  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  Currently being filed under work comp.  She was seen following the injury where her right wrist and sacrum were x-rayed.  Both were unremarkable for fracture.  She was placed in a right thumb spica splint.  She was initially out of school for 2 weeks following the injury where her symptoms improved.  When she returned to school her symptoms returned.  In regards to the right hand, the pain is along  the first dorsal compartment and into the [facility] joint.  Pain occurs primarily with any movement of the wrist as well as at night when she is trying to sleep as she has a hard time getting comfortable.  She does get some improvement wearing the wrist splint.  She has been taking Tylenol and Motrin during the day as well as tramadol at night without significant relief.  She also tells me she has paresthesias that wake her at night.  No history of carpal tunnel syndrome.  In regards to her sacrum, she is having pain to the lower part of the lumbar spine and into the buttock on both sides.  Pain is aggravated with sitting.  She has been using a doughnut which minimally improves her symptoms.  She denies any paresthesias to either lower extremity.  Review of Systems as detailed in HPI.  All others reviewed and are negative.   Objective: Vital Signs: LMP 03/16/2000   Physical Exam well-developed well-nourished female no acute distress.  Alert and oriented x 3.  Ortho Exam right wrist exam: Tenderness to palpation along the first dorsal compartment and into the [facility] joint.  He does have pain with grind test.  Pain with range of motion of  the wrist.  No ulnar-sided tenderness.  She is neurovascularly intact distally.  Specialty Comments:  No specialty comments available.  Imaging: No new imaging   PMFS History: Patient Active Problem List   Diagnosis Date Noted   Adjustment disorder with anxiety 01/28/2023   Plantar fasciitis of right foot 12/21/2022   Palpitations 12/21/2022   Elevated blood pressure reading 12/21/2022   Frontal headache 12/21/2022   Right foot pain 10/26/2022   Type 2 diabetes mellitus without complication, without long-term current use of insulin (HCC) 04/16/2022   Keratosis pilaris 04/16/2022   Multiple thyroid nodules 02/16/2022   Right elbow pain 04/07/2021   Class 1 obesity due to excess calories without serious comorbidity with body mass index (BMI) of 30.0 to 30.9 in  adult 02/16/2019   Bronchitis 02/23/2018   History of breast cancer in female 10/17/2014   Past Medical History:  Diagnosis Date   Allergy    Cancer (HCC)    Constipation    Edema, lower extremity    Food allergy, peanut    Lactose intolerance .   Personal history of chemotherapy    Pre-diabetes    Swelling of right ankle joint    Vitamin D deficiency     Family History  Problem Relation Age of Onset   Hypertension Mother    Thyroid disease Mother    Heart disease Father    Hyperlipidemia Father    Hypertension Father    Stroke Father    Diabetes Father    Hypertension Sister    Diabetes Sister    Asthma Brother    Hypertension Brother    Breast cancer Maternal Aunt     Past Surgical History:  Procedure Laterality Date   BREAST SURGERY     MASTECTOMY Left 1997   Social History   Occupational History   Occupation: Runner, broadcasting/film/video - [employer]  Tobacco Use   Smoking status: Never    Passive exposure: Never   Smokeless tobacco: Never  Vaping Use   Vaping status: Never Used  Substance and Sexual Activity   Alcohol use: No   Drug use: No   Sexual activity: Yes    Partners: Male    Birth control/protection: Post-menopausal

## 2023-03-03 NOTE — Progress Notes (Signed)
Patient presents today to pick up custom molded foot orthotics, diagnosed with PF, Pes Planus  by Dr. Lilian Kapur.   Orthotics were dispensed and fit was satisfactory. Reviewed instructions for break-in and wear. Written instructions given to patient.  Patient will follow up as needed. Addison Bailey Cped CFo, CFm

## 2023-03-04 ENCOUNTER — Ambulatory Visit: Payer: BC Managed Care – PPO | Admitting: Allergy & Immunology

## 2023-03-04 DIAGNOSIS — J302 Other seasonal allergic rhinitis: Secondary | ICD-10-CM | POA: Diagnosis not present

## 2023-03-04 DIAGNOSIS — J3089 Other allergic rhinitis: Secondary | ICD-10-CM

## 2023-03-04 MED ORDER — FLUTICASONE PROPIONATE 50 MCG/ACT NA SUSP: 1.0000 | Freq: Two times a day (BID) | NASAL | 5 refills | Status: AC

## 2023-03-04 MED ORDER — LEVOCETIRIZINE DIHYDROCHLORIDE 5 MG PO TABS: 5.0000 mg | ORAL_TABLET | Freq: Every evening | ORAL | 1 refills | Status: DC

## 2023-03-04 MED ORDER — AZELASTINE HCL 0.1 % NA SOLN: 2.0000 | Freq: Two times a day (BID) | NASAL | 5 refills | Status: DC

## 2023-03-04 NOTE — Progress Notes (Signed)
FOLLOW UP  Date of Service/Encounter:  03/04/23   Assessment:   Perennial and seasonal allergic rhinitis (weeds, trees, indoor molds, outdoor molds, and dust mites)   Possible mold exposure   Multiple food intolerances - unclear diagnostic modality (but no history consistent with anaphylaxis)  Plan/Recommendations:   1. Chronic rhinitis - Testing today showed: weeds, trees, indoor molds, outdoor molds, and dust mites - Copy of test results provided.  - Avoidance measures provided. - Stop taking:  - Continue with: Flonase (fluticasone) one spray per nostril twice daily (AIM FOR EAR ON EACH SIDE) - Start taking: Xyzal (levocetirizine) 5mg  tablet once daily and Astelin (azelastine) 2 sprays per nostril 2 times daily. - Letter written for the school to see if they will pay for a dehumidifier.  - You can use an extra dose of the antihistamine, if needed, for breakthrough symptoms.  - Consider nasal saline rinses 1-2 times daily to remove allergens from the nasal cavities as well as help with mucous clearance (this is especially helpful to do before the nasal sprays are given) - Consider allergy shots as a means of long-term control. - Allergy shots "re-train" and "reset" the immune system to ignore environmental allergens and decrease the resulting immune response to those allergens (sneezing, itchy watery eyes, runny nose, nasal congestion, etc).    - Allergy shots improve symptoms in 75-85% of patients.  - We can discuss more at the next appointment if the medications are not working for you.  2. Food intolerances  - Continue to avoid all of your triggering foods as you are doing. - No EpiPen needed since these reactions are not anaphylactic in nature.   3. Return in about 3 months (around 06/02/2023).   Subjective:   Gloria Chen is a 62 y.o. female presenting today for follow up of No chief complaint on file.   Gloria Chen has a history of the following: Patient Active  Problem List   Diagnosis Date Noted   Adjustment disorder with anxiety 01/28/2023   Plantar fasciitis of right foot 12/21/2022   Palpitations 12/21/2022   Elevated blood pressure reading 12/21/2022   Frontal headache 12/21/2022   Right foot pain 10/26/2022   Type 2 diabetes mellitus without complication, without long-term current use of insulin (HCC) 04/16/2022   Keratosis pilaris 04/16/2022   Multiple thyroid nodules 02/16/2022   Right elbow pain 04/07/2021   Class 1 obesity due to excess calories without serious comorbidity with body mass index (BMI) of 30.0 to 30.9 in adult 02/16/2019   Bronchitis 02/23/2018   History of breast cancer in female 10/17/2014    History obtained from: chart review and patient.  Discussed the use of AI scribe software for clinical note transcription with the patient and/or guardian, who gave verbal consent to proceed.  Gloria Chen is a 62 y.o. female presenting for skin testing. She was last seen on November 14th. We could not do testing because her insurance company does not cover testing on the same day as a New Patient visit. She has been off of all antihistamines 3 days in anticipation of the testing. She came in with a history of chronic rhinitis and food allergies.   Otherwise, there have been no changes to her past medical history, surgical history, family history, or social history.    Review of systems otherwise negative other than that mentioned in the HPI.    Objective:   Last menstrual period 03/16/2000. There is no height or weight on file  to calculate BMI.    Physical exam deferred since this was a skin testing appointment only.   Diagnostic studies:    Allergy Studies:     Airborne Adult Perc - 03/04/23 1500     Time Antigen Placed 1504    Allergen Manufacturer Waynette Buttery    Location Back    Number of Test 55    Panel 1 Select    1. Control-Buffer 50% Glycerol Negative    2. Control-Histamine 3+    3. Bahia Negative    4.  [place] Negative    5. Johnson Negative    6. Kentucky Blue Negative    7. Meadow Fescue Negative    8. Perennial Rye Negative    9. Timothy Negative    10. Ragweed Mix Negative    11. Cocklebur Negative    12. Plantain,  English Negative    13. Baccharis Negative    14. Dog Fennel Negative    15. Russian Thistle Negative    16. Lamb's Quarters Negative    17. Sheep Sorrell Negative    18. Rough Pigweed Negative    19. Marsh Elder, Rough Negative    20. Mugwort, Common Negative    21. Box, Elder Negative    22. Cedar, red Negative    23. Sweet Gum Negative    24. Pecan Pollen Negative    25. Pine Mix Negative    26. Walnut, Black Pollen Negative    27. Red Mulberry Negative    28. Ash Mix Negative    29. Birch Mix Negative    30. Beech American Negative    31. Cottonwood, Guinea[place] Negative    32. Hickory, White Negative    33. Maple Mix Negative    34. Oak, [place] Mix Negative    35. Sycamore Eastern Negative    36. Alternaria Alternata Negative    37. Cladosporium Herbarum Negative    38. Aspergillus Mix Negative    39. Penicillium Mix Negative    40. Bipolaris Sorokiniana (Helminthosporium) Negative    41. Drechslera Spicifera (Curvularia) Negative    42. Mucor Plumbeus Negative    43. Fusarium Moniliforme Negative    44. Aureobasidium Pullulans (pullulara) Negative    45. Rhizopus Oryzae Negative    46. Botrytis Cinera Negative    47. Epicoccum Nigrum Negative    48. Phoma Betae Negative    49. Dust Mite Mix Negative    50. Cat Hair 10,000 BAU/ml Negative    51.  Dog Epithelia Negative    52. Mixed Feathers Negative    53. Horse Epithelia Negative    54. Cockroach, German Negative    55. Tobacco Leaf Negative             Intradermal - 03/04/23 1548     Time Antigen Placed 1600    Allergen Manufacturer Waynette Buttery    Location Arm    Number of Test 16    Intradermal Select    Control Negative    Bahia Negative    French [place] Territ[place] Negative    Johnson Negative     7 Grass Negative    Ragweed Mix Negative    Weed Mix 3+    Tree Mix 3+    Mold 1 Negative    Mold 2 Negative    Mold 3 3+    Mold 4 3+    Mite Mix 3+    Cat Negative    Dog Negative    Cockroach Negative  Food Adult Perc - 03/04/23 1500     Time Antigen Placed 1504    Allergen Manufacturer Waynette Buttery    Location Back    Number of allergen test 8     Control-buffer 50% Glycerol Negative    Control-Histamine 3+    1. Peanut Negative    2. Soybean Negative    5. Milk, Cow Negative    6. Casein Negative    7. Egg White, Chicken Negative    41. Pea, Green/English Negative    42. Navy Bean Negative    43. Green Beans Negative             Allergy testing results were read and interpreted by myself, documented by clinical staff.      Malachi Bonds, MD  [facility]

## 2023-03-04 NOTE — Patient Instructions (Addendum)
1. Chronic rhinitis - Testing today showed: weeds, trees, indoor molds, outdoor molds, and dust mites - Copy of test results provided.  - Avoidance measures provided. - Stop taking:  - Continue with: Flonase (fluticasone) one spray per nostril twice daily (AIM FOR EAR ON EACH SIDE) - Start taking: Xyzal (levocetirizine) 5mg  tablet once daily and Astelin (azelastine) 2 sprays per nostril 2 times daily. - Letter written for the school to see if they will pay for a dehumidifier.  - You can use an extra dose of the antihistamine, if needed, for breakthrough symptoms.  - Consider nasal saline rinses 1-2 times daily to remove allergens from the nasal cavities as well as help with mucous clearance (this is especially helpful to do before the nasal sprays are given) - Consider allergy shots as a means of long-term control. - Allergy shots "re-train" and "reset" the immune system to ignore environmental allergens and decrease the resulting immune response to those allergens (sneezing, itchy watery eyes, runny nose, nasal congestion, etc).    - Allergy shots improve symptoms in 75-85% of patients.  - We can discuss more at the next appointment if the medications are not working for you.  2. Food intolerances  - Continue to avoid all of your triggering foods as you are doing. - No EpiPen needed since these reactions are not anaphylactic in nature.   3. Return in about 3 months (around 06/02/2023).    Please inform us of any Emergency Department visits, hospitalizations, or changes in symptoms. Call us before going to the ED for breathing or allergy symptoms since we might be able to fit you in for a sick visit. Feel free to contact us anytime with any questions, problems, or concerns.  It was a pleasure to meet you today!  Websites that have reliable patient information: 1. American Academy of Asthma, Allergy, and Immunology: www.aaaai.org 2. Food Allergy Research and Education (FARE):  foodallergy.org 3. Mothers of Asthmatics: http://www.asthmacommunitynetwork.org 4. American College of Allergy, Asthma, and Immunology: www.acaai.org      "Like" [place] on Facebook and Instagram for our latest updates!      A healthy democracy works best when [employer] participate! Make sure you are registered to vote! If you have moved or changed any of your contact information, you will need to get this updated before voting! Scan the QR codes below to learn more!        Airborne Adult Perc - 03/04/23 1500     Time Antigen Placed 1504    Allergen Manufacturer Waynette Buttery    Location Back    Number of Test 55    Panel 1 Select    1. Control-Buffer 50% Glycerol Negative    2. Control-Histamine 3+    3. Bahia Negative    4. French Southern Territories Negative    5. Johnson Negative    6. Kentucky Blue Negative    7. Meadow Fescue Negative    8. Perennial Rye Negative    9. Timothy Negative    10. Ragweed Mix Negative    11. Cocklebur Negative    12. Plantain,  English Negative    13. Baccharis Negative    14. Dog Fennel Negative    15. Russian Thistle Negative    16. Lamb's Quarters Negative    17. Sheep Sorrell Negative    18. Rough Pigweed Negative    19. Marsh Elder, Rough Negative    20. Mugwort, Common Negative    21. Box, Elder Negative  22. Cedar, red Negative    23. Sweet Gum Negative    24. Pecan Pollen Negative    25. Pine Mix Negative    26. Walnut, Black Pollen Negative    27. Red Mulberry Negative    28. Ash Mix Negative    29. Birch Mix Negative    30. Beech American Negative    31. Cottonwood, Guinea-Bissau Negative    32. Hickory, White Negative    33. Maple Mix Negative    34. Oak, Guinea-[place] Mix Negative    35. Sycamore Eastern Negative    36. Alternaria Alternata Negative    37. Cladosporium Herbarum Negative    38. Aspergillus Mix Negative    39. Penicillium Mix Negative    40. Bipolaris Sorokiniana (Helminthosporium) Negative    41. Drechslera Spicifera (Curvularia)  Negative    42. Mucor Plumbeus Negative    43. Fusarium Moniliforme Negative    44. Aureobasidium Pullulans (pullulara) Negative    45. Rhizopus Oryzae Negative    46. Botrytis Cinera Negative    47. Epicoccum Nigrum Negative    48. Phoma Betae Negative    49. Dust Mite Mix Negative    50. Cat Hair 10,000 BAU/ml Negative    51.  Dog Epithelia Negative    52. Mixed Feathers Negative    53. Horse Epithelia Negative    54. Cockroach, German Negative    55. Tobacco Leaf Negative             Intradermal - 03/04/23 1548     Time Antigen Placed 1600    Allergen Manufacturer Greer    Location Arm    Number of Test 16    Intradermal Select    Control Negative    Bahia Negative    French Southern Territories Negative    Johnson Negative    7 Grass Negative    Ragweed Mix Negative    Weed Mix 3+    Tree Mix 3+    Mold 1 Negative    Mold 2 Negative    Mold 3 3+    Mold 4 3+    Mite Mix 3+    Cat Negative    Dog Negative    Cockroach Negative             Food Adult Perc - 03/04/23 1500     Time Antigen Placed 1504    Allergen Manufacturer Waynette Buttery    Location Back    Number of allergen test 8     Control-buffer 50% Glycerol Negative    Control-Histamine 3+    1. Peanut Negative    2. Soybean Negative    5. Milk, Cow Negative    6. Casein Negative    7. Egg White, Chicken Negative    41. Pea, Green/English Negative    42. Navy Bean Negative    43. Green Beans Negative             Reducing Pollen Exposure  The American Academy of Allergy, Asthma and Immunology suggests the following steps to reduce your exposure to pollen during allergy seasons.    Do not hang sheets or clothing out to dry; pollen may collect on these items. Do not mow lawns or spend time around freshly cut grass; mowing stirs up pollen. Keep windows closed at night.  Keep car windows closed while driving. Minimize morning activities outdoors, a time when pollen counts are usually at their highest. Stay  indoors as much as possible when pollen counts or  humidity is high and on windy days when pollen tends to remain in the air longer. Use air conditioning when possible.  Many air conditioners have filters that trap the pollen spores. Use a HEPA room air filter to remove pollen form the indoor air you breathe.  Control of Mold Allergen   Mold and fungi can grow on a variety of surfaces provided certain temperature and moisture conditions exist.  Outdoor molds grow on plants, decaying vegetation and soil.  The major outdoor mold, Alternaria and Cladosporium, are found in very high numbers during hot and dry conditions.  Generally, a late Summer - Fall peak is seen for common outdoor fungal spores.  Rain will temporarily lower outdoor mold spore count, but counts rise rapidly when the rainy period ends.  The most important indoor molds are Aspergillus and Penicillium.  Dark, humid and poorly ventilated basements are ideal sites for mold growth.  The next most common sites of mold growth are the bathroom and the kitchen.  Outdoor (Seasonal) Mold Control  Positive outdoor molds via skin testing: Bipolaris (Helminthsporium), Drechslera (Curvalaria), and Mucor  Use air conditioning and keep windows closed Avoid exposure to decaying vegetation. Avoid leaf raking. Avoid grain handling. Consider wearing a face mask if working in moldy areas.    Indoor (Perennial) Mold Control   Positive indoor molds via skin testing: Fusarium, Aureobasidium (Pullulara), and Rhizopus  Maintain humidity below 50%. Clean washable surfaces with 5% bleach solution. Remove sources e.g. contaminated carpets.    Control of Dust Mite Allergen    Dust mites play a major role in allergic asthma and rhinitis.  They occur in environments with high humidity wherever human skin is found.  Dust mites absorb humidity from the atmosphere (ie, they do not drink) and feed on organic matter (including shed human and animal skin).   Dust mites are a microscopic type of insect that you cannot see with the naked eye.  High levels of dust mites have been detected from mattresses, pillows, carpets, upholstered furniture, bed covers, clothes, soft toys and any woven material.  The principal allergen of the dust mite is found in its feces.  A gram of dust may contain 1,000 mites and 250,000 fecal particles.  Mite antigen is easily measured in the air during house cleaning activities.  Dust mites do not bite and do not cause harm to humans, other than by triggering allergies/asthma.    Ways to decrease your exposure to dust mites in your home:  Encase mattresses, box springs and pillows with a mite-impermeable barrier or cover   Wash sheets, blankets and drapes weekly in hot water (130 F) with detergent and dry them in a dryer on the hot setting.  Have the room cleaned frequently with a vacuum cleaner and a damp dust-mop.  For carpeting or rugs, vacuuming with a vacuum cleaner equipped with a high-efficiency particulate air (HEPA) filter.  The dust mite allergic individual should not be in a room which is being cleaned and should wait 1 hour after cleaning before going into the room. Do not sleep on upholstered furniture (eg, couches).   If possible removing carpeting, upholstered furniture and drapery from the home is ideal.  Horizontal blinds should be eliminated in the rooms where the person spends the most time (bedroom, study, television room).  Washable vinyl, roller-type shades are optimal. Remove all non-washable stuffed toys from the bedroom.  Wash stuffed toys weekly like sheets and blankets above.   Reduce indoor humidity to less  than 50%.  Inexpensive humidity monitors can be purchased at most hardware stores.  Do not use a humidifier as can make the problem worse and are not recommended.  Allergy Shots  Allergies are the result of a chain reaction that starts in the immune system. Your immune system controls how your body  defends itself. For instance, if you have an allergy to pollen, your immune system identifies pollen as an invader or allergen. Your immune system overreacts by producing antibodies called Immunoglobulin E (IgE). These antibodies travel to cells that release chemicals, causing an allergic reaction.  The concept behind allergy immunotherapy, whether it is received in the form of shots or tablets, is that the immune system can be desensitized to specific allergens that trigger allergy symptoms. Although it requires time and patience, the payback can be long-term relief. Allergy injections contain a dilute solution of those substances that you are allergic to based upon your skin testing and allergy history.   How Do Allergy Shots Work?  Allergy shots work much like a vaccine. Your body responds to injected amounts of a particular allergen given in increasing doses, eventually developing a resistance and tolerance to it. Allergy shots can lead to decreased, minimal or no allergy symptoms.  There generally are two phases: build-up and maintenance. Build-up often ranges from three to six months and involves receiving injections with increasing amounts of the allergens. The shots are typically given once or twice a week, though more rapid build-up schedules are sometimes used.  The maintenance phase begins when the most effective dose is reached. This dose is different for each person, depending on how allergic you are and your response to the build-up injections. Once the maintenance dose is reached, there are longer periods between injections, typically two to four weeks.  Occasionally doctors give cortisone-type shots that can temporarily reduce allergy symptoms. These types of shots are different and should not be confused with allergy immunotherapy shots.  Who Can Be Treated with Allergy Shots?  Allergy shots may be a good treatment approach for people with allergic rhinitis (hay fever), allergic  asthma, conjunctivitis (eye allergy) or stinging insect allergy.   Before deciding to begin allergy shots, you should consider:   The length of allergy season and the severity of your symptoms  Whether medications and/or changes to your environment can control your symptoms  Your desire to avoid long-term medication use  Time: allergy immunotherapy requires a major time commitment  Cost: may vary depending on your insurance coverage  Allergy shots for children age 55 and older are effective and often well tolerated. They might prevent the onset of new allergen sensitivities or the progression to asthma.  Allergy shots are not started on patients who are pregnant but can be continued on patients who become pregnant while receiving them. In some patients with other medical conditions or who take certain common medications, allergy shots may be of risk. It is important to mention other medications you talk to your allergist.   What are the two types of build-ups offered:   RUSH or Rapid Desensitization -- one day of injections lasting from 8:30-4:30pm, injections every 1 hour.  Approximately half of the build-up process is completed in that one day.  The following week, normal build-up is resumed, and this entails ~16 visits either weekly or twice weekly, until reaching your "maintenance dose" which is continued weekly until eventually getting spaced out to every month for a duration of 3 to 5 years. The regular build-up  appointments are nurse visits where the injections are administered, followed by required monitoring for 30 minutes.    Traditional build-up -- weekly visits for 6 -12 months until reaching "maintenance dose", then continue weekly until eventually spacing out to every 4 weeks as above. At these appointments, the injections are administered, followed by required monitoring for 30 minutes.     Either way is acceptable, and both are equally effective. With the rush protocol, the  advantage is that less time is spent here for injections overall AND you would also reach maintenance dosing faster (which is when the clinical benefit starts to become more apparent). Not everyone is a candidate for rapid desensitization.   IF we proceed with the RUSH protocol, there are premedications which must be taken the day before and the day after the rush only (this includes antihistamines, steroids, and Singulair).  After the rush day, no prednisone or Singulair is required, and we just recommend antihistamines taken on your injection day.  What Is An Estimate of the Costs?  If you are interested in starting allergy injections, please check with your insurance company about your coverage for both allergy vial sets and allergy injections.  Please do so prior to making the appointment to start injections.  The following are CPT codes to give to your insurance company. These are the amounts we BILL to the insurance company, but the amount YOU WILL PAY and WE RECEIVE IS SUBSTANTIALLY LESS and depends on the contracts we have with different insurance companies.   Amount Billed to Insurance One allergy vial set  CPT 95165   $ 1200     Two allergy vial set  CPT 95165   $ 2400     Three allergy vial set  CPT 95165   $ 3600     One injection   CPT 95115   $ 35  Two injections   CPT 95117   $ 40 RUSH (Rapid Desensitization) CPT 95180 x 8 hours $500/hour  Regarding the allergy injections, your co-pay may or may not apply with each injection, so please confirm this with your insurance company. When you start allergy injections, 1 or 2 sets of vials are made based on your allergies.  Not all patients can be on one set of vials. A set of vials lasts 6 months to a year depending on how quickly you can proceed with your build-up of your allergy injections. Vials are personalized for each patient depending on their specific allergens.  How often are allergy injection given during the build-up period?    Injections are given at least weekly during the build-up period until your maintenance dose is achieved. Per the doctor's discretion, you may have the option of getting allergy injections two times per week during the build-up period. However, there must be at least 48 hours between injections. The build-up period is usually completed within 6-12 months depending on your ability to schedule injections and for adjustments for reactions. When maintenance dose is reached, your injection schedule is gradually changed to every two weeks and later to every three weeks. Injections will then continue every 4 weeks. Usually, injections are continued for a total of 3-5 years.   When Will I Feel Better?  Some may experience decreased allergy symptoms during the build-up phase. For others, it may take as long as 12 months on the maintenance dose. If there is no improvement after a year of maintenance, your allergist will discuss other treatment options with you.  If  you aren't responding to allergy shots, it may be because there is not enough dose of the allergen in your vaccine or there are missing allergens that were not identified during your allergy testing. Other reasons could be that there are high levels of the allergen in your environment or major exposure to non-allergic triggers like tobacco smoke.  What Is the Length of Treatment?  Once the maintenance dose is reached, allergy shots are generally continued for three to five years. The decision to stop should be discussed with your allergist at that time. Some people may experience a permanent reduction of allergy symptoms. Others may relapse and a longer course of allergy shots can be considered.  What Are the Possible Reactions?  The two types of adverse reactions that can occur with allergy shots are local and systemic. Common local reactions include very mild redness and swelling at the injection site, which can happen immediately or several  hours after. Report a delayed reaction from your last injection. These include arm swelling or runny nose, watery eyes or cough that occurs within 12-24 hours after injection. A systemic reaction, which is less common, affects the entire body or a particular body system. They are usually mild and typically respond quickly to medications. Signs include increased allergy symptoms such as sneezing, a stuffy nose or hives.   Rarely, a serious systemic reaction called anaphylaxis can develop. Symptoms include swelling in the throat, wheezing, a feeling of tightness in the chest, nausea or dizziness. Most serious systemic reactions develop within 30 minutes of allergy shots. This is why it is strongly recommended you wait in your doctor's office for 30 minutes after your injections. Your allergist is trained to watch for reactions, and his or her staff is trained and equipped with the proper medications to identify and treat them.   Report to the nurse immediately if you experience any of the following symptoms: swelling, itching or redness of the skin, hives, watery eyes/nose, breathing difficulty, excessive sneezing, coughing, stomach pain, diarrhea, or light headedness. These symptoms may occur within 15-20 minutes after injection and may require medication.   Who Should Administer Allergy Shots?  The preferred location for receiving shots is your prescribing allergist's office. Injections can sometimes be given at another facility where the physician and staff are trained to recognize and treat reactions, and have received instructions by your prescribing allergist.  What if I am late for an injection?   Injection dose will be adjusted depending upon how many days or weeks you are late for your injection.   What if I am sick?   Please report any illness to the nurse before receiving injections. She may adjust your dose or postpone injections depending on your symptoms. If you have fever, flu, sinus  infection or chest congestion it is best to postpone allergy injections until you are better. Never get an allergy injection if your asthma is causing you problems. If your symptoms persist, seek out medical care to get your health problem under control.  What If I am or Become Pregnant:  Women that become pregnant should schedule an appointment with [facility] before receiving any further allergy injections.

## 2023-03-06 ENCOUNTER — Encounter: Payer: Self-pay | Admitting: Allergy & Immunology

## 2023-04-02 ENCOUNTER — Encounter: Payer: Self-pay | Admitting: Internal Medicine

## 2023-04-02 ENCOUNTER — Other Ambulatory Visit: Payer: Self-pay | Admitting: Internal Medicine

## 2023-04-02 DIAGNOSIS — E042 Nontoxic multinodular goiter: Secondary | ICD-10-CM

## 2023-04-28 ENCOUNTER — Ambulatory Visit
Admission: RE | Admit: 2023-04-28 | Discharge: 2023-04-28 | Disposition: A | Discharge status: Home / Self Care | Payer: 59 | Source: Ambulatory Visit | Attending: Internal Medicine | Admitting: Internal Medicine

## 2023-04-28 DIAGNOSIS — E042 Nontoxic multinodular goiter: Secondary | ICD-10-CM

## 2023-04-28 LAB — HM MAMMOGRAPHY

## 2023-05-03 LAB — HM PAP SMEAR
HPV 16/18/45 genotyping: NEGATIVE
HPV, high-risk: POSITIVE

## 2023-05-06 ENCOUNTER — Encounter: Payer: Self-pay | Admitting: Internal Medicine

## 2023-05-10 ENCOUNTER — Encounter: Payer: Self-pay | Admitting: Internal Medicine

## 2023-05-10 ENCOUNTER — Ambulatory Visit (INDEPENDENT_AMBULATORY_CARE_PROVIDER_SITE_OTHER): Payer: 59 | Admitting: Internal Medicine

## 2023-05-10 VITALS — BP 110/80 | HR 82 | Temp 98.4°F | Ht 63.0 in | Wt 176.6 lb

## 2023-05-10 DIAGNOSIS — Z0000 Encounter for general adult medical examination without abnormal findings: Secondary | ICD-10-CM | POA: Insufficient documentation

## 2023-05-10 DIAGNOSIS — E6609 Other obesity due to excess calories: Secondary | ICD-10-CM

## 2023-05-10 DIAGNOSIS — E669 Obesity, unspecified: Secondary | ICD-10-CM | POA: Insufficient documentation

## 2023-05-10 DIAGNOSIS — Z6831 Body mass index (BMI) 31.0-31.9, adult: Secondary | ICD-10-CM

## 2023-05-10 DIAGNOSIS — F4322 Adjustment disorder with anxiety: Secondary | ICD-10-CM | POA: Diagnosis not present

## 2023-05-10 DIAGNOSIS — R002 Palpitations: Secondary | ICD-10-CM

## 2023-05-10 DIAGNOSIS — E1169 Type 2 diabetes mellitus with other specified complication: Secondary | ICD-10-CM

## 2023-05-10 DIAGNOSIS — E66811 Obesity, class 1: Secondary | ICD-10-CM

## 2023-05-10 LAB — POCT URINALYSIS DIPSTICK
Bilirubin, UA: NEGATIVE
Blood, UA: NEGATIVE
Glucose, UA: NEGATIVE
Ketones, UA: NEGATIVE
Leukocytes, UA: NEGATIVE
Nitrite, UA: NEGATIVE
Protein, UA: NEGATIVE
Spec Grav, UA: >=1.03 — AB (ref 1.010–1.025)
Urobilinogen, UA: 0.2 U/dL
pH, UA: 5.5 (ref 5.0–8.0)

## 2023-05-10 NOTE — Progress Notes (Signed)
 I,Victoria T Deloria Lair, CMA,acting as a Neurosurgeon for Gwynneth Aliment, MD.,have documented all relevant documentation on the behalf of Gwynneth Aliment, MD,as directed by  Gwynneth Aliment, MD while in the presence of Gwynneth Aliment, MD.  Subjective:    Patient ID: Gloria Chen , female    DOB: February 12, 1961 , 63 y.o.   MRN: [id number]  Chief Complaint  Patient presents with   Annual Exam   Diabetes    HPI  She is here today for a full physical exam. She has her pelvic exams performed by her GYN, Dr.Angela Roberts.  Last pap was February 12th, 2025.  She denies having any headaches, chest pain and shortness of breath.   Letter sent to GYN for pap & mammogram.    Diabetes She presents for her follow-up diabetic visit. She has type 2 diabetes mellitus. Her disease course has been stable. There are no hypoglycemic associated symptoms. There are no diabetic associated symptoms. There are no hypoglycemic complications. Risk factors for coronary artery disease include post-menopausal. Current diabetic treatment includes diet.     Past Medical History:  Diagnosis Date   Allergy    Cancer (HCC)    Constipation    Edema, lower extremity    Food allergy, peanut    Lactose intolerance .   Personal history of chemotherapy    Pre-diabetes    Swelling of right ankle joint    Vitamin D deficiency      Family History  Problem Relation Age of Onset   Hypertension Mother    Thyroid disease Mother    Heart disease Father    Hyperlipidemia Father    Hypertension Father    Stroke Father    Diabetes Father    Hypertension Sister    Diabetes Sister    Asthma Brother    Hypertension Brother    Breast cancer Maternal Aunt      Current Outpatient Medications:    azelastine (ASTELIN) 0.1 % nasal spray, Place 2 sprays into both nostrils 2 (two) times daily. Use in each nostril as directed, Disp: 30 mL, Rfl: 5   blood glucose meter kit and supplies KIT, Dispense based on patient and insurance  preference. Use up to four times daily as directed. (Patient not taking: Reported on 05/10/2023), Disp: 1 each, Rfl: 0   diclofenac (VOLTAREN) 75 MG EC tablet, Take 1 tablet (75 mg total) by mouth 2 (two) times daily as needed. (Patient not taking: Reported on 05/10/2023), Disp: 60 tablet, Rfl: 2   fluticasone (FLONASE) 50 MCG/ACT nasal spray, Place 1 spray into both nostrils in the morning and at bedtime. (Patient not taking: Reported on 05/10/2023), Disp: 16 g, Rfl: 5   levocetirizine (XYZAL) 5 MG tablet, Take 1 tablet (5 mg total) by mouth every evening. (Patient not taking: Reported on 05/10/2023), Disp: 90 tablet, Rfl: 1   Allergies  Allergen Reactions   Almond Oil    Peanut-Containing Drug Products    Soy Allergy (Obsolete)    Wheat       The patient states she uses post menopausal status for birth control. Patient's last menstrual period was 03/16/2000.. Negative for Dysmenorrhea. Negative for: breast discharge, breast lump(s), breast pain and breast self exam. Associated symptoms include abnormal vaginal bleeding. Pertinent negatives include abnormal bleeding (hematology), anxiety, decreased libido, depression, difficulty falling sleep, dyspareunia, history of infertility, nocturia, sexual dysfunction, sleep disturbances, urinary incontinence, urinary urgency, vaginal discharge and vaginal itching. Diet regular.The patient states her exercise level is  moderate.  . The patient's tobacco use is:  Social History   Tobacco Use  Smoking Status Never   Passive exposure: Never  Smokeless Tobacco Never  . She has been exposed to passive smoke. The patient's alcohol use is:  Social History   Substance and Sexual Activity  Alcohol Use No    Review of Systems  Constitutional: Negative.   HENT: Negative.    Eyes: Negative.   Respiratory: Negative.    Cardiovascular: Negative.   Gastrointestinal: Negative.   Endocrine: Negative.   Genitourinary: Negative.   Musculoskeletal: Negative.    Skin: Negative.   Allergic/Immunologic: Negative.   Neurological: Negative.   Hematological: Negative.   Psychiatric/Behavioral: Negative.       Today's Vitals   05/10/23 0959  BP: 110/80  Pulse: 82  Temp: 98.4 F (36.9 C)  SpO2: 98%  Weight: 176 lb 9.6 oz (80.1 kg)  Height: 5\' 3"  (1.6 m)   Body mass index is 31.28 kg/m.  Wt Readings from Last 3 Encounters:  05/10/23 176 lb 9.6 oz (80.1 kg)  02/18/23 173 lb 12.8 oz (78.8 kg)  01/28/23 169 lb (76.7 kg)     Objective:  Physical Exam Vitals and nursing note reviewed.  Constitutional:      Appearance: Normal appearance.  HENT:     Head: Normocephalic and atraumatic.     Right Ear: Tympanic membrane, ear canal and external ear normal.     Left Ear: Tympanic membrane, ear canal and external ear normal.     Nose:     Comments: Masked     Mouth/Throat:     Comments: Masked  Eyes:     Extraocular Movements: Extraocular movements intact.     Conjunctiva/sclera: Conjunctivae normal.     Pupils: Pupils are equal, round, and reactive to light.  Neck:     Thyroid: Thyromegaly present.     Comments: Multinodular goiter Cardiovascular:     Rate and Rhythm: Normal rate and regular rhythm.     Pulses:          Dorsalis pedis pulses are 3+ on the right side and 3+ on the left side.     Heart sounds: Normal heart sounds.  Pulmonary:     Effort: Pulmonary effort is normal.     Breath sounds: Normal breath sounds.  Chest:  Breasts:    Tanner Score is 5.     Right: Normal.     Comments: Left mastectomy Abdominal:     General: Bowel sounds are normal.     Palpations: Abdomen is soft.     Comments: Rounded, soft  Genitourinary:    Comments: deferred Musculoskeletal:        General: Normal range of motion.     Cervical back: Normal range of motion and neck supple.  Feet:     Right foot:     Protective Sensation: 5 sites tested.  5 sites sensed.     Skin integrity: Skin integrity normal.     Toenail Condition: Right  toenails are normal.     Left foot:     Protective Sensation: 5 sites tested.  5 sites sensed.     Skin integrity: Skin integrity normal.     Toenail Condition: Left toenails are normal.  Skin:    General: Skin is warm and dry.  Neurological:     General: No focal deficit present.     Mental Status: She is alert and oriented to person, place, and time.  Psychiatric:  Mood and Affect: Mood normal.        Behavior: Behavior normal.         Assessment And Plan:     Encounter for annual health examination Assessment & Plan: A full exam was performed.  Importance of monthly self breast exams was discussed with the patient.  She is advised to get 30-45 minutes of regular exercise, no less than four to five days per week. Both weight-bearing and aerobic exercises are recommended.  She is advised to follow a healthy diet with at least six fruits/veggies per day, decrease intake of red meat and other saturated fats and to increase fish intake to twice weekly.  Meats/fish should not be fried -- baked, boiled or broiled is preferable. It is also important to cut back on your sugar intake.  Be sure to read labels - try to avoid anything with added sugar, high fructose corn syrup or other sweeteners.  If you must use a sweetener, you can try stevia or monkfruit.  It is also important to avoid artificially sweetened foods/beverages and diet drinks. Lastly, wear SPF 50 sunscreen on exposed skin and when in direct sunlight for an extended period of time.  Be sure to avoid fast food restaurants and aim for at least 60 ounces of water daily.      Orders: -     CBC -     CMP14+EGFR -     Lipid panel -     Hemoglobin A1c  Type 2 diabetes mellitus with obesity (HCC) Assessment & Plan: Chronic, she is not on meds per her preference. She will need treatment if her A1c goes above 7%.  Diabetic foot exam was performed.  She will f/u in four months for re-evaluation. I DISCUSSED WITH THE PATIENT AT  LENGTH REGARDING THE GOALS OF GLYCEMIC CONTROL AND POSSIBLE LONG-TERM COMPLICATIONS.  I  ALSO STRESSED THE IMPORTANCE OF COMPLIANCE WITH HOME GLUCOSE MONITORING, DIETARY RESTRICTIONS INCLUDING AVOIDANCE OF SUGARY DRINKS/PROCESSED FOODS,  ALONG WITH REGULAR EXERCISE.  I  ALSO STRESSED THE IMPORTANCE OF ANNUAL EYE EXAMS, SELF FOOT CARE AND COMPLIANCE WITH OFFICE VISITS.   Orders: -     EKG 12-Lead -     POCT urinalysis dipstick -     Microalbumin / creatinine urine ratio  She is encouraged to strive for BMI less than 30 to decrease cardiac risk. Advised to aim for at least 150 minutes of exercise per week.    Return for 1 YEAR HM, 4 MONTH DM . Patient was given opportunity to ask questions. Patient verbalized understanding of the plan and was able to repeat key elements of the plan. All questions were answered to their satisfaction.   I, Gwynneth Aliment, MD, have reviewed all documentation for this visit. The documentation on 05/10/23 for the exam, diagnosis, procedures, and orders are all accurate and complete.

## 2023-05-10 NOTE — Patient Instructions (Signed)

## 2023-05-10 NOTE — Assessment & Plan Note (Signed)

## 2023-05-11 LAB — LIPID PANEL
Chol/HDL Ratio: 2.6 ratio (ref 0.0–4.4)
Cholesterol, Total: 201 mg/dL — ABNORMAL HIGH (ref 100–199)
HDL: 77 mg/dL (ref >39)
LDL Chol Calc (NIH): 108 mg/dL — ABNORMAL HIGH (ref 0–99)
Triglycerides: 92 mg/dL (ref 0–149)
VLDL Cholesterol Cal: 16 mg/dL (ref 5–40)

## 2023-05-11 LAB — CMP14+EGFR
ALT: 23 IU/L (ref 0–32)
AST: 15 IU/L (ref 0–40)
Albumin: 4.5 g/dL (ref 3.9–4.9)
Alkaline Phosphatase: 91 IU/L (ref 44–121)
BUN/Creatinine Ratio: 18 (ref 12–28)
BUN: 11 mg/dL (ref 8–27)
Bilirubin Total: 0.4 mg/dL (ref 0.0–1.2)
CO2: 21 mmol/L (ref 20–29)
Calcium: 9.8 mg/dL (ref 8.7–10.3)
Chloride: 106 mmol/L (ref 96–106)
Creatinine, Ser: 0.61 mg/dL (ref 0.57–1.00)
Globulin, Total: 3 g/dL (ref 1.5–4.5)
Glucose: 122 mg/dL — ABNORMAL HIGH (ref 70–99)
Potassium: 4.3 mmol/L (ref 3.5–5.2)
Sodium: 141 mmol/L (ref 134–144)
Total Protein: 7.5 g/dL (ref 6.0–8.5)
eGFR: 102 mL/min/{1.73_m2} (ref >59)

## 2023-05-11 LAB — CBC
Hematocrit: 41 % (ref 34.0–46.6)
Hemoglobin: 13.4 g/dL (ref 11.1–15.9)
MCH: 30 pg (ref 26.6–33.0)
MCHC: 32.7 g/dL (ref 31.5–35.7)
MCV: 92 fL (ref 79–97)
Platelets: 216 10*3/uL (ref 150–450)
RBC: 4.47 x10E6/uL (ref 3.77–5.28)
RDW: 12.4 % (ref 11.7–15.4)
WBC: 4.4 10*3/uL (ref 3.4–10.8)

## 2023-05-11 LAB — MICROALBUMIN / CREATININE URINE RATIO
Creatinine, Urine: 153.2 mg/dL
Microalb/Creat Ratio: 23 mg/g{creat} (ref 0–29)
Microalbumin, Urine: 34.6 ug/mL

## 2023-05-11 LAB — HEMOGLOBIN A1C
Est. average glucose Bld gHb Est-mCnc: 128 mg/dL
Hgb A1c MFr Bld: 6.1 % — ABNORMAL HIGH (ref 4.8–5.6)

## 2023-05-15 NOTE — Assessment & Plan Note (Signed)
 Chronic, she is not on meds per her preference. She will need treatment if her A1c goes above 7%.  Diabetic foot exam was performed.  She will f/u in four months for re-evaluation. I DISCUSSED WITH THE PATIENT AT LENGTH REGARDING THE GOALS OF GLYCEMIC CONTROL AND POSSIBLE LONG-TERM COMPLICATIONS.  I  ALSO STRESSED THE IMPORTANCE OF COMPLIANCE WITH HOME GLUCOSE MONITORING, DIETARY RESTRICTIONS INCLUDING AVOIDANCE OF SUGARY DRINKS/PROCESSED FOODS,  ALONG WITH REGULAR EXERCISE.  I  ALSO STRESSED THE IMPORTANCE OF ANNUAL EYE EXAMS, SELF FOOT CARE AND COMPLIANCE WITH OFFICE VISITS.

## 2023-06-03 ENCOUNTER — Ambulatory Visit: Payer: BC Managed Care – PPO | Admitting: Allergy & Immunology

## 2023-06-08 ENCOUNTER — Encounter: Payer: Self-pay | Admitting: Nurse Practitioner

## 2023-06-08 ENCOUNTER — Ambulatory Visit: Payer: Self-pay | Admitting: Nurse Practitioner

## 2023-06-08 VITALS — BP 120/60 | HR 88 | Temp 99.0°F | Ht 63.0 in | Wt 178.0 lb

## 2023-06-08 DIAGNOSIS — E6609 Other obesity due to excess calories: Secondary | ICD-10-CM | POA: Diagnosis not present

## 2023-06-08 DIAGNOSIS — E66811 Obesity, class 1: Secondary | ICD-10-CM

## 2023-06-08 DIAGNOSIS — J209 Acute bronchitis, unspecified: Secondary | ICD-10-CM

## 2023-06-08 DIAGNOSIS — Z6831 Body mass index (BMI) 31.0-31.9, adult: Secondary | ICD-10-CM

## 2023-06-08 MED ORDER — HYDROCODONE BIT-HOMATROP MBR 5-1.5 MG/5ML PO SOLN: 5.0000 mL | Freq: Four times a day (QID) | ORAL | 0 refills | Status: DC | PRN

## 2023-06-08 MED ORDER — PREDNISONE 10 MG (21) PO TBPK: ORAL_TABLET | ORAL | 0 refills | Status: DC

## 2023-06-08 MED ORDER — TRIAMCINOLONE ACETONIDE 40 MG/ML IJ SUSP
40.0000 mg | Freq: Once | INTRAMUSCULAR | Status: AC
  Administered 2023-06-08: 40 mg via INTRAMUSCULAR

## 2023-06-08 MED ORDER — AZITHROMYCIN 250 MG PO TABS: ORAL_TABLET | ORAL | 0 refills | Status: AC

## 2023-06-08 NOTE — Progress Notes (Signed)
 I,Jameka J Llittleton, CMA,acting as a Neurosurgeon for [employer], FNP.,have documented all relevant documentation on the behalf of Arnette Felts, FNP,as directed by  Arnette Felts, FNP while in the presence of Arnette Felts, FNP.  Subjective:  Patient ID: Gloria Chen , female    DOB: 11-03-1960 , 63 y.o.   MRN: [id number]  Chief Complaint  Patient presents with   Cough    HPI  Patient presents today for cold symptoms. Patient reports her cough has gotten worse over the past 7 days. Patient has been using otc and is still not feeling any better. She has had green and yellow mucous. 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   Cough This is a new problem. The current episode started in the past 7 days. The problem has been gradually worsening. The problem occurs constantly. The cough is Productive of sputum. Associated symptoms include chest pain, rhinorrhea and wheezing. Pertinent negatives include no chills, ear pain or fever. Nothing aggravates the symptoms. She has tried OTC cough suppressant for the symptoms. The treatment provided no relief. Her past medical history is significant for bronchitis (has had bronchitis in the past). There is no history of pneumonia.     Past Medical History:  Diagnosis Date   Allergy    Cancer (HCC)    Constipation    Edema, lower extremity    Food allergy, peanut    Lactose intolerance .   Personal history of chemotherapy    Pre-diabetes    Swelling of right ankle joint    Vitamin D deficiency      Family History  Problem Relation Age of Onset   Hypertension Mother    Thyroid disease Mother    Heart disease Father    Hyperlipidemia Father    Hypertension Father    Stroke Father    Diabetes Father    Hypertension Sister    Diabetes Sister    Asthma Brother    Hypertension Brother    Breast cancer Maternal Aunt      Current Outpatient Medications:    HYDROcodone bit-homatropine (HYDROMET)  5-1.5 MG/5ML syrup, Take 5 mLs by mouth every 6 (six) hours as needed., Disp: 120 mL, Rfl: 0   predniSONE (STERAPRED UNI-PAK 21 TAB) 10 MG (21) TBPK tablet, Take as directed, Disp: 21 tablet, Rfl: 0   azelastine (ASTELIN) 0.1 % nasal spray, Place 2 sprays into both nostrils 2 (two) times daily. Use in each nostril as directed (Patient not taking: Reported on 06/08/2023), Disp: 30 mL, Rfl: 5   blood glucose meter kit and supplies KIT, Dispense based on patient and insurance preference. Use up to four times daily as directed. (Patient not taking: Reported on 11/12/2022), Disp: 1 each, Rfl: 0   diclofenac (VOLTAREN) 75 MG EC tablet, Take 1 tablet (75 mg total) by mouth 2 (two) times daily as needed. (Patient not taking: Reported on 06/08/2023), Disp: 60 tablet, Rfl: 2   fluticasone (FLONASE) 50 MCG/ACT nasal spray, Place 1 spray into both nostrils in the morning and at bedtime. (Patient not taking: Reported on 06/08/2023), Disp: 16 g, Rfl: 5   levocetirizine (XYZAL) 5 MG tablet, Take 1 tablet (5 mg total) by mouth every evening. (Patient not taking: Reported on 05/10/2023), Disp: 90 tablet, Rfl: 1   Allergies  Allergen Reactions   Almond Oil    Peanut-Containing Drug Products    Soy Allergy (Obsolete)    Wheat  Review of Systems  Constitutional:  Negative for chills and fever.  HENT:  Positive for rhinorrhea. Negative for ear pain.   Respiratory:  Positive for cough and wheezing.   Cardiovascular:  Positive for chest pain.  Neurological: Negative.   Psychiatric/Behavioral: Negative.       Today's Vitals   06/08/23 1638  BP: 120/60  Pulse: 88  Temp: 99 F (37.2 C)  TempSrc: Oral  Weight: 178 lb (80.7 kg)  Height: 5\' 3"  (1.6 m)  PainSc: 0-No pain   Body mass index is 31.53 kg/m.  Wt Readings from Last 3 Encounters:  06/08/23 178 lb (80.7 kg)  05/10/23 176 lb 9.6 oz (80.1 kg)  02/18/23 173 lb 12.8 oz (78.8 kg)    Objective:  Physical Exam Vitals and nursing note reviewed.   Constitutional:      General: She is not in acute distress.    Appearance: Normal appearance. She is obese.  Cardiovascular:     Rate and Rhythm: Normal rate and regular rhythm.     Pulses: Normal pulses.     Heart sounds: Normal heart sounds. No murmur heard. Pulmonary:     Effort: Pulmonary effort is normal. No respiratory distress.     Breath sounds: Normal breath sounds. No wheezing.     Comments: Productive cough noted Chest:     Chest wall: No tenderness.  Skin:    General: Skin is warm and dry.     Capillary Refill: Capillary refill takes less than 2 seconds.  Neurological:     General: No focal deficit present.     Mental Status: She is alert and oriented to person, place, and time.     Cranial Nerves: No cranial nerve deficit.     Motor: No weakness.         Assessment And Plan:  Acute bronchitis, unspecified organism Assessment & Plan: Will treat with steroid injection and oral for after. Will also treat with azithromycin due to increased risk for infection.   Orders: -     Triamcinolone Acetonide -     HYDROcodone Bit-Homatrop MBr; Take 5 mLs by mouth every 6 (six) hours as needed.  Dispense: 120 mL; Refill: 0 -     predniSONE; Take as directed  Dispense: 21 tablet; Refill: 0 -     Azithromycin; Take 2 tablets (500 mg) on  Day 1,  followed by 1 tablet (250 mg) once daily on Days 2 through 5.  Dispense: 6 each; Refill: 0  Class 1 obesity due to excess calories without serious comorbidity with body mass index (BMI) of 31.0 to 31.9 in adult Assessment & Plan: She is encouraged to strive for BMI less than 30 to decrease cardiac risk. Advised to aim for at least 150 minutes of exercise per week.      Return if symptoms worsen or fail to improve.  Patient was given opportunity to ask questions. Patient verbalized understanding of the plan and was able to repeat key elements of the plan. All questions were answered to their satisfaction.    Jeanell Sparrow, FNP,  have reviewed all documentation for this visit. The documentation on 06/08/23 for the exam, diagnosis, procedures, and orders are all accurate and complete.   IF YOU HAVE BEEN REFERRED TO A SPECIALIST, IT MAY TAKE 1-2 WEEKS TO SCHEDULE/PROCESS THE REFERRAL. IF YOU HAVE NOT HEARD FROM US/SPECIALIST IN TWO WEEKS, PLEASE GIVE [place] A CALL AT [phone].

## 2023-06-20 NOTE — Assessment & Plan Note (Signed)
 She is encouraged to strive for BMI less than 30 to decrease cardiac risk. Advised to aim for at least 150 minutes of exercise per week.

## 2023-06-20 NOTE — Assessment & Plan Note (Signed)
 Will treat with steroid injection and oral for after. Will also treat with azithromycin due to increased risk for infection.

## 2023-09-07 ENCOUNTER — Ambulatory Visit: Payer: 59 | Admitting: Internal Medicine

## 2023-09-07 ENCOUNTER — Encounter: Payer: Self-pay | Admitting: Internal Medicine

## 2023-09-07 VITALS — BP 110/80 | HR 73 | Temp 98.7°F | Ht 63.0 in | Wt 175.0 lb

## 2023-09-07 DIAGNOSIS — E119 Type 2 diabetes mellitus without complications: Secondary | ICD-10-CM

## 2023-09-07 DIAGNOSIS — Z8249 Family history of ischemic heart disease and other diseases of the circulatory system: Secondary | ICD-10-CM

## 2023-09-07 DIAGNOSIS — Z2821 Immunization not carried out because of patient refusal: Secondary | ICD-10-CM

## 2023-09-07 DIAGNOSIS — E66811 Obesity, class 1: Secondary | ICD-10-CM | POA: Diagnosis not present

## 2023-09-07 DIAGNOSIS — E1169 Type 2 diabetes mellitus with other specified complication: Secondary | ICD-10-CM | POA: Diagnosis not present

## 2023-09-07 DIAGNOSIS — E6609 Other obesity due to excess calories: Secondary | ICD-10-CM

## 2023-09-07 DIAGNOSIS — Z6831 Body mass index (BMI) 31.0-31.9, adult: Secondary | ICD-10-CM

## 2023-09-07 NOTE — Progress Notes (Signed)
 I,Jameka J Llittleton, CMA,acting as a Neurosurgeon for [name], MD.,have documented all relevant documentation on the behalf of [name], MD,as directed by  [name], MD while in the presence of [name], MD.  Subjective:  Patient ID: Gloria Chen , female    DOB: 11-May-1960 , 63 y.o.   MRN: [id number]  Chief Complaint  Patient presents with   Diabetes    Patient presents today for diabetes check. Patient reports she is not taking any meds at this time. She reports she would like to be referred to a nutritionist.     HPI Discussed the use of AI scribe software for clinical note transcription with the patient, who gave verbal consent to proceed.  History of Present Illness Gloria Chen is a 63 year old female with diabetes who presents for a diabetes check.  She is managing her diabetes through diet and exercise. She previously attempted to attend diabetes classes but faced scheduling conflicts and lost contact with the nutritionist. She is interested in another referral to a nutritionist. She is cautious about taking medications due to concerns about potential side effects.  Her last LDL cholesterol level was 891 mg/dL. She has a family history of heart disease on both sides of her family. A calcium scoring test last year showed a score of zero.  She experiences swelling in both ankles, which have always been puffy. She drinks about two liters of water daily and is trying to increase her intake. No shortness of breath.  In terms of physical activity, she is not exercising much currently, mostly walking occasionally. She plans to increase her physical activity. She enjoys gardening in her front yard, planting various vegetables and herbs, and tends to do this in the morning to avoid the heat.      Diabetes She presents for her follow-up diabetic visit. She has type 2 diabetes mellitus. Her disease course has been stable. Hypoglycemia symptoms include  nervousness/anxiousness. There are no diabetic associated symptoms. Risk factors for coronary artery disease include post-menopausal. Current diabetic treatment includes diet.     Past Medical History:  Diagnosis Date   Allergy     Cancer (HCC)    Constipation    Edema, lower extremity    Food allergy , peanut    Lactose intolerance .   Personal history of chemotherapy    Pre-diabetes    Swelling of right ankle joint    Vitamin D  deficiency      Family History  Problem Relation Age of Onset   Hypertension Mother    Thyroid  disease Mother    Heart disease Father    Hyperlipidemia Father    Hypertension Father    Stroke Father    Diabetes Father    Hypertension Sister    Diabetes Sister    Asthma Brother    Hypertension Brother    Breast cancer Maternal Aunt      Current Outpatient Medications:    azelastine  (ASTELIN ) 0.1 % nasal spray, Place 2 sprays into both nostrils 2 (two) times daily. Use in each nostril as directed (Patient not taking: Reported on 09/07/2023), Disp: 30 mL, Rfl: 5   blood glucose meter kit and supplies KIT, Dispense based on patient and insurance preference. Use up to four times daily as directed. (Patient not taking: Reported on 09/07/2023), Disp: 1 each, Rfl: 0   diclofenac  (VOLTAREN ) 75 MG EC tablet, Take 1 tablet (75 mg total) by mouth 2 (two) times daily as needed. (  Patient not taking: Reported on 09/07/2023), Disp: 60 tablet, Rfl: 2   fluticasone  (FLONASE ) 50 MCG/ACT nasal spray, Place 1 spray into both nostrils in the morning and at bedtime. (Patient not taking: Reported on 09/07/2023), Disp: 16 g, Rfl: 5   HYDROcodone  bit-homatropine (HYDROMET) 5-1.5 MG/5ML syrup, Take 5 mLs by mouth every 6 (six) hours as needed. (Patient not taking: Reported on 09/07/2023), Disp: 120 mL, Rfl: 0   levocetirizine (XYZAL ) 5 MG tablet, Take 1 tablet (5 mg total) by mouth every evening. (Patient not taking: Reported on 09/07/2023), Disp: 90 tablet, Rfl: 1   predniSONE   (STERAPRED UNI-PAK 21 TAB) 10 MG (21) TBPK tablet, Take as directed (Patient not taking: Reported on 09/07/2023), Disp: 21 tablet, Rfl: 0   Allergies  Allergen Reactions   Almond Oil    Peanut-Containing Drug Products    Soy Allergy  (Obsolete)    Wheat      Review of Systems  Constitutional: Negative.   Respiratory: Negative.    Cardiovascular: Negative.   Gastrointestinal: Negative.   Neurological: Negative.   Psychiatric/Behavioral:  The patient is nervous/anxious.      Today's Vitals   09/07/23 1159  BP: 110/80  Pulse: 73  Temp: 98.7 F (37.1 C)  TempSrc: Oral  Weight: 175 lb (79.4 kg)  Height: 5' 3 (1.6 m)  PainSc: 0-No pain   Body mass index is 31 kg/m.  Wt Readings from Last 3 Encounters:  09/07/23 175 lb (79.4 kg)  06/08/23 178 lb (80.7 kg)  05/10/23 176 lb 9.6 oz (80.1 kg)    The 10-year ASCVD risk score (Arnett DK, et al., 2019) is: 7.4%   Values used to calculate the score:     Age: 63 years     Clincally relevant sex: Female     Is Non-Hispanic African American: Yes     Diabetic: Yes     Tobacco smoker: No     Systolic Blood Pressure: 110 mmHg     Is BP treated: No     HDL Cholesterol: 77 mg/dL     Total Cholesterol: 201 mg/dL  Objective:  Physical Exam Vitals and nursing note reviewed.  Constitutional:      Appearance: Normal appearance.  HENT:     Head: Normocephalic and atraumatic.   Eyes:     Extraocular Movements: Extraocular movements intact.    Cardiovascular:     Rate and Rhythm: Normal rate and regular rhythm.     Heart sounds: Normal heart sounds.  Pulmonary:     Effort: Pulmonary effort is normal.     Breath sounds: Normal breath sounds.   Musculoskeletal:     Cervical back: Normal range of motion.   Skin:    General: Skin is warm.   Neurological:     General: No focal deficit present.     Mental Status: She is alert.   Psychiatric:        Mood and Affect: Mood normal.        Behavior: Behavior normal.          Assessment And Plan:  Type 2 diabetes mellitus with obesity (HCC) Assessment & Plan: She manages diabetes with diet and exercise, reluctant to take medications due to side effect concerns. - Coordinate with referral coordinator to find alternative diabetes classes covered by her insurance. - Encourage continuation of diet and exercise for diabetes management  Orders: -     Hemoglobin A1c -     Basic metabolic panel with GFR -  Amb Referral to Nutrition and Diabetic Education  Class 1 obesity due to excess calories without serious comorbidity with body mass index (BMI) of 31.0 to 31.9 in adult Assessment & Plan: She is encouraged to aim for at least 150 minutes of exercise per week and to resume her regular exercise regimen.    Family history of heart disease Assessment & Plan: LDL is 108 mg/dL; target is <29 mg/dL due to diabetes and family history of heart disease. Discussed lipoprotein A levels to assess genetic predisposition. - Order lipoprotein A levels to assess genetic predisposition to heart disease.  Orders: -     Lipoprotein A (LPA)  Herpes zoster vaccination declined  Tetanus, diphtheria, and acellular pertussis (Tdap) vaccination declined  Return for controlled DM check-4 months.  Patient was given opportunity to ask questions. Patient verbalized understanding of the plan and was able to repeat key elements of the plan. All questions were answered to their satisfaction.    I, [name], MD, have reviewed all documentation for this visit. The documentation on 09/07/23 for the exam, diagnosis, procedures, and orders are all accurate and complete.   IF YOU HAVE BEEN REFERRED TO A SPECIALIST, IT MAY TAKE 1-2 WEEKS TO SCHEDULE/PROCESS THE REFERRAL. IF YOU HAVE NOT HEARD FROM US /SPECIALIST IN TWO WEEKS, PLEASE GIVE US  A CALL AT [phone].

## 2023-09-07 NOTE — Patient Instructions (Signed)

## 2023-09-08 ENCOUNTER — Ambulatory Visit: Payer: Self-pay | Admitting: Internal Medicine

## 2023-09-08 LAB — BASIC METABOLIC PANEL WITH GFR
BUN/Creatinine Ratio: 16 (ref 12–28)
BUN: 9 mg/dL (ref 8–27)
CO2: 21 mmol/L (ref 20–29)
Calcium: 10.2 mg/dL (ref 8.7–10.3)
Chloride: 104 mmol/L (ref 96–106)
Creatinine, Ser: 0.55 mg/dL — ABNORMAL LOW (ref 0.57–1.00)
Glucose: 109 mg/dL — ABNORMAL HIGH (ref 70–99)
Potassium: 4.7 mmol/L (ref 3.5–5.2)
Sodium: 141 mmol/L (ref 134–144)
eGFR: 104 mL/min/{1.73_m2} (ref >59)

## 2023-09-08 LAB — HEMOGLOBIN A1C
Est. average glucose Bld gHb Est-mCnc: 126 mg/dL
Hgb A1c MFr Bld: 6 % — ABNORMAL HIGH (ref 4.8–5.6)

## 2023-09-08 LAB — LIPOPROTEIN A (LPA): Lipoprotein (a): 20.2 nmol/L (ref <75.0)

## 2023-09-12 DIAGNOSIS — Z8249 Family history of ischemic heart disease and other diseases of the circulatory system: Secondary | ICD-10-CM | POA: Insufficient documentation

## 2023-09-12 NOTE — Assessment & Plan Note (Signed)
 She manages diabetes with diet and exercise, reluctant to take medications due to side effect concerns. - Coordinate with referral coordinator to find alternative diabetes classes covered by her insurance. - Encourage continuation of diet and exercise for diabetes management

## 2023-09-12 NOTE — Assessment & Plan Note (Signed)
She is encouraged to aim for at least 150 minutes of exercise per week and to resume her regular exercise regimen.

## 2023-09-12 NOTE — Assessment & Plan Note (Signed)
 LDL is 108 mg/dL; target is <29 mg/dL due to diabetes and family history of heart disease. Discussed lipoprotein A levels to assess genetic predisposition. - Order lipoprotein A levels to assess genetic predisposition to heart disease.

## 2023-10-05 IMAGING — US US FNA BIOPSY THYROID 1ST LESION
1 series · 11 of 11 positions shown · non-contrast
Comparison: U/S thyroid, 01/15/2021 and 12/22/2006. CT neck,
01/17/2007.

MEDICATIONS:
None

COMPLICATIONS:
None immediate.

INDICATION: Indeterminate thyroid nodule

EXAM:
ULTRASOUND GUIDED FINE NEEDLE ASPIRATION OF INDETERMINATE THYROID
NODULE
TECHNIQUE: Informed written consent was obtained from the patient after a
discussion of the risks, benefits and alternatives to treatment.
Questions regarding the procedure were encouraged and answered. A
timeout was performed prior to the initiation of the procedure.

[Series 1: us fna biopsy thyroid 1st lesion · 0.05mm/px · 11 acquisitions, 11 frames shown]
[im 1/11]
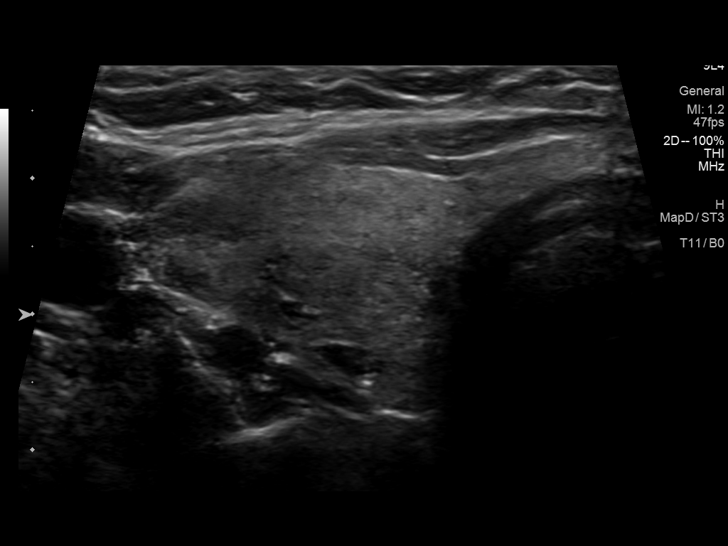
[im 2/11]
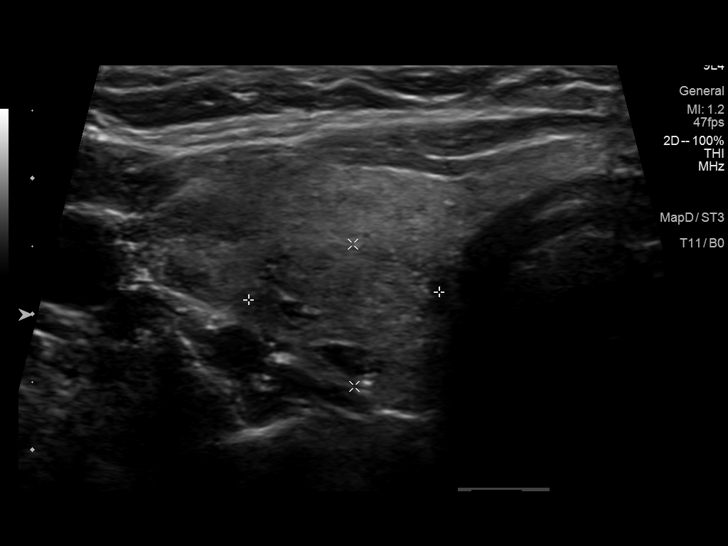
[im 3/11]
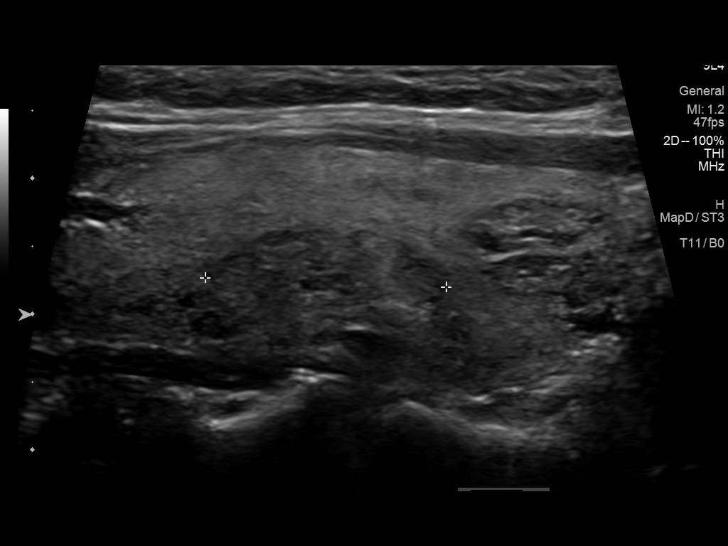
[im 4/11]
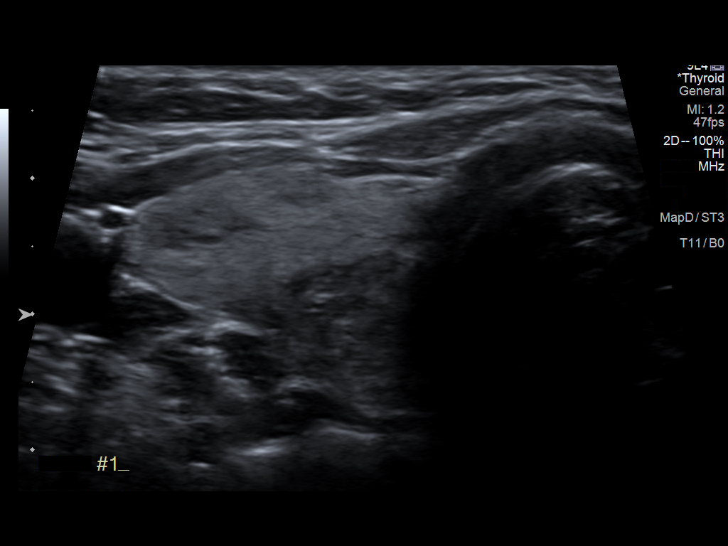
[im 5/11]
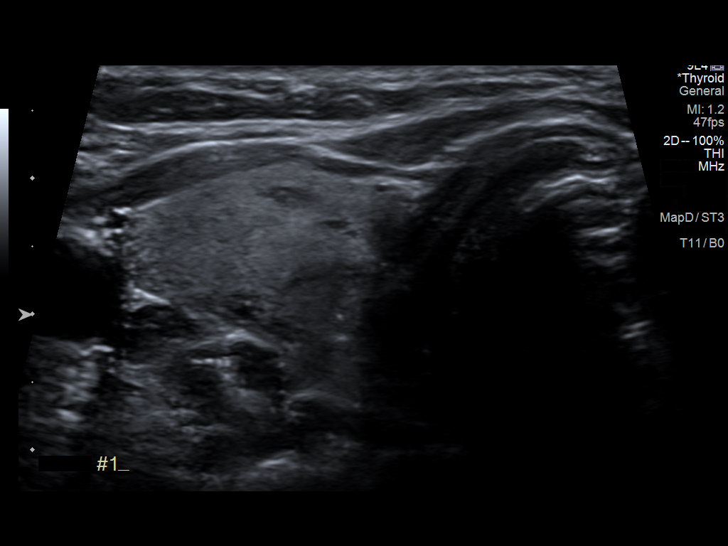
[im 6/11]
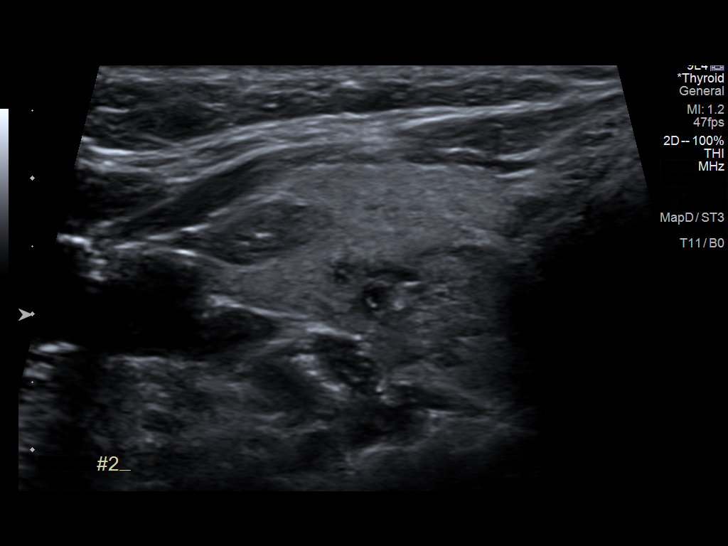
[im 7/11]
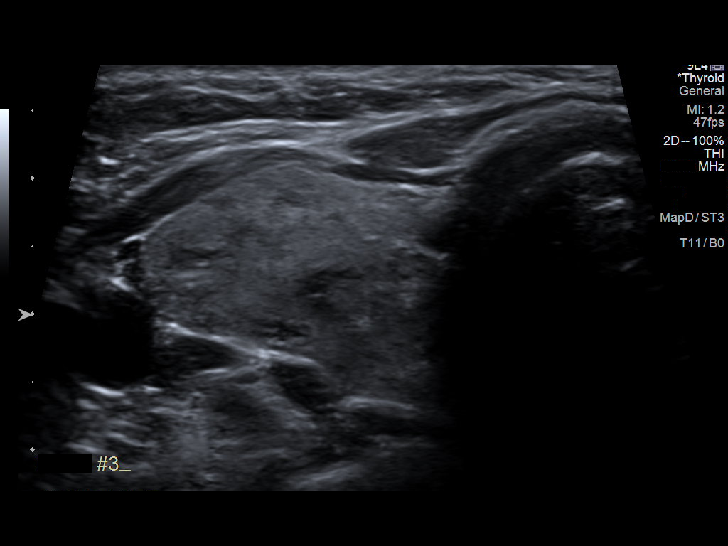
[im 8/11]
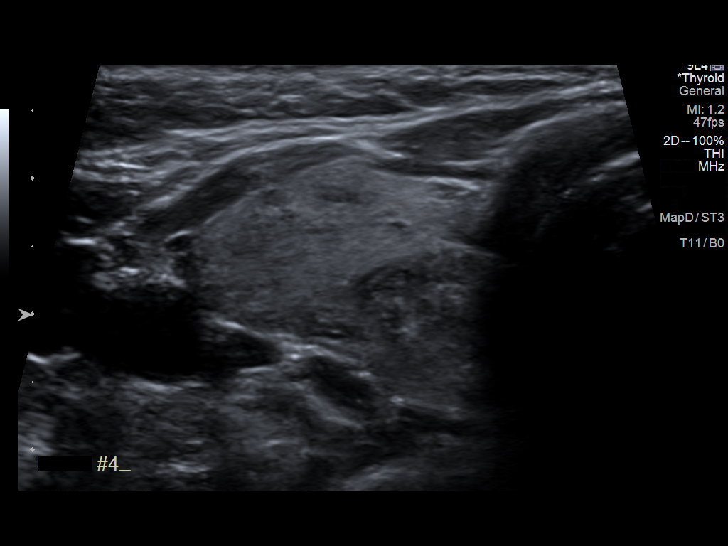
[im 9/11]
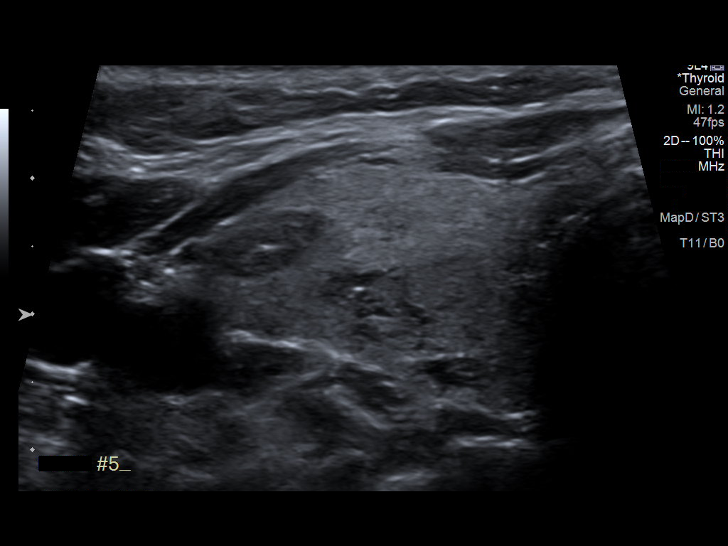
[im 10/11]
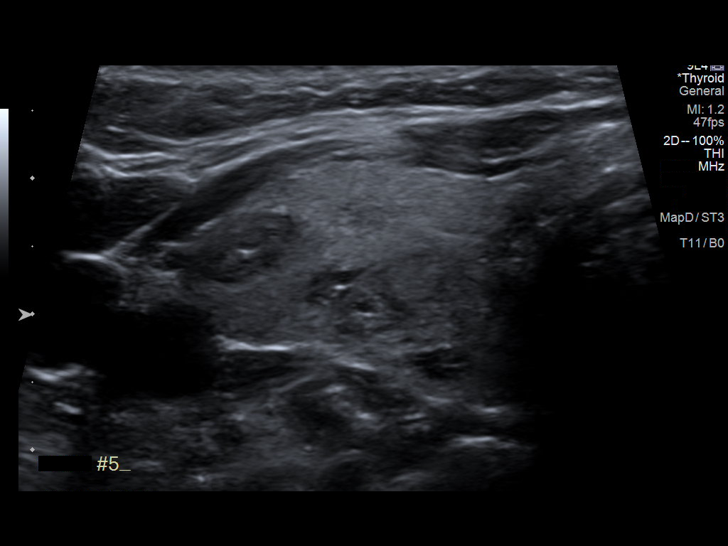
[im 11/11]
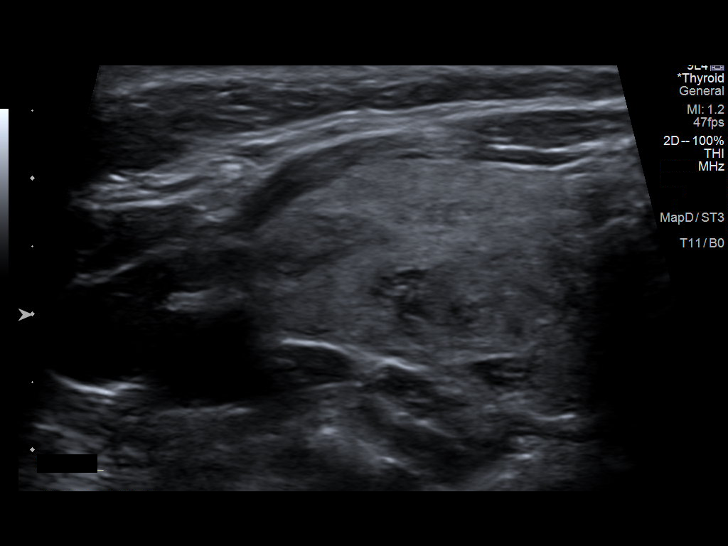

[11 of 11 positions shown; findings below may reference images not displayed]

Pre-procedural ultrasound scanning demonstrated similar size and
appearance of RIGHT mid posterior TR 4 thyroid nodule, labeled #2 on
recent comparison ultrasound.

The procedure was planned. The neck was prepped in the usual sterile
fashion, and a sterile drape was applied covering the operative
field. A timeout was performed prior to the initiation of the
procedure. Local anesthesia was provided with 1% lidocaine.

Under direct ultrasound guidance, 5 FNA biopsies were performed of
the RIGHT mid posterior thyroid nodule with a 25 gauge needle.
Samples were also submitted in [facility] for molecular analysis.
Multiple ultrasound images were saved for procedural documentation
purposes. The samples were prepared and submitted to pathology.

Limited post procedural scanning was negative for hematoma or
additional complication. Dressings were placed. The patient
tolerated the above procedures procedure well without immediate
postprocedural complication.
FINDINGS: Nodule reference number based on prior diagnostic ultrasound: # 2

Maximum size: 1.9 cm

Location: RIGHT; mid posterior gland

ACR TI-RADS risk category: TR4 (4-6 points)

Reason for biopsy: meets ACR TI-RADS criteria

Ultrasound imaging confirms appropriate placement of the needles
within the thyroid nodule.
IMPRESSION: Successful ultrasound guided fine needle aspiration (FNA) of 1.9 cm
RIGHT mid posterior TR-4 thyroid nodule.

## 2023-11-10 ENCOUNTER — Encounter: Payer: Self-pay | Admitting: Internal Medicine

## 2023-11-16 ENCOUNTER — Ambulatory Visit: Payer: Self-pay | Admitting: Internal Medicine

## 2023-11-16 ENCOUNTER — Encounter: Payer: Self-pay | Admitting: Internal Medicine

## 2023-11-16 VITALS — BP 110/80 | HR 87 | Temp 98.0°F | Ht 63.0 in | Wt 181.4 lb

## 2023-11-16 DIAGNOSIS — R600 Localized edema: Secondary | ICD-10-CM

## 2023-11-16 DIAGNOSIS — M545 Low back pain, unspecified: Secondary | ICD-10-CM | POA: Insufficient documentation

## 2023-11-16 DIAGNOSIS — M25531 Pain in right wrist: Secondary | ICD-10-CM

## 2023-11-16 DIAGNOSIS — Y93F2 Activity, caregiving, lifting: Secondary | ICD-10-CM

## 2023-11-16 NOTE — Progress Notes (Signed)
 I,[name], CMA,acting as a Neurosurgeon for [name], MD.,have documented all relevant documentation on the behalf of [name], MD,as directed by  [name], MD while in the presence of [name], MD.  Subjective:  Patient ID: Gloria Chen , female    DOB: 1960/11/06 , 63 y.o.   MRN: [id number]  Chief Complaint  Patient presents with   Medical Advice    Patient presents today requesting a doctor's note that prohibits me from lifting heavy loads. 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 There is no equipment to assist with the lifting; it is solely human power. She does not want to suffer an injury. She admits already suffering from back pain.     HPI Discussed the use of AI scribe software for clinical note transcription with the patient, who gave verbal consent to proceed.  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  She experiences back pain in the middle to lower back region, which began after lifting a wheelchair-bound student who weighs approximately as much as she does. The pain does not radiate down her legs, but she notes a sensation in her legs similar to having worked out. No sharp shooting pains or tingling in her legs are present. 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  She also has a flare-up of wrist pain, associated with a previous injury from a fall in November of last year. She denies any prior diagnosis of carpal tunnel syndrome. The wrist pain has started to hurt again, and she is concerned about potential issues with dexterity, although she currently has no problems with typing or fist movements.  She expresses concern  about the physical strain and potential for further injury.  No new problems with holding urine or bowels. No tingling in her legs during lifting, although she notes trembling due to the weight of the student.   Past Medical History:  Diagnosis Date   Allergy     Cancer (HCC)    Constipation    Edema, lower extremity    Food allergy , peanut    Lactose intolerance .   Personal history of chemotherapy    Pre-diabetes    Swelling of right ankle joint    Vitamin D  deficiency      Family History  Problem Relation Age of Onset   Hypertension Mother    Thyroid  disease Mother    Heart disease Father    Hyperlipidemia Father    Hypertension Father    Stroke Father    Diabetes Father    Hypertension Sister    Diabetes Sister    Asthma Brother    Hypertension Brother    Breast cancer Maternal Aunt      Current Outpatient Medications:    azelastine  (ASTELIN ) 0.1 % nasal spray, Place 2 sprays into both nostrils 2 (two) times daily. Use in each nostril as directed (Patient not taking: Reported on 11/16/2023), Disp: 30 mL, Rfl: 5   blood glucose meter kit and supplies KIT, Dispense based on patient and insurance preference. Use up to four times daily as directed. (Patient not taking: Reported on 11/16/2023), Disp: 1 each, Rfl: 0  diclofenac  (VOLTAREN ) 75 MG EC tablet, Take 1 tablet (75 mg total) by mouth 2 (two) times daily as needed. (Patient not taking: Reported on 11/16/2023), Disp: 60 tablet, Rfl: 2   fluticasone  (FLONASE ) 50 MCG/ACT nasal spray, Place 1 spray into both nostrils in the morning and at bedtime. (Patient not taking: Reported on 11/16/2023), Disp: 16 g, Rfl: 5   HYDROcodone  bit-homatropine (HYDROMET) 5-1.5 MG/5ML syrup, Take 5 mLs by mouth every 6 (six) hours as needed. (Patient not taking: Reported on 11/16/2023), Disp: 120 mL, Rfl: 0   levocetirizine (XYZAL ) 5 MG tablet, Take 1 tablet (5 mg total) by mouth every evening. (Patient not taking: Reported on 11/16/2023), Disp: 90 tablet,  Rfl: 1   predniSONE  (STERAPRED UNI-PAK 21 TAB) 10 MG (21) TBPK tablet, Take as directed (Patient not taking: Reported on 11/16/2023), Disp: 21 tablet, Rfl: 0   Allergies  Allergen Reactions   Almond Oil    Peanut-Containing Drug Products    Soy Allergy  (Obsolete)    Wheat      Review of Systems  Constitutional: Negative.   Respiratory: Negative.    Cardiovascular: Negative.   Gastrointestinal: Negative.   Musculoskeletal:  Positive for arthralgias and back pain.  Neurological: Negative.   Psychiatric/Behavioral: Negative.       Today's Vitals   11/16/23 1523  BP: 110/80  Pulse: 87  Temp: 98 F (36.7 C)  SpO2: 98%  Weight: 181 lb 6.4 oz (82.3 kg)  Height: 5' 3 (1.6 m)   Body mass index is 32.13 kg/m.  Wt Readings from Last 3 Encounters:  11/16/23 181 lb 6.4 oz (82.3 kg)  09/07/23 175 lb (79.4 kg)  06/08/23 178 lb (80.7 kg)     Objective:  Physical Exam Vitals and nursing note reviewed.  Constitutional:      Appearance: Normal appearance.  HENT:     Head: Normocephalic and atraumatic.  Eyes:     Extraocular Movements: Extraocular movements intact.  Cardiovascular:     Rate and Rhythm: Normal rate and regular rhythm.     Heart sounds: Normal heart sounds.  Pulmonary:     Effort: Pulmonary effort is normal.     Breath sounds: Normal breath sounds.  Musculoskeletal:     Cervical back: Normal range of motion.     Right lower leg: Edema present.     Comments: Neg straight leg test  Skin:    General: Skin is warm.  Neurological:     General: No focal deficit present.     Mental Status: She is alert.  Psychiatric:        Mood and Affect: Mood normal.        Behavior: Behavior normal.      Assessment And Plan:  Acute midline low back pain without sciatica Assessment & Plan: Low back pain localized to middle lower back, exacerbated by lifting, without radiation or bowel/bladder dysfunction. Right wrist pain flared up since previous injury, no carpal tunnel  syndrome. Lifting over 100 pounds contributes to strain. She expressed concern about exacerbating wrist pain and requested work accommodations. - Provided work note stating lifting limitations, no lifting above 25 pounds.   Right wrist pain Assessment & Plan: Possibly exacerbated by heavy lifting. Advised to wear brace/splint to work daily.    Peripheral edema Assessment & Plan: She is concerned about recurrent LE edema RLE>LLE.  I will refer her to Vascular for further evaluation as discussed.   Orders: -     Ambulatory referral to Vascular Surgery  Return if symptoms worsen or fail to improve.  Patient was given opportunity to ask questions. Patient verbalized understanding of the plan and was able to repeat key elements of the plan. All questions were answered to their satisfaction.   I, [name], MD, have reviewed all documentation for this visit. The documentation on 11/16/23 for the exam, diagnosis, procedures, and orders are all accurate and complete.   IF YOU HAVE BEEN REFERRED TO A SPECIALIST, IT MAY TAKE 1-2 WEEKS TO SCHEDULE/PROCESS THE REFERRAL. IF YOU HAVE NOT HEARD FROM US /SPECIALIST IN TWO WEEKS, PLEASE GIVE US  A CALL AT [phone].   THE PATIENT IS ENCOURAGED TO PRACTICE SOCIAL DISTANCING DUE TO THE COVID-19 PANDEMIC.

## 2023-11-16 NOTE — Patient Instructions (Signed)
 Chronic Back Pain Chronic back pain is back pain that lasts longer than 3 months. The pain may get worse at certain times (flare-ups). There are things you can do at home to manage your pain. Follow these instructions at home: Watch for any changes in your symptoms. Take these actions to help with your pain: Managing pain and stiffness     If told, put ice on the painful area. You may be told to use ice for 24-48 hours after a flare-up starts. Put ice in a plastic bag. Place a towel between your skin and the bag. Leave the ice on for 20 minutes, 2-3 times a day. If told, put heat on the painful area. Do this as often as told by your doctor. Use the heat source that your doctor recommends, such as a moist heat pack or a heating pad. Place a towel between your skin and the heat source. Leave the heat on for 20-30 minutes. If your skin turns bright red, take off the ice or heat right away to prevent skin damage. The risk of damage is higher if you cannot feel pain, heat, or cold. Soak in a warm bath. This can help with pain. Activity        Avoid bending and other activities that make the pain worse. When you stand: Keep your upper back and neck straight. Keep your shoulders pulled back. Avoid slouching. When you sit: Keep your back straight. Relax your shoulders. Do not round your shoulders or pull them backward. Do not sit or stand in one place for too long. Take short rest breaks during the day. Lying down or standing is often better than sitting. Resting can help relieve pain. When sitting or lying down for a long time, do some mild activity or stretching. This will help to prevent stiffness and pain. Get regular exercise. Ask your doctor what activities are safe for you. You may have to avoid lifting. Ask your provider how much you can safely lift. If you lift things: Bend your knees. Keep the weight close to your body. Avoid twisting. Medicines Take over-the-counter and  prescription medicines only as told by your doctor. You may need to take medicines for pain and swelling. These may be taken by mouth or put on the skin. You may also be given muscle relaxants. Ask your doctor if the medicine prescribed to you: Requires you to avoid driving or using machinery. Can cause trouble pooping (constipation). You may need to take these actions to prevent or treat trouble pooping: Drink enough fluid to keep your pee (urine) pale yellow. Take over-the-counter or prescription medicines. Eat foods that are high in fiber. These include beans, whole grains, and fresh fruits and vegetables. Limit foods that are high in fat and sugars. These include fried or sweet foods. General instructions  Sleep on a firm mattress. Try lying on your side with your knees slightly bent. If you lie on your back, put a pillow under your knees. Do not smoke or use any products that contain nicotine or tobacco. If you need help quitting, ask your doctor. Contact a doctor if: Your pain does not get better with rest or medicine. You have new pain. You have a fever. You lose weight quickly. You have trouble doing your normal activities. One or both of your legs or feet feel weak. One or both of your legs or feet lose feeling (have numbness). Get help right away if: You are not able to control when you pee  or poop. You have bad back pain and: You feel like you may vomit (nauseous). You vomit. You have pain in your chest or your belly (abdomen). You have shortness of breath. You faint. These symptoms may be an emergency. Get help right away. Call 911. Do not wait to see if the symptoms will go away. Do not drive yourself to the hospital. This information is not intended to replace advice given to you by your health care provider. Make sure you discuss any questions you have with your health care provider. Document Revised: 10/20/2021 Document Reviewed: 10/20/2021 Elsevier Patient Education   2024 [employer].

## 2023-11-21 DIAGNOSIS — R600 Localized edema: Secondary | ICD-10-CM | POA: Insufficient documentation

## 2023-11-21 NOTE — Assessment & Plan Note (Signed)
 Possibly exacerbated by heavy lifting. Advised to wear brace/splint to work daily.

## 2023-11-21 NOTE — Assessment & Plan Note (Signed)
 She is concerned about recurrent LE edema RLE>LLE.  I will refer her to Vascular for further evaluation as discussed.

## 2023-11-21 NOTE — Assessment & Plan Note (Signed)
 Low back pain localized to middle lower back, exacerbated by lifting, without radiation or bowel/bladder dysfunction. Right wrist pain flared up since previous injury, no carpal tunnel syndrome. Lifting over 100 pounds contributes to strain. She expressed concern about exacerbating wrist pain and requested work accommodations. - Provided work note stating lifting limitations, no lifting above 25 pounds.

## 2024-01-10 ENCOUNTER — Ambulatory Visit: Admitting: Internal Medicine

## 2024-01-10 ENCOUNTER — Encounter: Payer: Self-pay | Admitting: Internal Medicine

## 2024-01-10 VITALS — BP 110/60 | HR 77 | Temp 97.6°F | Ht 63.0 in | Wt 179.0 lb

## 2024-01-10 DIAGNOSIS — Z2821 Immunization not carried out because of patient refusal: Secondary | ICD-10-CM

## 2024-01-10 DIAGNOSIS — E1169 Type 2 diabetes mellitus with other specified complication: Secondary | ICD-10-CM

## 2024-01-10 DIAGNOSIS — E6609 Other obesity due to excess calories: Secondary | ICD-10-CM | POA: Diagnosis not present

## 2024-01-10 DIAGNOSIS — E66811 Obesity, class 1: Secondary | ICD-10-CM | POA: Diagnosis not present

## 2024-01-10 DIAGNOSIS — Z6831 Body mass index (BMI) 31.0-31.9, adult: Secondary | ICD-10-CM

## 2024-01-10 DIAGNOSIS — Z636 Dependent relative needing care at home: Secondary | ICD-10-CM

## 2024-01-10 DIAGNOSIS — E119 Type 2 diabetes mellitus without complications: Secondary | ICD-10-CM

## 2024-01-10 NOTE — Patient Instructions (Signed)

## 2024-01-10 NOTE — Progress Notes (Signed)
 I,Gloria Chen, CMA,acting as a neurosurgeon for Gloria LOISE Slocumb, MD.,have documented all relevant documentation on the behalf of Gloria LOISE Slocumb, MD,as directed by  Gloria LOISE Slocumb, MD while in the presence of Gloria LOISE Slocumb, MD.  Subjective:  Patient ID: Gloria Chen , female    DOB: 10-02-1960 , 63 y.o.   MRN: [id number]  Chief Complaint  Patient presents with   Diabetes    Patient presents today for a diabetes check. Patient reports compliance with her meds. Patient doesn't have any questions or concerns at this time.     HPI Discussed the use of AI scribe software for clinical note transcription with the patient, who gave verbal consent to proceed.  History of Present Illness Gloria Chen is a 63 year old female with diabetes who presents for a diabetes check.  She has not been monitoring her blood sugar levels recently and has not scheduled her annual eye exam, which was due in October. She typically goes to [facility] for this exam but is uncertain if her current insurance will be accepted there since her husband no longer works at [employer] and she was previously on his programmer, applications.  Her LDL cholesterol was last measured in February at 108 mg/dL. In 2024, a cardiac calcium score was performed, showing a score of zero.  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 She is considering retirement due to the workload.  She also spends a lot of time caring for her mother who lives in [place].    Diabetes She presents for her follow-up diabetic visit. She has type 2 diabetes mellitus. Her disease course has been stable. There are no diabetic associated symptoms. Risk factors for coronary artery disease include post-menopausal. Current diabetic treatment includes diet.     Past Medical History:  Diagnosis Date   Allergy     Cancer (HCC)    Constipation    Edema, lower extremity    Food allergy ,  peanut    Lactose intolerance .   Personal history of chemotherapy    Pre-diabetes    Swelling of right ankle joint    Vitamin D  deficiency      Family History  Problem Relation Age of Onset   Hypertension Mother    Thyroid  disease Mother    Heart disease Father    Hyperlipidemia Father    Hypertension Father    Stroke Father    Diabetes Father    Hypertension Sister    Diabetes Sister    Asthma Brother    Hypertension Brother    Breast cancer Maternal Aunt      Current Outpatient Medications:    fluticasone  (FLONASE ) 50 MCG/ACT nasal spray, Place 1 spray into both nostrils in the morning and at bedtime., Disp: 16 g, Rfl: 5   Allergies  Allergen Reactions   Almond Oil    Peanut-Containing Drug Products    Soy Allergy  (Obsolete)    Wheat      Review of Systems  Constitutional: Negative.   Respiratory: Negative.    Cardiovascular: Negative.   Gastrointestinal: Negative.   Neurological: Negative.   Psychiatric/Behavioral: Negative.       Today's Vitals   01/10/24 1216  BP: 110/60  Pulse: 77  Temp: 97.6 F (36.4 C)  TempSrc: Oral  Weight: 179 lb (81.2 kg)  Height: 5' 3 (1.6 m)  PainSc: 0-No pain   Body mass index is 31.71 kg/m.  Wt  Readings from Last 3 Encounters:  01/10/24 179 lb (81.2 kg)  11/16/23 181 lb 6.4 oz (82.3 kg)  09/07/23 175 lb (79.4 kg)     Objective:  Physical Exam Vitals and nursing note reviewed.  Constitutional:      Appearance: Normal appearance.  HENT:     Head: Normocephalic and atraumatic.  Eyes:     Extraocular Movements: Extraocular movements intact.  Cardiovascular:     Rate and Rhythm: Normal rate and regular rhythm.     Heart sounds: Normal heart sounds.  Pulmonary:     Effort: Pulmonary effort is normal.     Breath sounds: Normal breath sounds.  Musculoskeletal:     Cervical back: Normal range of motion.  Skin:    General: Skin is warm.  Neurological:     General: No focal deficit present.     Mental Status:  She is alert.  Psychiatric:        Mood and Affect: Mood normal.        Behavior: Behavior normal.         Assessment And Plan:  Type 2 diabetes mellitus without complication, without long-term current use of insulin  (HCC) Assessment & Plan: Type 2 diabetes mellitus requiring regular blood glucose monitoring. She has not been checking her levels and needs an eye exam for diabetes management. - Schedule an eye exam with an ophthalmologist and inform her of diabetes. - Monitor blood glucose levels regularly.  Orders: -     CMP14+EGFR -     Hemoglobin A1c -     Lipid panel  Caregiver stress Assessment & Plan: Will consider SW eval in the future if needed. She is also advised to get FMLA paperwork completed by her Mom's physician.    Class 1 obesity due to excess calories without serious comorbidity with body mass index (BMI) of 31.0 to 31.9 in adult Assessment & Plan: She is encouraged to aim for at least 150 minutes of exercise per week and to resume her regular exercise regimen.    Influenza vaccination declined  Herpes zoster vaccination declined   Return for controlled DM check-4 months.  Patient was given opportunity to ask questions. Patient verbalized understanding of the plan and was able to repeat key elements of the plan. All questions were answered to their satisfaction.   I, Gloria LOISE Slocumb, MD, have reviewed all documentation for this visit. The documentation on 01/10/24 for the exam, diagnosis, procedures, and orders are all accurate and complete.   IF YOU HAVE BEEN REFERRED TO A SPECIALIST, IT MAY TAKE 1-2 WEEKS TO SCHEDULE/PROCESS THE REFERRAL. IF YOU HAVE NOT HEARD FROM US /SPECIALIST IN TWO WEEKS, PLEASE GIVE US  A CALL AT [phone].   THE PATIENT IS ENCOURAGED TO PRACTICE SOCIAL DISTANCING DUE TO THE COVID-19 PANDEMIC.

## 2024-01-11 ENCOUNTER — Ambulatory Visit: Payer: Self-pay | Admitting: Internal Medicine

## 2024-01-11 LAB — CMP14+EGFR
ALT: 23 IU/L (ref 0–32)
AST: 17 IU/L (ref 0–40)
Albumin: 4.5 g/dL (ref 3.9–4.9)
Alkaline Phosphatase: 95 IU/L (ref 49–135)
BUN/Creatinine Ratio: 16 (ref 12–28)
BUN: 9 mg/dL (ref 8–27)
Bilirubin Total: 0.4 mg/dL (ref 0.0–1.2)
CO2: 22 mmol/L (ref 20–29)
Calcium: 10 mg/dL (ref 8.7–10.3)
Chloride: 102 mmol/L (ref 96–106)
Creatinine, Ser: 0.55 mg/dL — ABNORMAL LOW (ref 0.57–1.00)
Globulin, Total: 2.9 g/dL (ref 1.5–4.5)
Glucose: 124 mg/dL — ABNORMAL HIGH (ref 70–99)
Potassium: 4.4 mmol/L (ref 3.5–5.2)
Sodium: 137 mmol/L (ref 134–144)
Total Protein: 7.4 g/dL (ref 6.0–8.5)
eGFR: 104 mL/min/1.73 (ref >59)

## 2024-01-11 LAB — LIPID PANEL
Chol/HDL Ratio: 2.9 ratio (ref 0.0–4.4)
Cholesterol, Total: 197 mg/dL (ref 100–199)
HDL: 69 mg/dL (ref >39)
LDL Chol Calc (NIH): 112 mg/dL — ABNORMAL HIGH (ref 0–99)
Triglycerides: 92 mg/dL (ref 0–149)
VLDL Cholesterol Cal: 16 mg/dL (ref 5–40)

## 2024-01-11 LAB — HEMOGLOBIN A1C
Est. average glucose Bld gHb Est-mCnc: 151 mg/dL
Hgb A1c MFr Bld: 6.9 % — ABNORMAL HIGH (ref 4.8–5.6)

## 2024-01-13 DIAGNOSIS — Z636 Dependent relative needing care at home: Secondary | ICD-10-CM | POA: Insufficient documentation

## 2024-01-13 NOTE — Assessment & Plan Note (Signed)
She is encouraged to aim for at least 150 minutes of exercise per week and to resume her regular exercise regimen.

## 2024-01-13 NOTE — Assessment & Plan Note (Addendum)
 Will consider SW eval in the future if needed. She is also advised to get FMLA paperwork completed by her Mom's physician.

## 2024-01-13 NOTE — Assessment & Plan Note (Signed)
 Type 2 diabetes mellitus requiring regular blood glucose monitoring. She has not been checking her levels and needs an eye exam for diabetes management. - Schedule an eye exam with an ophthalmologist and inform her of diabetes. - Monitor blood glucose levels regularly.

## 2024-01-17 ENCOUNTER — Encounter: Payer: Self-pay | Admitting: Radiology

## 2024-01-20 ENCOUNTER — Encounter

## 2024-02-02 LAB — OPHTHALMOLOGY REPORT-SCANNED

## 2024-02-18 ENCOUNTER — Ambulatory Visit: Payer: BC Managed Care – PPO | Admitting: Internal Medicine

## 2024-02-18 ENCOUNTER — Encounter: Payer: Self-pay | Admitting: Internal Medicine

## 2024-02-18 VITALS — BP 124/70 | HR 84 | Ht 63.0 in | Wt 179.2 lb

## 2024-02-18 DIAGNOSIS — E119 Type 2 diabetes mellitus without complications: Secondary | ICD-10-CM | POA: Diagnosis not present

## 2024-02-18 DIAGNOSIS — E042 Nontoxic multinodular goiter: Secondary | ICD-10-CM | POA: Diagnosis not present

## 2024-02-18 NOTE — Patient Instructions (Addendum)
We will schedule a new thyroid U/S.  Please come back for a follow-up appointment in 1 year. 

## 2024-02-18 NOTE — Progress Notes (Signed)
 Patient ID: Gloria Chen, female   DOB: 24-Dec-1960, 63 y.o.   MRN: [id number]  HPI  Gloria Chen is a 63 y.o.-year-old female, initially referred by her PCP, Dr. Jarold, returning for follow-up for thyroid  nodules.  Last visit 1 year ago.  Interim history: She feels well since last visit without complaints today. No neck discomfort or problems swallowing.  She does feel that the right side of her neck is larger. She recently had a higher HbA1c of 6.9% she mentions that this was a wake-up call for her and she started to improve her diet and started to exercise.  Reviewed and addended history: Patient has a long history of multiple thyroid  nodules - found by palpation by her ObGyn provider in the past.  She was found to have fullness in her neck again on palpation this year (Dr. Jon Rummer) and then saw PCP (Dr. Catheryn Jarold) >> a new thyroid  U/S was ordered >> she had multiple small thyroid  nodules but also a new, larger, dominant nodule >> Bx: inconclusive. She was referred to endocrinology.  Thyroid  U/S (11/14/2003):  Ultrasound of the thyroid  gland was performed.  The right lobe measures 6.0 x 1.9 x 2.3 cm.  The left lobe measures 5.8 x 1.5 x 1.6 cm.  Thyroid  isthmus is 4-5 mm in thickness.    Overall the thyroid  parenchymal echotexture is homogeneous.  There are three discrete noncystic lesions identified, one in the lateral aspect of the right lobe measuring 9 mm, a more central lesion in the medial aspect of the right lobe measuring 8 mm, and a medial left lobe hypoechoic nodule measuring 7 mm.    IMPRESSION  Three tiny hypoechoic nodules in the thyroid  parenchyma are most likely benign, but ultrasound features are nonspecific. A followup ultrasound in three to six months may be helpful to ensure that they remain stable.   Thyroid  U/S (03/24/2004): The right thyroid  lobe measures 5.5 x 1.8 x 2.0  The left lobe measures 5.2 x 1.4 x 1.5 cm.  The isthmus measures .36 cm.  The  overall echogenicity of the thyroid  gland is homogeneous.  There are multiple bilateral thyroid  nodules. These appear stable. No dominant worrisome nodule is seen.    IMPRESSION:  1. Multinodular goiter. No change in multiple small thyroid  nodules. Recommend sonographic surveillance with a followup study in one year.    Thyroid  U/S (12/22/2006): The right thyroid  lobe measures 4.4 x 1.7 x 1.7 cm.   The left lobe measures 4.8 x 1.4 x 1.6 cm.   The isthmus measures 3 mm in thickness.   The patient has multiple bilateral thyroid  nodules ranging in size from about 4 mm  to 9 mm. There is no substantial interval change.  No definite progressive  intrathyroid nodule is identified.     Cranial to the right thyroid  lobe, the sonographer has identified a 2.2 x 1.1 cm  complex cystic lesion which demonstrates peripheral blood flow.     IMPRESSION:  Numerous bilateral thyroid  nodules compatible with multinodular goiter. There is  no substantial interval change in appearance of the thyroid  gland.     New 2.2 cm complex cystic lesion in the soft tissues just cranial to the right  thyroid  lobe near the midline. A necrotic lymph node could have this appearance.  Thyroglossal duct cyst would also be a consideration. Clinical correlation is  recommended and CT neck with contrast is suggested to further evaluate.   CT neck (01/17/2007):  There are findings of  a multinodular goiter.  Anterior to the superior pole of the right thyroid  lobe, there is a 0.6 x 1.4 x 1.6 cm focus which is compatible with a pyramidal lobe of the thyroid  gland.  Within the pyramidal lobe, there is a 3 x 8 x 10 mm fluid-filled focus which I feel represents the residua of the mass noted on ultrasound.  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  The small low density focus is circumscribed by tissue which has an identical appearance to the thyroid  gland.  There is a  small linear extension off of the pyramidal lobe extending superiorly towards the hyoid bone.  The focus is causing focal anterior displacement of the right strap muscles.  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   Thyroid  U/S (01/15/2021): Parenchymal Echotexture: Moderately heterogenous  Isthmus: Normal in size measuring 0.5 cm in diameter  Right lobe: Borderline enlarged measuring 5.2 x 2.0 x 2.1 cm, previously, 4.4 x 1.7 x 1.7  Left lobe: Normal in size measuring 4.8 x 1.4 x 1.6 cm, previously, 4.8 x 1.4 x 1.6 cm _________________________________________________________   Estimated total number of nodules >/= 1 cm: 6-10 _________________________________________________________   There is an approximately 1.1 x 0.9 x 0.4 cm hypoechoic nodule within mid aspect of the right lobe of the thyroid  (labeled 1), which is unchanged compared to the 12/2006 examination, previously, 1.0 cm. Imaging stability for greater than 5 years is indicative benign etiology. _________________________________________________________   Nodule # 2:  Location: Right; Mid (not seen on the 12/2006 examination)  Maximum size: 1.9 cm; Other 2 dimensions: 1.3 x 1.0 cm  Composition: solid/almost completely solid (2)  Echogenicity: hypoechoic (2)  **Given size (>/= 1.5 cm) and appearance, fine needle aspiration of this moderately suspicious nodule should be considered based on TI-RADS criteria.  _________________________________________________________   There is an approximately 1.5 x 1.0 x 0.7 cm anechoic cyst within mid aspect the right lobe of the thyroid  (labeled 3) which has reduced in size compared to the 2008 examination, previously, 2.2 x 1.7 x 1.1 cm, and again does not meet criteria to recommend percutaneous sampling or continued dedicated  follow-up.   There is an approximately 1.2 x 1.0 x 0.4 cm spongiform/benign-appearing nodule within the mid, medial aspect of the right lobe of the thyroid  (labeled 4), which is unchanged to slightly increased in size compared to the 2008 examination, previously, 0.8 cm, however again does not meet criteria to recommend percutaneous sampling or continued dedicated follow-up.  _________________________________________________________   There is an approximately 1.2 x 1 x 1 x 0.7 cm spongiform/benign-appearing nodule within the superior pole of the left lobe of the thyroid  (labeled 5) which has increased in size compared to the 12/2006 examination for previously, 0.6 cm, however does not meet criteria to recommend percutaneous sampling or continued dedicated follow-up given its spongiform/benign appearance.   There is a punctate (approximately 0.7 cm spongiform/benign-appearing nodule within mid aspect the left lobe of the thyroid  (labeled 6), which does not meet criteria to recommend percutaneous sampling or continued dedicated follow-up.   There is an approximately 1.2 x 1.0 x 0.6 cm spongiform/benign-appearing nodule within the mid aspect the left lobe of the thyroid  (labeled 7), which has increased in size compared to the 12/2006 examination heart previously,  0.6 cm, however does not meet criteria to recommend percutaneous sampling or continued dedicated follow-up given its spongiform/benign appearance.   There is an approximately 0.7 cm spongiform/benign-appearing nodule within the inferior pole the left lobe of the thyroid  (labeled 8), not seen on the 2008 examination though does not meet criteria to recommend percutaneous sampling or continued dedicated follow-up.   IMPRESSION: 1. Borderline thyromegaly with findings suggestive of multinodular goiter. 2. Nodule #2, not seen on the 2008 examination, meets imaging criteria to recommend percutaneous sampling as indicated. 3.  None of the remaining thyroid  nodules, the majority of which appear spongiform/benign meet imaging criteria to recommend percutaneous sampling or continued dedicated follow-up.  FNA of the right dominant thyroid  nodule (02/11/2021): A. 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 (TC:tc)  Smears:6  Concentration Method (ThinPrep): 1  Cell Block: Cell block attempted but not obtained.  Additional Studies: Afirma Collected.   Afirma results returned benign (risk of malignancy approximately 4%).    Thyroid  ultrasound (04/16/2022): Parenchymal Echotexture: Moderately heterogenous  Isthmus: 0.3 cm  Right lobe: 5.2 cm x 2.0 cm x 2.4 cm  Left lobe: 4.8 cm x 1.3 cm x 1.6 cm  _________________________________________________________   Estimated total number of nodules >/= 1 cm: 6-10 _________________________________________________________   Nodule labeled 1 superior right thyroid , 1.0 cm, previously 1.1 cm. Nodule has remained stable for greater than 5 years and discontinuing surveillance is reasonable.   Nodule labeled 2, mid right thyroid  (previously 3), 1.8 cm with cystic characteristics. Nodule does not meet criteria for surveillance.   Nodule labeled 3, mid right thyroid  (previously 2 and previously biopsied), 1.6 cm. Assuming a prior negative result on biopsy, no further specific follow-up is indicated.   Nodule labeled 4, inferior right thyroid , 1.1 cm. Nodule remains unchanged greater than 5 years and does not meet criteria for further surveillance.   Nodule labeled 5, inferior right thyroid , 1.1 cm. Nodule has spongiform characteristics on prior ultrasound and remains unchanged for greater than 5  years. Nodule does not meet criteria for surveillance.   Nodule labeled 6, mid left thyroid , 1.2 cm. Nodule is unchanged in size with spongiform characteristics and does not meet criteria for surveillance.   Nodule labeled 7, inferior left thyroid , 1.3 cm. Nodule has spongiform characteristics on previous ultrasound and current and remains unchanged over 5 years. Nodule does not meet criteria for surveillance.   No adenopathy.   IMPRESSION: Multinodular thyroid  is unchanged, as above.   Thyroid  ultrasound (04/28/2023): Parenchymal Echotexture: Mildly heterogenous  Isthmus: 0.3 cm  Right lobe: 5.9 x 2.1 x 2.4 cm  Left lobe: 4.6 x 1.7 x 1.3 cm  _________________________________________________________   Estimated total number of nodules >/= 1 cm: 0.3 cm   Number of spongiform nodules >/= 2 cm not described below (TR1): 5.9 x 2.1 x 2.4 cm   Number of mixed cystic and solid nodules >/= 1.5 cm not described below (TR2): 4.6 x 1.7 x 1.3 cm  _________________________________________________________   Nodule # 2: Previously biopsied nodule in the right mid gland is similar in size at 1.8 x 1.6 x 1.5 cm compared to 1.6 x 0.7 x 1.3 cm.   Additional small thyroid  nodules remain stable. A simple colloid cyst in  the right mid gland is slightly larger at 2 x 0.9 x 1.6 cm but remains sonographically benign.   No new nodules or suspicious features.   IMPRESSION: Stable appearance of the thyroid  gland including the previously biopsied nodule in the right mid gland.   Pt denies: - feeling nodules in neck - hoarseness - dysphagia - choking  I reviewed pt's thyroid  tests - normal: Lab Results  Component Value Date   TSH 1.220 12/21/2022   TSH 0.668 04/16/2022   TSH 0.75 02/18/2021   TSH 1.260 02/01/2019   FREET4 1.48 12/21/2022   FREET4 1.47 02/01/2019    + FH of thyroid  ds. - in mother: on LT4 (possibly ThyCA). No h/o radiation tx to head or neck. No steroid use. No herbal  supplements. No Biotin supplements or Hair, Skin and Nails vitamins.   Pt also has a history of BrCA - in remission, prediabetes.  ROS: + See HPI  Past Medical History:  Diagnosis Date   Allergy     Cancer (HCC)    Constipation    Edema, lower extremity    Food allergy , peanut    Lactose intolerance .   Personal history of chemotherapy    Pre-diabetes    Swelling of right ankle joint    Vitamin D  deficiency    Past Surgical History:  Procedure Laterality Date   BREAST SURGERY     MASTECTOMY Left 1997   Social History   Socioeconomic History   Marital status: Married    Spouse name: Arley   Number of children: 1   Years of education: Not on file   Highest education level: Not on file  Occupational History   Occupation: runner, broadcasting/film/video - [employer]  Tobacco Use   Smoking status: Never    Passive exposure: Never   Smokeless tobacco: Never  Vaping Use   Vaping status: Never Used  Substance and Sexual Activity   Alcohol use: No   Drug use: No   Sexual activity: Yes    Partners: Male    Birth control/protection: Post-menopausal  Other Topics Concern   Not on file  Social History Narrative   Not on file   Social Drivers of Health   Financial Resource Strain: Not on file  Food Insecurity: Not on file  Transportation Needs: Not on file  Physical Activity: Not on file  Stress: Not on file  Social Connections: Not on file  Intimate Partner Violence: Not on file   Current Outpatient Medications on File Prior to Visit  Medication Sig Dispense Refill   fluticasone  (FLONASE ) 50 MCG/ACT nasal spray Place 1 spray into both nostrils in the morning and at bedtime. 16 g 5   No current facility-administered medications on file prior to visit.   Allergies  Allergen Reactions   Almond Oil    Peanut-Containing Drug Products    Soy Allergy  (Obsolete)    Wheat    Family History  Problem Relation Age of Onset   Hypertension Mother    Thyroid  disease Mother    Heart disease  Father    Hyperlipidemia Father    Hypertension Father    Stroke Father    Diabetes Father    Hypertension Sister    Diabetes Sister    Asthma Brother    Hypertension Brother    Breast cancer Maternal Aunt    PE: BP 124/70   Pulse 84   Ht 5' 3 (1.6 m)   Wt 179 lb 3.2 oz (81.3 kg)   LMP 03/16/2000  SpO2 97%   BMI 31.74 kg/m  Wt Readings from Last 3 Encounters:  02/18/24 179 lb 3.2 oz (81.3 kg)  01/10/24 179 lb (81.2 kg)  11/16/23 181 lb 6.4 oz (82.3 kg)   Constitutional: overweight, in NAD Eyes: EOMI, no exophthalmos ENT: + Mild fulness R lower cervical region, no thyroid  nodules palpated, no cervical lymphadenopathy Cardiovascular: RRR, No MRG Respiratory: CTA B Musculoskeletal: no deformities Skin: no rashes Neurological: no tremor with outstretched hands  ASSESSMENT: 1. Thyroid  nodules  2.  Type 2 diabetes-reviewed diagnosis  PLAN: 1. Thyroid  nodules - Patient with history of thyroid  nodules were reviewed the images and the report of her thyroid  ultrasounds going back to 2005.  The dominant nodules were not increasing in size and they did not have concerning features with the exception of the right 1.9 cm thyroid  nodule, which appears to be hypoechoic.  She does not have a family history of thyroid  cancer or personal history of radiation therapy to head or neck to increase her own risk of cancer. -Also, she had a biopsy of the dominant right thyroid  nodule and this showed Hurthle cell lesion or neoplasm.  We discussed about the difference between the 2 entities.  In her case, this was classified as Bethesda category 4 and I explained that this was an indeterminant result, with a risk of cancer closer to 15-30%.  However, the Afirma molecular marker returned benign, which was reassuring (<4% risk of thyroid  cancer).  We repeated a thyroid  ultrasound in 04/2022 and this was unchanged. -We discussed that thyroid  cancer has a good prognosis with an indolent course.  Hurthle  cell cancer, in particular, has an increased propensity for vascular metastasis compared to the regular thyroid  cancer so we have to keep a closer eye on her nodules.  We checked another ultrasound on 04/28/2023 which showed stable nodules. -She continues to have fullness in the right lower cervical area, but less prominent than before.  She does not have neck compression symptoms - We discussed about repeating a thyroid  ultrasound in a year, at our next visit, however, she feels that the right side of her neck is larger than the left and she would like to have another ultrasound now to make sure this is not increased in size since last visit.  We can check another ultrasound now and then reduce the frequency. - Plan to see her back in a year  2.  Type 2 diabetes-new diagnosis - Recent diagnosis after she had an HbA1c of 6.9%, the second value above 6.5% - She mentions that this was a wake-up call for her and she started to improve her diet and also started exercise - I recommended the book  Program for reversing diabetes by Dr. Aureliano Punches and discussed about the benefits of a plant-based diet. - she we will continue to follow-up with PCP for this  Orders Placed This Encounter  Procedures   US  THYROID    Lela Fendt, MD PhD [facility]

## 2024-02-21 ENCOUNTER — Other Ambulatory Visit: Payer: Self-pay

## 2024-02-21 DIAGNOSIS — I872 Venous insufficiency (chronic) (peripheral): Secondary | ICD-10-CM

## 2024-03-28 ENCOUNTER — Encounter: Payer: Self-pay | Admitting: Vascular Surgery

## 2024-03-28 ENCOUNTER — Ambulatory Visit: Admitting: Vascular Surgery

## 2024-03-28 ENCOUNTER — Ambulatory Visit (HOSPITAL_COMMUNITY)
Admission: RE | Admit: 2024-03-28 | Discharge: 2024-03-28 | Disposition: A | Discharge status: Home / Self Care | Source: Ambulatory Visit | Attending: Vascular Surgery | Admitting: Vascular Surgery

## 2024-03-28 VITALS — BP 124/88 | HR 77 | Temp 97.8°F | Resp 16 | Ht 63.0 in | Wt 174.9 lb

## 2024-03-28 DIAGNOSIS — I872 Venous insufficiency (chronic) (peripheral): Secondary | ICD-10-CM | POA: Diagnosis not present

## 2024-03-28 DIAGNOSIS — R600 Localized edema: Secondary | ICD-10-CM | POA: Insufficient documentation

## 2024-03-28 NOTE — Progress Notes (Signed)
 "   Patient name: Gloria Chen MRN: [id number] DOB: [birth year] Sex: female  REASON FOR CONSULT: Evaluate for chronic venous insufficiency  HPI: Gloria Chen is a 64 y.o. female, that presents for evaluation of chronic venous insufficiency.  Reports swelling to her lower extremities around the ankles for years.  No prior history of DVT.  No prior venous interventions.  Is wearing 15-20 mm Hg  knee-high compression today.  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.  Past Medical History:  Diagnosis Date   Allergy     Cancer (HCC)    Constipation    Edema, lower extremity    Food allergy , peanut    Lactose intolerance .   Personal history of chemotherapy    Pre-diabetes    Swelling of right ankle joint    Vitamin D  deficiency     Past Surgical History:  Procedure Laterality Date   BREAST SURGERY     MASTECTOMY Left 1997    Family History  Problem Relation Age of Onset   Hypertension Mother    Thyroid  disease Mother    Heart disease Father    Hyperlipidemia Father    Hypertension Father    Stroke Father    Diabetes Father    Hypertension Sister    Diabetes Sister    Asthma Brother    Hypertension Brother    Breast cancer Maternal Aunt     SOCIAL HISTORY: Social History   Socioeconomic History   Marital status: Married    Spouse name: Arley   Number of children: 1   Years of education: Not on file   Highest education level: Not on file  Occupational History   Occupation: runner, broadcasting/film/video - [employer]  Tobacco Use   Smoking status: Never    Passive exposure: Never   Smokeless tobacco: Never  Vaping Use   Vaping status: Never Used  Substance and Sexual Activity   Alcohol use: No   Drug use: No   Sexual activity: Yes    Partners: Male    Birth control/protection: Post-menopausal  Other Topics Concern   Not on file  Social History Narrative   Not on file   Social Drivers of Health   Tobacco Use: Low Risk (03/28/2024)   Patient History    Smoking  Tobacco Use: Never    Smokeless Tobacco Use: Never    Passive Exposure: Never  Financial Resource Strain: Not on file  Food Insecurity: Not on file  Transportation Needs: Not on file  Physical Activity: Not on file  Stress: Not on file  Social Connections: Not on file  Intimate Partner Violence: Not on file  Depression (PHQ2-9): Low Risk (01/10/2024)   Depression (PHQ2-9)    PHQ-2 Score: 0  Alcohol Screen: Not on file  Housing: Not on file  Utilities: Not on file  Health Literacy: Not on file    Allergies[1]  Current Outpatient Medications  Medication Sig Dispense Refill   fluticasone  (FLONASE ) 50 MCG/ACT nasal spray Place 1 spray into both nostrils in the morning and at bedtime. 16 g 5   No current facility-administered medications for this visit.    REVIEW OF SYSTEMS:  [X]  denotes positive finding, [ ]  denotes negative finding Cardiac  Comments:  Chest pain or chest pressure:    Shortness of breath upon exertion:    Short of breath when lying flat:    Irregular heart rhythm:        Vascular    Pain in calf, thigh,  or hip brought on by ambulation:    Pain in feet at night that wakes you up from your sleep:     Blood clot in your veins:    Leg swelling:  x       Pulmonary    Oxygen at home:    Productive cough:     Wheezing:         Neurologic    Sudden weakness in arms or legs:     Sudden numbness in arms or legs:     Sudden onset of difficulty speaking or slurred speech:    Temporary loss of vision in one eye:     Problems with dizziness:         Gastrointestinal    Blood in stool:     Vomited blood:         Genitourinary    Burning when urinating:     Blood in urine:        Psychiatric    Major depression:         Hematologic    Bleeding problems:    Problems with blood clotting too easily:        Skin    Rashes or ulcers:        Constitutional    Fever or chills:      PHYSICAL EXAM: Vitals:   03/28/24 1516  BP: 124/88  Pulse: 77   Resp: 16  Temp: 97.8 F (36.6 C)  TempSrc: Temporal  SpO2: 96%  Weight: 174 lb 14.4 oz (79.3 kg)  Height: 5' 3 (1.6 m)    GENERAL: The patient is a well-nourished female, in no acute distress. The vital signs are documented above. CARDIAC: There is a regular rate and rhythm.  VASCULAR:  Bilateral PT pulses palpable Lower extremity spider veins PULMONARY: No respiratory distress. ABDOMEN: Soft and non-tender  MUSCULOSKELETAL: There are no major deformities or cyanosis. NEUROLOGIC: No focal weakness or paresthesias are detected. SKIN: There are no ulcers or rashes noted. PSYCHIATRIC: The patient has a normal affect.  DATA:   Lower Venous Reflux Study   Patient Name:  Gloria Chen  Date of Exam:   03/28/2024  Medical Rec #: [id number]     Accession #:    [id number]  Date of Birth: Jul 30, 1960     Patient Gender: F  Patient Age:   64 years  Exam Location:  [address]  Procedure:      VAS US  LOWER EXTREMITY VENOUS REFLUX  Referring Phys: LONNI GASKINS    ---------------------------------------------------------------------------  -----    Indications: Chronic venous insufficiency.    Comparison Study: N/A   Performing Technologist: Dena Pane     Examination Guidelines: A complete evaluation includes B-mode imaging,  spectral  Doppler, color Doppler, and power Doppler as needed of all accessible  portions  of each vessel. Bilateral testing is considered an integral part of a  complete  examination. Limited examinations for reoccurring indications may be  performed  as noted. The reflux portion of the exam is performed with the patient in  reverse Trendelenburg.  Significant venous reflux is defined as >500 ms in the superficial venous  system, and >1 second in the deep venous system.     +--------------+---------+------+-----------+------------+--------+  LEFT         Reflux NoRefluxReflux TimeDiameter cmsComments                           Yes                                   +--------------+---------+------+-----------+------------+--------+  CFV          no                                              +--------------+---------+------+-----------+------------+--------+  FV prox       no                                              +--------------+---------+------+-----------+------------+--------+  FV mid        no                                              +--------------+---------+------+-----------+------------+--------+  FV dist       no                                              +--------------+---------+------+-----------+------------+--------+  Popliteal    no                                              +--------------+---------+------+-----------+------------+--------+  GSV at [facility]    no                            0.68              +--------------+---------+------+-----------+------------+--------+  GSV prox thighno                            0.42              +--------------+---------+------+-----------+------------+--------+  GSV mid thigh no                            0.24              +--------------+---------+------+-----------+------------+--------+  GSV dist thighno                            0.31              +--------------+---------+------+-----------+------------+--------+  GSV at knee   no                            0.23              +--------------+---------+------+-----------+------------+--------+  GSV prox calf no                            0.18              +--------------+---------+------+-----------+------------+--------+  GSV mid calf  no                            0.12              +--------------+---------+------+-----------+------------+--------+  GSV dist calf no                            0.19              +--------------+---------+------+-----------+------------+--------+  SSV prox calf no                             0.16              +--------------+---------+------+-----------+------------+--------+  SSV mid calf  no                            0.13              +--------------+---------+------+-----------+------------+--------+     Summary:  Left:  - No evidence of deep vein thrombosis seen in the left lower extremity,  from the common femoral through the popliteal veins.  - No evidence of superficial venous thrombosis in the left lower  extremity.  - There is no evidence of venous reflux seen in the left lower extremity.  - No evidence of superficial venous reflux seen in the left greater  saphenous vein.  - No evidence of superficial venous reflux seen in the left short  saphenous vein.    *See table(s) above for measurements and observations.   Electronically signed by Lonni Gaskins MD on 03/28/2024 at 3:57:54 PM.   Assessment/Plan:   64 y.o. female, that presents for evaluation of chronic venous insufficiency.  Reports swelling to her lower extremities around the ankles for years.    I discussed that her reflux study today does not show any evidence of DVT and no significant reflux in the left lower extremity which was the leg studied.  Certainly her edema could can multifactorial but does not appear venous disease is a significant factor.  I discussed for leg swelling can manage this with leg elevation, exercise, maintaining healthy weight, and compression stockings are all helpful.  No further workup or intervention necessary from a venous standpoint.  She does have spider veins on exam and discussed these could we treated with sclerotherapy but would be purely elective.  She can follow-up with us  as needed.  Palpable pedal pulses and no signs of arterial insufficiency.   Lonni DOROTHA Gaskins, MD [facility] [place] Office: [phone]        [1]  Allergies Allergen Reactions   Almond Oil    Peanut-Containing Drug  Products    Soy Allergy  (Obsolete)    Wheat    "

## 2024-04-10 ENCOUNTER — Encounter: Payer: Self-pay | Admitting: Internal Medicine

## 2024-04-11 ENCOUNTER — Other Ambulatory Visit: Payer: Self-pay | Admitting: Internal Medicine

## 2024-04-11 DIAGNOSIS — E042 Nontoxic multinodular goiter: Secondary | ICD-10-CM

## 2024-04-24 ENCOUNTER — Other Ambulatory Visit

## 2024-05-15 ENCOUNTER — Ambulatory Visit: Admitting: Internal Medicine

## 2024-05-16 ENCOUNTER — Encounter: Payer: 59 | Admitting: Internal Medicine
